# Patient Record
Sex: Female | Born: 1990 | Hispanic: Yes | Smoking: Never smoker
Health system: Southern US, Community
[De-identification: ages and names within clinical notes are randomized; demographics above are authoritative.]

## PROBLEM LIST (undated history)

## (undated) DIAGNOSIS — F419 Anxiety disorder, unspecified: Secondary | ICD-10-CM

## (undated) DIAGNOSIS — D649 Anemia, unspecified: Secondary | ICD-10-CM

## (undated) HISTORY — DX: Anemia, unspecified: D64.9

## (undated) HISTORY — DX: Anxiety disorder, unspecified: F41.9

---
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
## 2012-12-01 ENCOUNTER — Observation Stay: Payer: Self-pay | Admitting: Obstetrics and Gynecology

## 2012-12-01 LAB — URINALYSIS, COMPLETE
Bilirubin,UR: NEGATIVE
Blood: NEGATIVE
Glucose,UR: NEGATIVE mg/dL (ref 0–75)
Nitrite: NEGATIVE
Ph: 7 (ref 4.5–8.0)
Specific Gravity: 1.011 (ref 1.003–1.030)
Squamous Epithelial: 5
WBC UR: 4 /HPF (ref 0–5)

## 2012-12-03 ENCOUNTER — Observation Stay: Payer: Self-pay | Admitting: Obstetrics and Gynecology

## 2012-12-03 LAB — URINALYSIS, COMPLETE
Bilirubin,UR: NEGATIVE
Blood: NEGATIVE
Glucose,UR: NEGATIVE mg/dL (ref 0–75)
Leukocyte Esterase: NEGATIVE
Ph: 6 (ref 4.5–8.0)
Protein: 30
RBC,UR: 1 /HPF (ref 0–5)
Specific Gravity: 1.015 (ref 1.003–1.030)
Squamous Epithelial: 1
WBC UR: 2 /HPF (ref 0–5)

## 2012-12-03 IMAGING — US US RENAL KIDNEY
1 series · 14 of 25 positions shown · non-contrast
Comparison: none

REASON FOR EXAM: flank pain 36 weeks pregnant
COMMENTS:

[Series 1: us renal kidney · 0.18mm/px · 32 acquisitions, 14 frames shown]
[im 1/32]
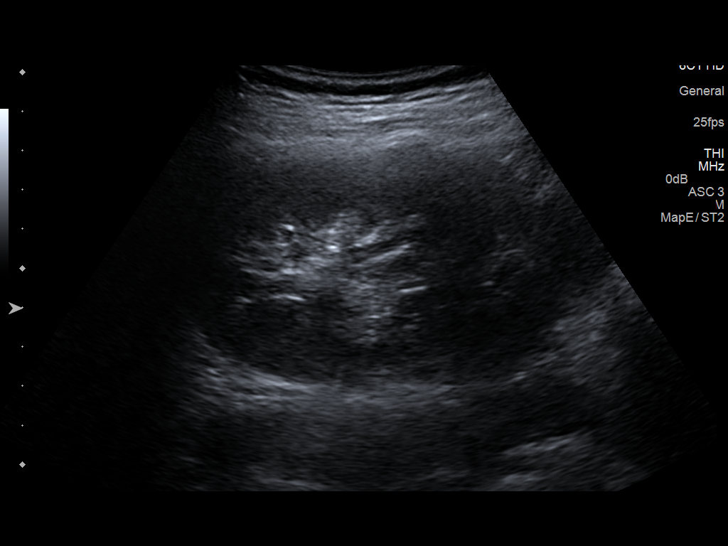
[im 3/32]
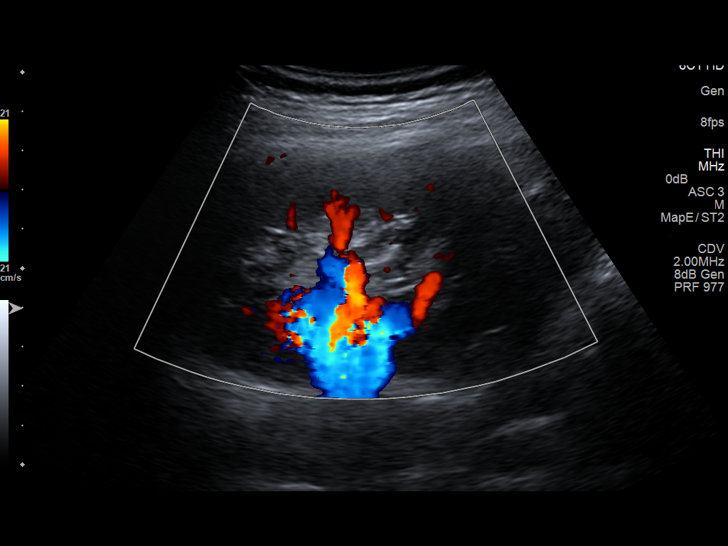
[im 6/32]
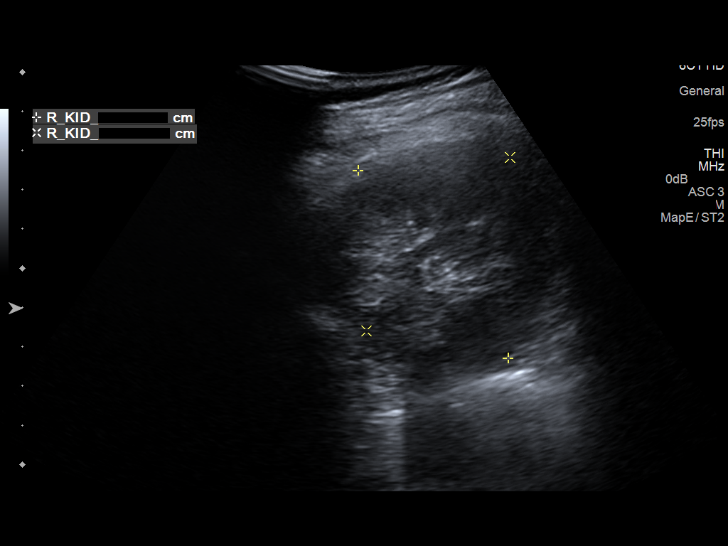
[im 8/32]
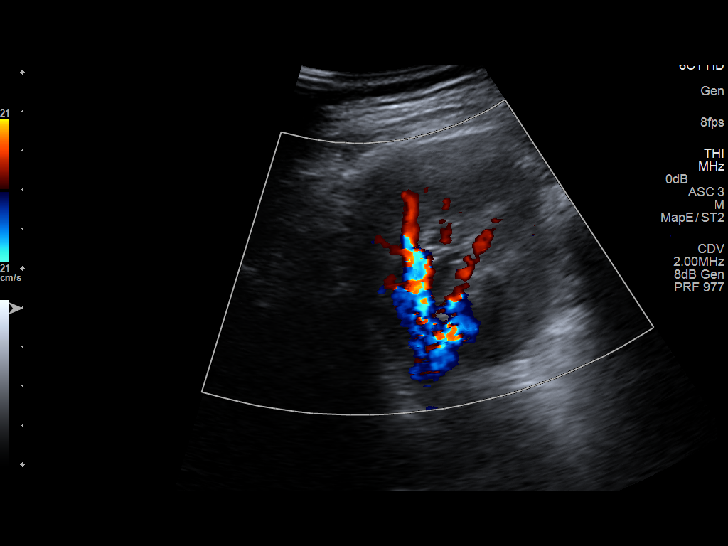
[im 11/32]
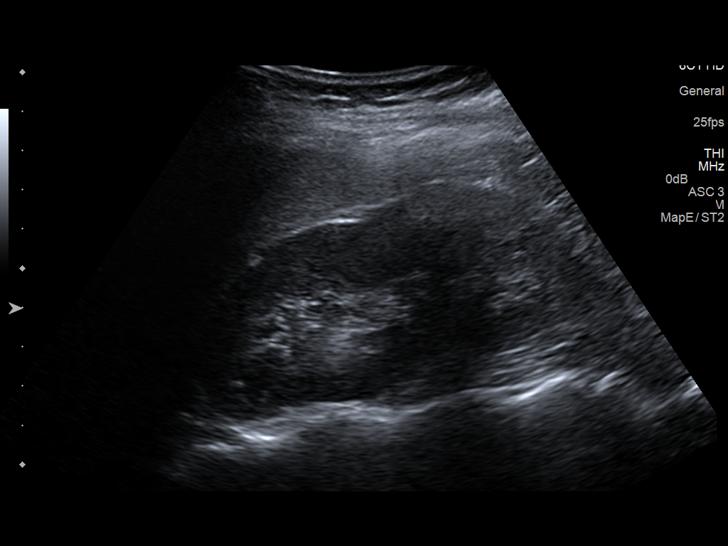
[im 12/32]
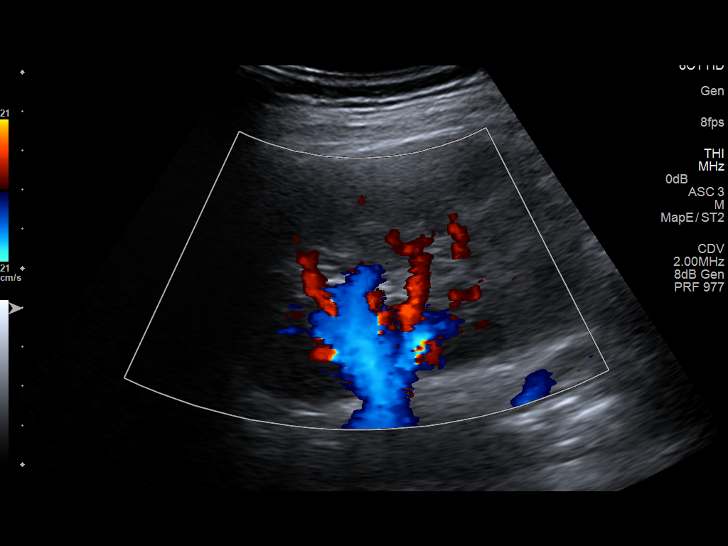
[im 15/32]
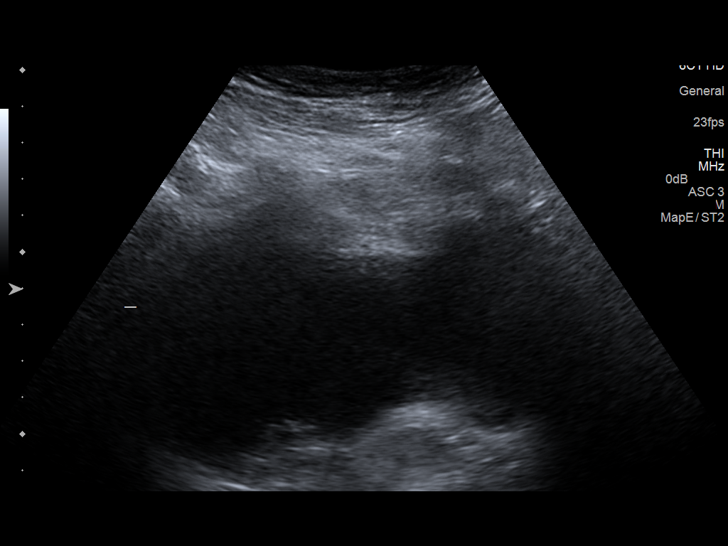
[im 17/32]
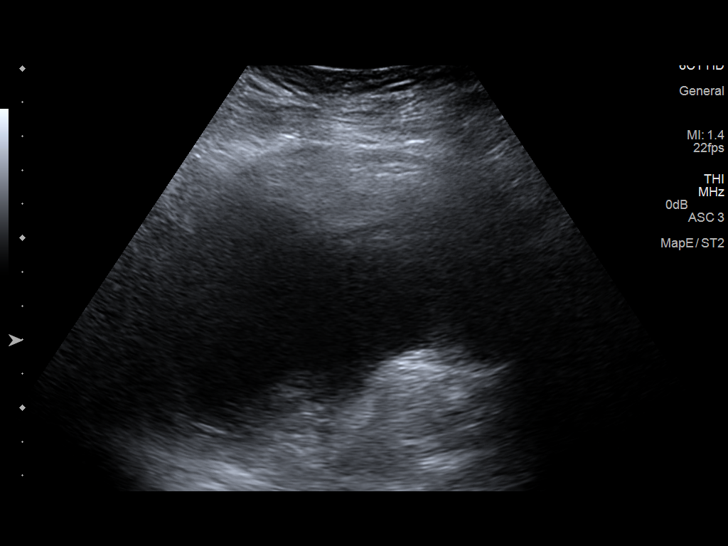
[im 20/32]
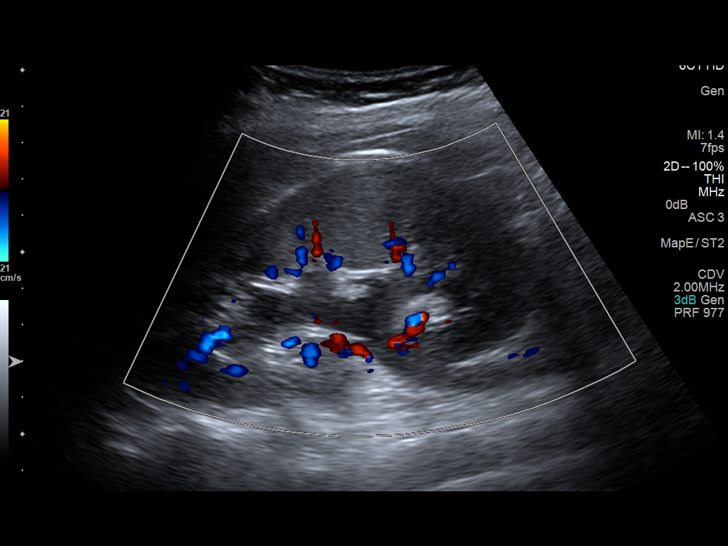
[im 21/32]
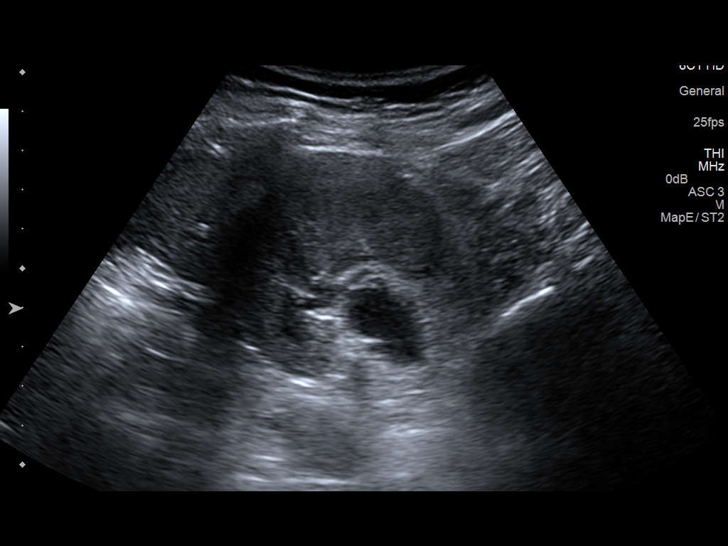
[im 24/32]
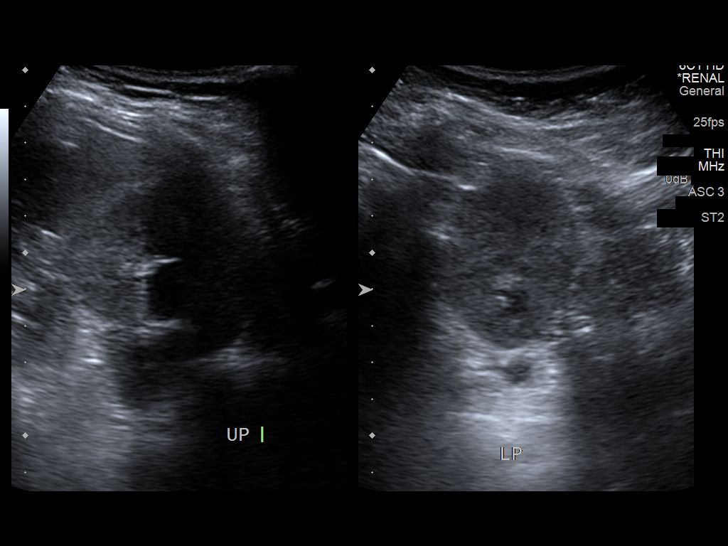
[im 26/32]
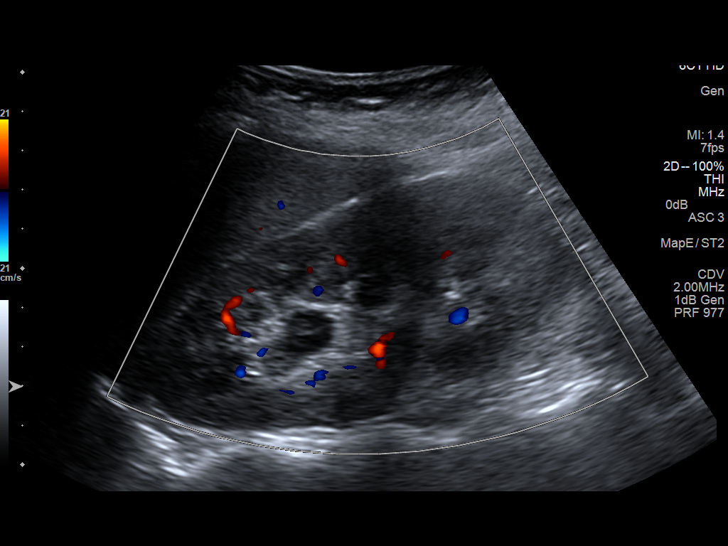
[im 29/32]
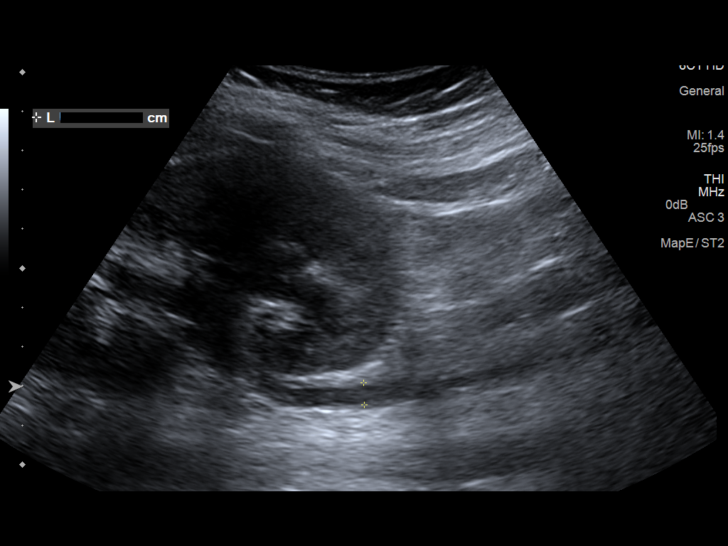
[im 32/32]
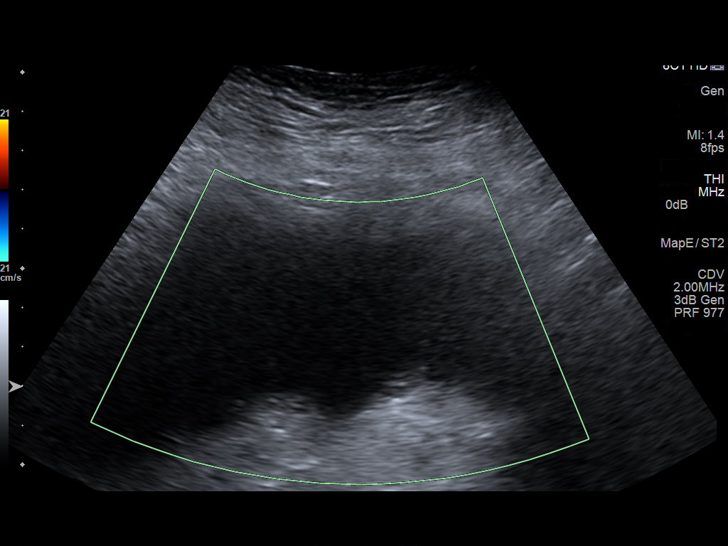

[14 of 25 positions shown; findings below may reference images not displayed]

PROCEDURE:     US  - US KIDNEY  - December 03, 2012 [DATE]

RESULT:     Comparison: None

Technique and findings: Multiple gray-scale and color doppler images of the
kidneys were obtained.

The right kidney measures 10 x 6.1 x 5.7 cm and the left kidney measures
11.3 x 5.7 x 5.9 cm. The kidneys are normal in echogenicity. There is
mild-moderate left hydronephrosis.  There are no echogenic foci.  There are
no renal masses. There is no free fluid in the region of the renal fossa.
IMPRESSION: Mild-moderate left hydronephrosis.

[REDACTED]

## 2013-01-05 ENCOUNTER — Inpatient Hospital Stay: Payer: Self-pay | Admitting: Obstetrics and Gynecology

## 2013-01-05 LAB — CBC WITH DIFFERENTIAL/PLATELET
Basophil #: 0 10*3/uL (ref 0.0–0.1)
Eosinophil #: 0.1 10*3/uL (ref 0.0–0.7)
HCT: 38.3 % (ref 35.0–47.0)
HGB: 13.2 g/dL (ref 12.0–16.0)
Lymphocyte #: 2 10*3/uL (ref 1.0–3.6)
MCH: 31.5 pg (ref 26.0–34.0)
MCV: 91 fL (ref 80–100)
Monocyte #: 0.5 x10 3/mm (ref 0.2–0.9)
Monocyte %: 6.3 %
Platelet: 98 10*3/uL — ABNORMAL LOW (ref 150–440)
RBC: 4.21 10*6/uL (ref 3.80–5.20)
RDW: 14 % (ref 11.5–14.5)

## 2013-01-05 LAB — URINALYSIS, COMPLETE
Nitrite: NEGATIVE
Ph: 7 (ref 4.5–8.0)
Protein: 100
RBC,UR: <1 /HPF (ref 0–5)
Specific Gravity: 1.011 (ref 1.003–1.030)
Squamous Epithelial: 7

## 2013-01-05 LAB — PROTEIN / CREATININE RATIO, URINE: Creatinine, Urine: 86.6 mg/dL (ref 30.0–125.0)

## 2013-01-07 LAB — HEMATOCRIT: HCT: 21.9 % — ABNORMAL LOW (ref 35.0–47.0)

## 2013-01-08 LAB — CBC WITH DIFFERENTIAL/PLATELET
Basophil %: 0.2 %
Eosinophil #: 0.2 10*3/uL (ref 0.0–0.7)
HGB: 7.4 g/dL — ABNORMAL LOW (ref 12.0–16.0)
Lymphocyte #: 1.6 10*3/uL (ref 1.0–3.6)
MCH: 31 pg (ref 26.0–34.0)
MCHC: 33.8 g/dL (ref 32.0–36.0)
MCV: 92 fL (ref 80–100)
Neutrophil #: 9 10*3/uL — ABNORMAL HIGH (ref 1.4–6.5)
Platelet: 121 10*3/uL — ABNORMAL LOW (ref 150–440)
WBC: 11.5 10*3/uL — ABNORMAL HIGH (ref 3.6–11.0)

## 2014-07-06 NOTE — H&P (Signed)
L&D Evaluation:  History:  HPI 24 yo G1P0 with LMP of 01/26/13 & EDD of 12/30/12 by L/24 with PNC at [facility] for Fe Def anemia, late entry to care at 17 wks., Teen pregnancy presents to birthplace for elevated BP's since last week, Prot/creat ratio is 327 from last week, No blurred vision, no RUQ pain or HA today. BP normalizing but, pt was due to be induced this pm so will just start IOL earlier due to BP's rising.   Presents with elevated HTN, mild pre-ex   Patient's Medical History Anemia   Patient's Surgical History none   Medications Pre Natal Vitamins   Allergies NKDA   Social History none   Family History Non-Contributory   ROS:  ROS All systems were reviewed.  HEENT, CNS, GI, GU, Respiratory, CV, Renal and Musculoskeletal systems were found to be normal.   Exam:  Vital Signs stable   General no apparent distress   Mental Status clear   Chest clear   Heart normal sinus rhythm, no murmur/gallop/rubs   Abdomen gravid, non-tender, EFW 7#14   Estimated Fetal Weight Average for gestational age   Fetal Position vtx   Back no CVAT   Edema no edema   Reflexes 1+   Clonus negative   Pelvic 1-2/50%/vtx-2   Mebranes Intact   FHT normal rate with no decels, reactive NST with 2 accels at 15 x 15 BPM   Ucx regular   Skin dry   Lymph no lymphadenopathy   Impression:  Impression early labor, reactive NST, IOL at term for pre-ecdclampsia   Plan:  Plan monitor contractions and for cervical change, Pitocin  per protocol.   Comments Dr. Feliberto GottronSchermerhorn aware and agrees wtih pllan of care   Electronic Signatures: Sharee PimpleJones, Caron W (CNM)  (Signed [id number]-14 13:40)  Authored: L&D Evaluation   Last Updated: 10-Nov-14 13:40 by Sharee PimpleJones, Caron W (CNM)

## 2014-07-06 NOTE — H&P (Signed)
L&D Evaluation:  History:  [facility] 24 [facility]   Presents with back pain, left side; \2 day duration   Patient's Medical History No Chronic Illness   Patient's Surgical History none   Medications Pre Natal Vitamins   Allergies NKDA   Social History none   Family History Non-Contributory   ROS:  ROS All systems were reviewed.  HEENT, CNS, GI, GU, Respiratory, CV, Renal and Musculoskeletal systems were found to be normal., denies dysuria   Exam:  Vital Signs stable   Abdomen gravid, non-tender   Estimated Fetal Weight Average for gestational age   Back no CVAT, left flank pain--reports radiation toward front   Edema no edema   FHT normal rate with no decels   Fetal Heart Rate 120   Ucx irregular   Other ua shows no blood   Impression:  Impression back pain unk etilolgy   Plan:  Plan EFM/NST   Comments IM morphine renal U/S if pain persists   Electronic Signatures: Margaretha GlassingEvans, Ricky L (MD)  (Signed 08-Oct-14 09:15)  Authored: L&D Evaluation   Last Updated: 08-Oct-14 09:15 by Margaretha GlassingEvans, Ricky L (MD)

## 2014-12-13 ENCOUNTER — Emergency Department
Admission: EM | Admit: 2014-12-13 | Discharge: 2014-12-13 | Disposition: A | Discharge status: Home / Self Care | Payer: BLUE CROSS/BLUE SHIELD | Attending: Emergency Medicine | Admitting: Emergency Medicine

## 2014-12-13 ENCOUNTER — Encounter: Payer: Self-pay | Admitting: Emergency Medicine

## 2014-12-13 ENCOUNTER — Emergency Department: Payer: BLUE CROSS/BLUE SHIELD

## 2014-12-13 DIAGNOSIS — O209 Hemorrhage in early pregnancy, unspecified: Secondary | ICD-10-CM | POA: Insufficient documentation

## 2014-12-13 DIAGNOSIS — O9989 Other specified diseases and conditions complicating pregnancy, childbirth and the puerperium: Secondary | ICD-10-CM | POA: Diagnosis not present

## 2014-12-13 DIAGNOSIS — R1032 Left lower quadrant pain: Secondary | ICD-10-CM | POA: Diagnosis not present

## 2014-12-13 DIAGNOSIS — Z3491 Encounter for supervision of normal pregnancy, unspecified, first trimester: Secondary | ICD-10-CM

## 2014-12-13 DIAGNOSIS — Z3A01 Less than 8 weeks gestation of pregnancy: Secondary | ICD-10-CM | POA: Insufficient documentation

## 2014-12-13 LAB — CHLAMYDIA/NGC RT PCR (ARMC ONLY)
CHLAMYDIA TR: NOT DETECTED
N GONORRHOEAE: NOT DETECTED

## 2014-12-13 LAB — WET PREP, GENITAL
CLUE CELLS WET PREP: NONE SEEN
Trich, Wet Prep: NONE SEEN
Yeast Wet Prep HPF POC: NONE SEEN

## 2014-12-13 LAB — COMPREHENSIVE METABOLIC PANEL
ALBUMIN: 4.2 g/dL (ref 3.5–5.0)
ALT: 51 U/L (ref 14–54)
ANION GAP: 4 — AB (ref 5–15)
AST: 56 U/L — ABNORMAL HIGH (ref 15–41)
Alkaline Phosphatase: 77 U/L (ref 38–126)
BUN: 7 mg/dL (ref 6–20)
CO2: 24 mmol/L (ref 22–32)
Calcium: 8.7 mg/dL — ABNORMAL LOW (ref 8.9–10.3)
Chloride: 107 mmol/L (ref 101–111)
Creatinine, Ser: 0.51 mg/dL (ref 0.44–1.00)
GFR calc Af Amer: >60 mL/min (ref >60)
GFR calc non Af Amer: >60 mL/min (ref >60)
GLUCOSE: 87 mg/dL (ref 65–99)
POTASSIUM: 3.2 mmol/L — AB (ref 3.5–5.1)
SODIUM: 135 mmol/L (ref 135–145)
TOTAL PROTEIN: 7.2 g/dL (ref 6.5–8.1)
Total Bilirubin: 0.5 mg/dL (ref 0.3–1.2)

## 2014-12-13 LAB — URINALYSIS COMPLETE WITH MICROSCOPIC (ARMC ONLY)
Bacteria, UA: NONE SEEN
Bilirubin Urine: NEGATIVE
Glucose, UA: NEGATIVE mg/dL
HGB URINE DIPSTICK: NEGATIVE
KETONES UR: NEGATIVE mg/dL
LEUKOCYTES UA: NEGATIVE
Nitrite: NEGATIVE
PH: 6 (ref 5.0–8.0)
PROTEIN: NEGATIVE mg/dL
SPECIFIC GRAVITY, URINE: 1.026 (ref 1.005–1.030)

## 2014-12-13 LAB — CBC
HEMATOCRIT: 36.9 % (ref 35.0–47.0)
HEMOGLOBIN: 12.4 g/dL (ref 12.0–16.0)
MCH: 30.2 pg (ref 26.0–34.0)
MCHC: 33.6 g/dL (ref 32.0–36.0)
MCV: 89.7 fL (ref 80.0–100.0)
Platelets: 236 10*3/uL (ref 150–440)
RBC: 4.11 MIL/uL (ref 3.80–5.20)
RDW: 13 % (ref 11.5–14.5)
WBC: 5.5 10*3/uL (ref 3.6–11.0)

## 2014-12-13 LAB — HCG, QUANTITATIVE, PREGNANCY: hCG, Beta Chain, Quant, S: 1218 m[IU]/mL — ABNORMAL HIGH (ref <5)

## 2014-12-13 IMAGING — US US OB COMP LESS 14 WK
1 series · 13 of 28 positions shown · non-contrast
Comparison: None.

CLINICAL DATA: Left lower quadrant abdominal pain for 2 weeks.
Positive pregnancy test with a quantitative beta HCG of [DATE].

EXAM:
OBSTETRIC <14 WK US AND TRANSVAGINAL OB US
TECHNIQUE: Both transabdominal and transvaginal ultrasound examinations were
performed for complete evaluation of the gestation as well as the
maternal uterus, adnexal regions, and pelvic cul-de-sac.
Transvaginal technique was performed to assess early pregnancy.

[Series 1: us ob comp less 14 wk · 0.18mm/px · 13 of 160 slices shown]
[im 6/160]
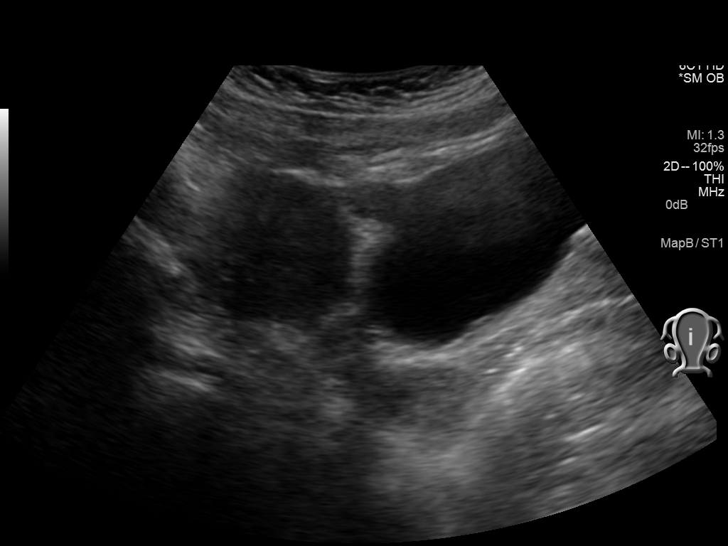
[im 18/160]
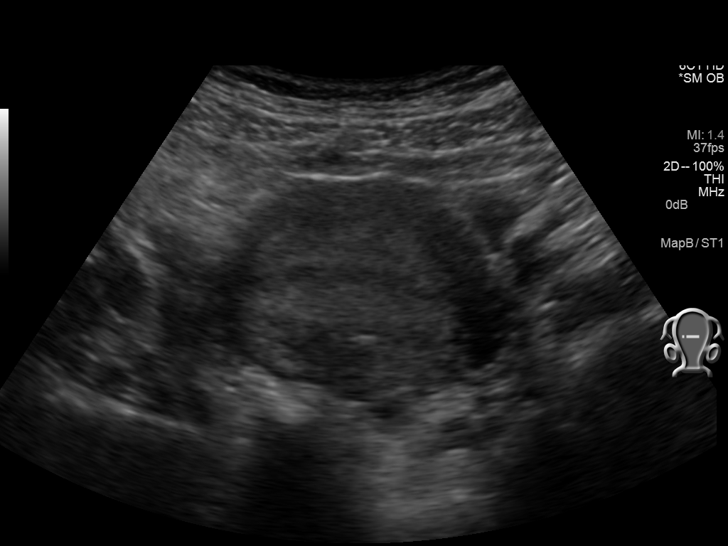
[im 30/160]
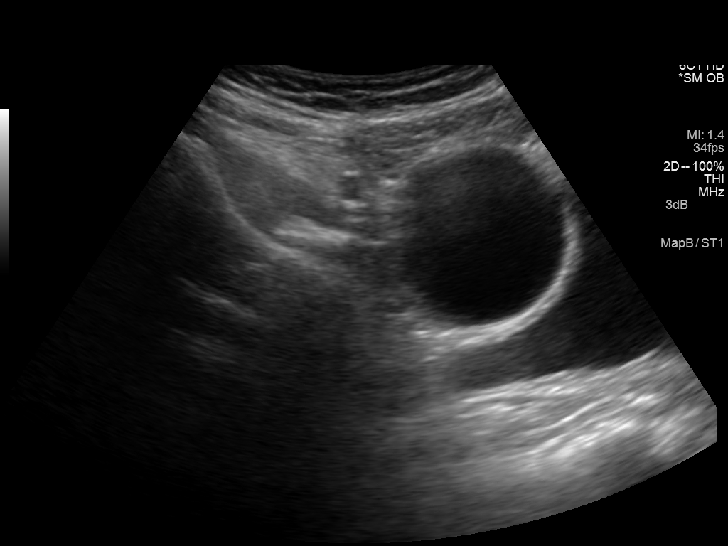
[im 42/160]
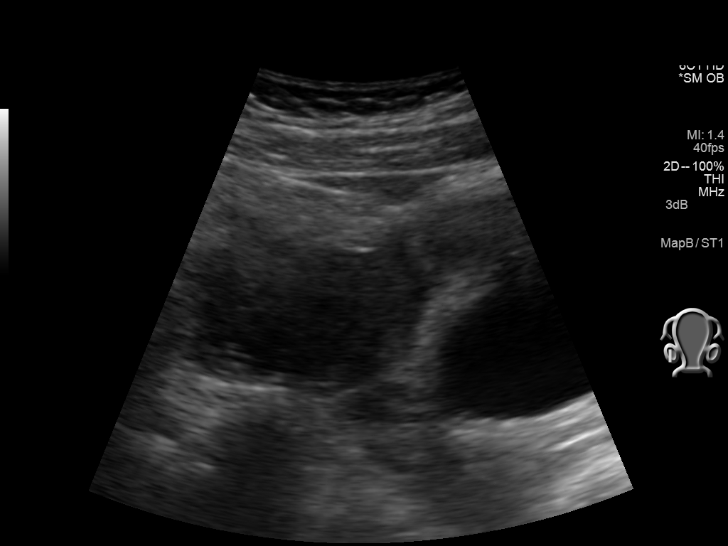
[im 54/160]
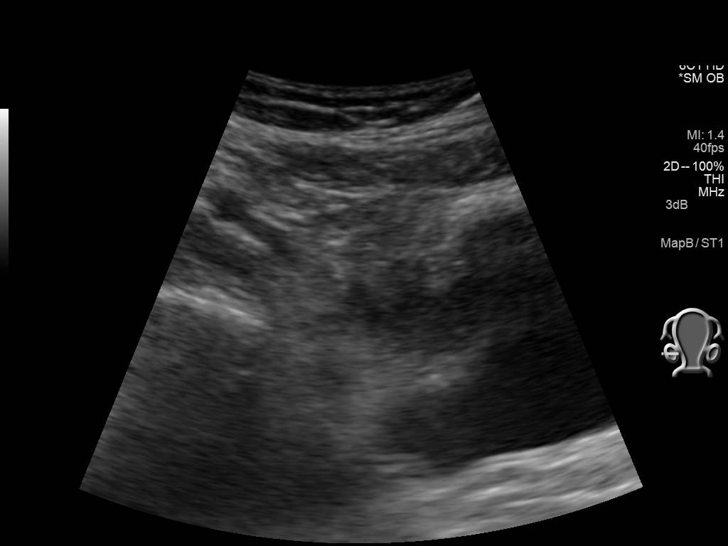
[im 65/160]
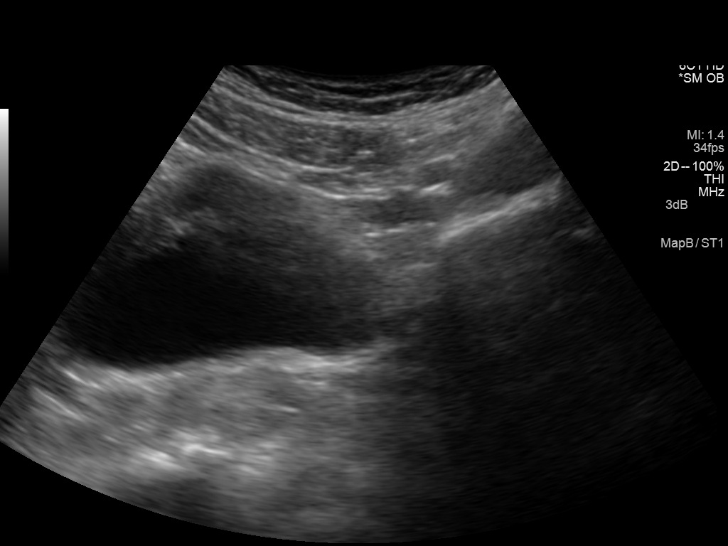
[im 83/160]
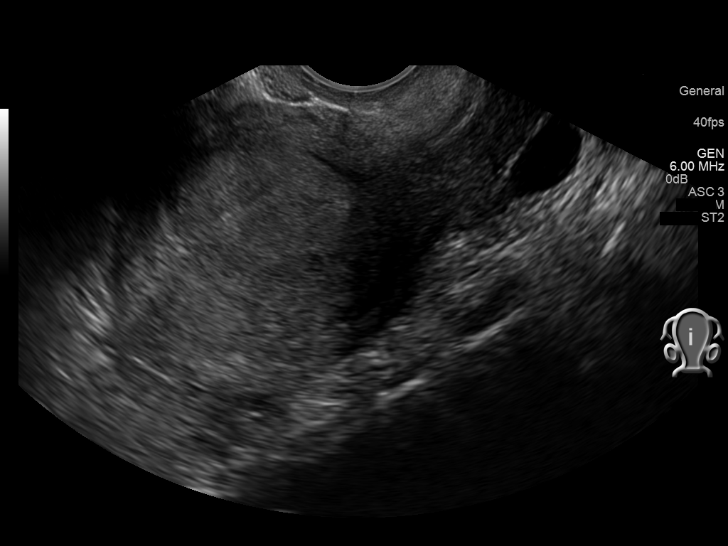
[im 95/160]
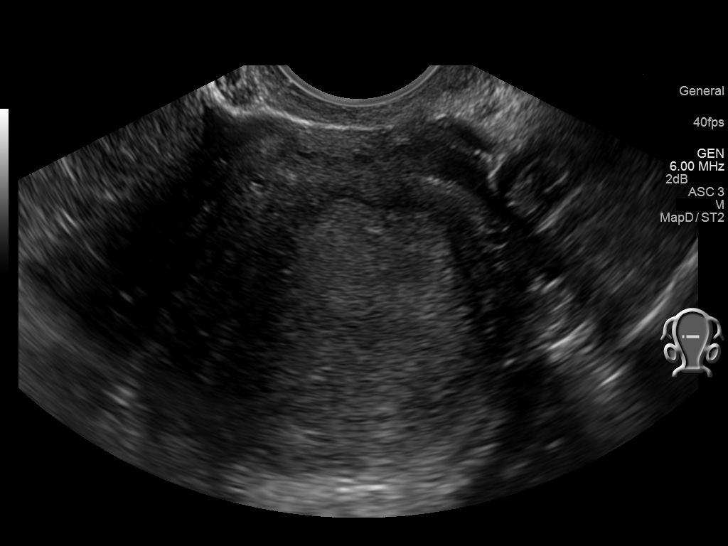
[im 107/160]
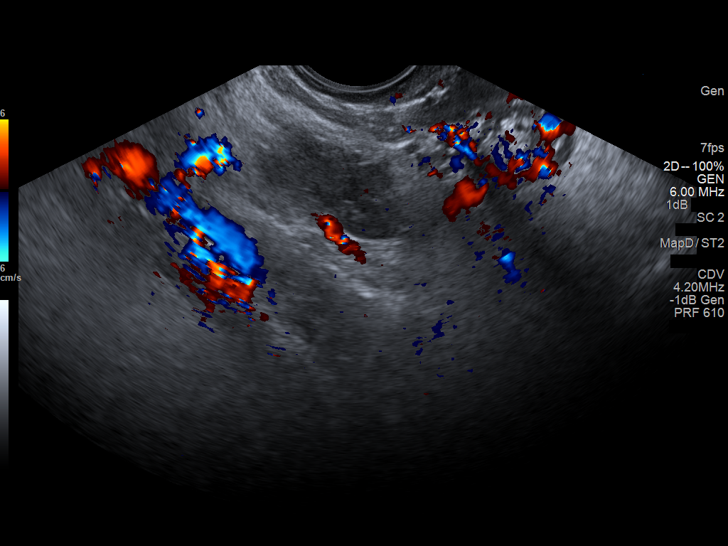
[im 118/160]
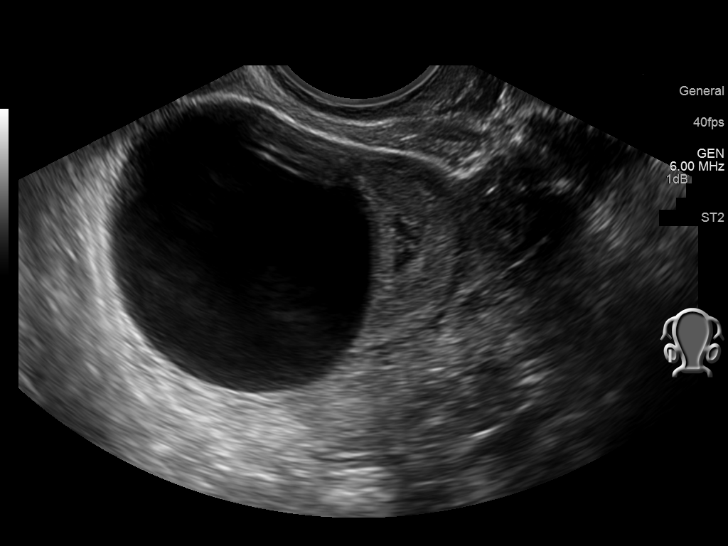
[im 130/160]
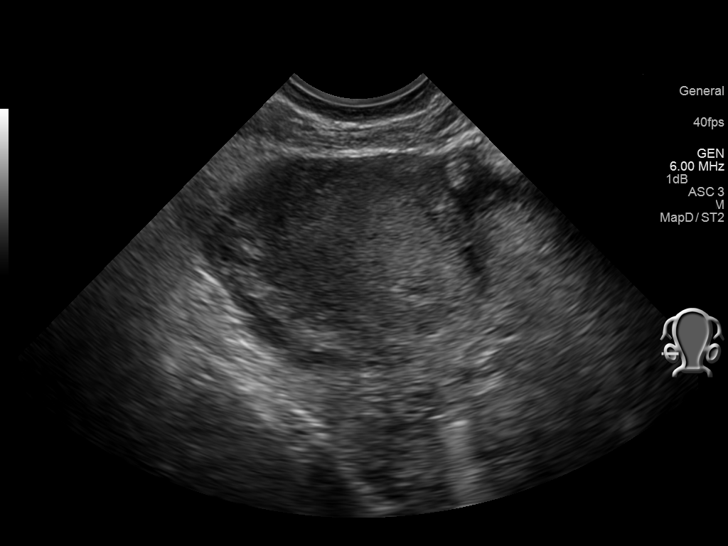
[im 142/160]
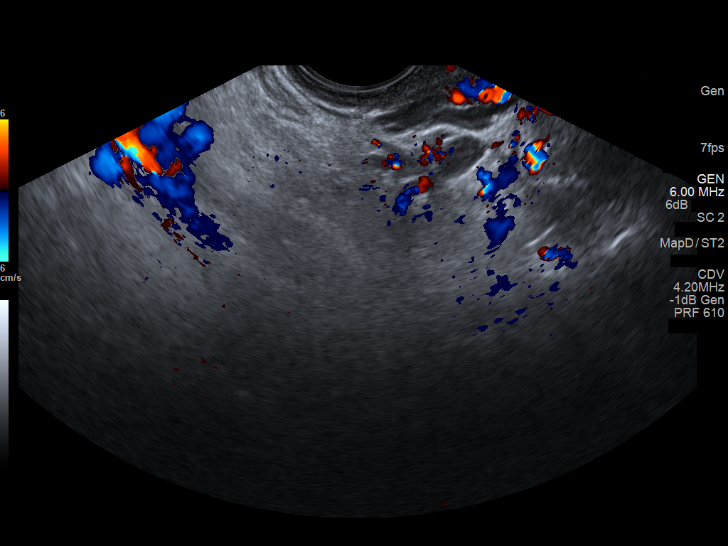
[im 154/160]
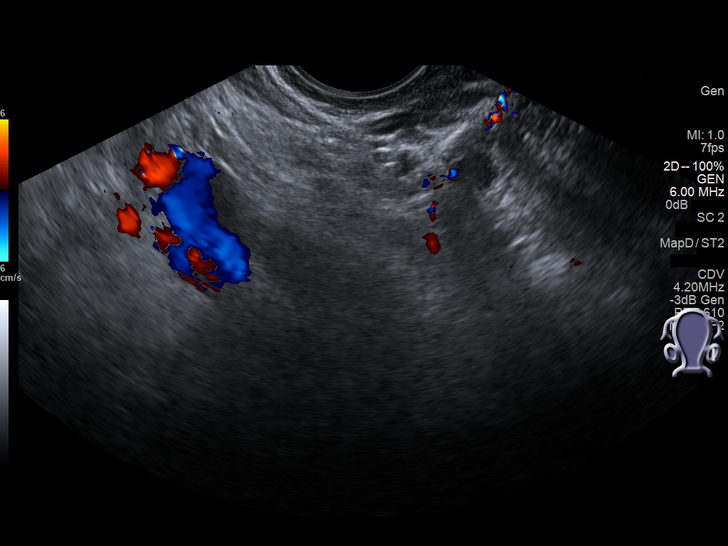

[13 of 28 positions shown; findings below may reference images not displayed]

FINDINGS: Intrauterine gestational sac: None visualized

Maternal uterus/adnexae: Endometrium is thickened measuring up to 27
mm. There is no significant fluid within the endometrial canal.

A cyst in the right ovary measures 3.9 x 4.2 x 4.2 cm, likely
representing a corpus luteal cyst. The left ovary is not visualized.
IMPRESSION: 1. No intrauterine pregnancy identified. Soft tissue within the
endometrial canal may represent an involuted pregnancy or less
likely hemorrhage. Findings are suspicious but not yet definitive
for failed pregnancy. Recommend follow-up US in 10-14 days for
definitive diagnosis. This recommendation follows SRU consensus
guidelines: Diagnostic Criteria for Nonviable Pregnancy Early in the
First Trimester. N Engl J Med 3404; [DATE].
2. 4.2 cm cystic lesion in the right ovary most likely representing
a corpus luteal cyst. No definite ectopic pregnancy is visualized.

## 2014-12-13 NOTE — Discharge Instructions (Signed)
Return to the emergency room for any worsening pain, vaginal bleeding, dizziness or passing out.  Take over-the-counter prenatal vitamin.  Follow-up with OB/GYN, Dr.Schermerhorm, or the health department.  First Trimester of Pregnancy The first trimester of pregnancy is from week 1 until the end of week 12 (months 1 through 3). A week after a sperm fertilizes an egg, the egg will implant on the wall of the uterus. This embryo will begin to develop into a baby. Genes from you and your partner are forming the baby. The female genes determine whether the baby is a boy or a girl. At 6-8 weeks, the eyes and face are formed, and the heartbeat can be seen on ultrasound. At the end of 12 weeks, all the baby's organs are formed.  Now that you are pregnant, you will want to do everything you can to have a healthy baby. Two of the most important things are to get good prenatal care and to follow your health care provider's instructions. Prenatal care is all the medical care you receive before the baby's birth. This care will help prevent, find, and treat any problems during the pregnancy and childbirth. BODY CHANGES Your body goes through many changes during pregnancy. The changes vary from woman to woman.   You may gain or lose a couple of pounds at first.  You may feel sick to your stomach (nauseous) and throw up (vomit). If the vomiting is uncontrollable, call your health care provider.  You may tire easily.  You may develop headaches that can be relieved by medicines approved by your health care provider.  You may urinate more often. Painful urination may mean you have a bladder infection.  You may develop heartburn as a result of your pregnancy.  You may develop constipation because certain hormones are causing the muscles that push waste through your intestines to slow down.  You may develop hemorrhoids or swollen, bulging veins (varicose veins).  Your breasts may begin to grow larger and become  tender. Your nipples may stick out more, and the tissue that surrounds them (areola) may become darker.  Your gums may bleed and may be sensitive to brushing and flossing.  Dark spots or blotches (chloasma, mask of pregnancy) may develop on your face. This will likely fade after the baby is born.  Your menstrual periods will stop.  You may have a loss of appetite.  You may develop cravings for certain kinds of food.  You may have changes in your emotions from day to day, such as being excited to be pregnant or being concerned that something may go wrong with the pregnancy and baby.  You may have more vivid and strange dreams.  You may have changes in your hair. These can include thickening of your hair, rapid growth, and changes in texture. Some women also have hair loss during or after pregnancy, or hair that feels dry or thin. Your hair will most likely return to normal after your baby is born. WHAT TO EXPECT AT YOUR PRENATAL VISITS During a routine prenatal visit:  You will be weighed to make sure you and the baby are growing normally.  Your blood pressure will be taken.  Your abdomen will be measured to track your baby's growth.  The fetal heartbeat will be listened to starting around week 10 or 12 of your pregnancy.  Test results from any previous visits will be discussed. Your health care provider may ask you:  How you are feeling.  If you are  feeling the baby move.  If you have had any abnormal symptoms, such as leaking fluid, bleeding, severe headaches, or abdominal cramping.  If you are using any tobacco products, including cigarettes, chewing tobacco, and electronic cigarettes.  If you have any questions. Other tests that may be performed during your first trimester include:  Blood tests to find your blood type and to check for the presence of any previous infections. They will also be used to check for low iron levels (anemia) and Rh antibodies. Later in the  pregnancy, blood tests for diabetes will be done along with other tests if problems develop.  Urine tests to check for infections, diabetes, or protein in the urine.  An ultrasound to confirm the proper growth and development of the baby.  An amniocentesis to check for possible genetic problems.  Fetal screens for spina bifida and Down syndrome.  You may need other tests to make sure you and the baby are doing well.  HIV (human immunodeficiency virus) testing. Routine prenatal testing includes screening for HIV, unless you choose not to have this test. HOME CARE INSTRUCTIONS  Medicines  Follow your health care provider's instructions regarding medicine use. Specific medicines may be either safe or unsafe to take during pregnancy.  Take your prenatal vitamins as directed.  If you develop constipation, try taking a stool softener if your health care provider approves. Diet  Eat regular, well-balanced meals. Choose a variety of foods, such as meat or vegetable-based protein, fish, milk and low-fat dairy products, vegetables, fruits, and whole grain breads and cereals. Your health care provider will help you determine the amount of weight gain that is right for you.  Avoid raw meat and uncooked cheese. These carry germs that can cause birth defects in the baby.  Eating four or five small meals rather than three large meals a day may help relieve nausea and vomiting. If you start to feel nauseous, eating a few soda crackers can be helpful. Drinking liquids between meals instead of during meals also seems to help nausea and vomiting.  If you develop constipation, eat more high-fiber foods, such as fresh vegetables or fruit and whole grains. Drink enough fluids to keep your urine clear or pale yellow. Activity and Exercise  Exercise only as directed by your health care provider. Exercising will help you:  Control your weight.  Stay in shape.  Be prepared for labor and  delivery.  Experiencing pain or cramping in the lower abdomen or low back is a good sign that you should stop exercising. Check with your health care provider before continuing normal exercises.  Try to avoid standing for long periods of time. Move your legs often if you must stand in one place for a long time.  Avoid heavy lifting.  Wear low-heeled shoes, and practice good posture.  You may continue to have sex unless your health care provider directs you otherwise. Relief of Pain or Discomfort  Wear a good support bra for breast tenderness.   Take warm sitz baths to soothe any pain or discomfort caused by hemorrhoids. Use hemorrhoid cream if your health care provider approves.   Rest with your legs elevated if you have leg cramps or low back pain.  If you develop varicose veins in your legs, wear support hose. Elevate your feet for 15 minutes, 3-4 times a day. Limit salt in your diet. Prenatal Care  Schedule your prenatal visits by the twelfth week of pregnancy. They are usually scheduled monthly at first, then  more often in the last 2 months before delivery.  Write down your questions. Take them to your prenatal visits.  Keep all your prenatal visits as directed by your health care provider. Safety  Wear your seat belt at all times when driving.  Make a list of emergency phone numbers, including numbers for family, friends, the hospital, and police and fire departments. General Tips  Ask your health care provider for a referral to a local prenatal education class. Begin classes no later than at the beginning of month 6 of your pregnancy.  Ask for help if you have counseling or nutritional needs during pregnancy. Your health care provider can offer advice or refer you to specialists for help with various needs.  Do not use hot tubs, steam rooms, or saunas.  Do not douche or use tampons or scented sanitary pads.  Do not cross your legs for long periods of time.  Avoid  cat litter boxes and soil used by cats. These carry germs that can cause birth defects in the baby and possibly loss of the fetus by miscarriage or stillbirth.  Avoid all smoking, herbs, alcohol, and medicines not prescribed by your health care provider. Chemicals in these affect the formation and growth of the baby.  Do not use any tobacco products, including cigarettes, chewing tobacco, and electronic cigarettes. If you need help quitting, ask your health care provider. You may receive counseling support and other resources to help you quit.  Schedule a dentist appointment. At home, brush your teeth with a soft toothbrush and be gentle when you floss. SEEK MEDICAL CARE IF:   You have dizziness.  You have mild pelvic cramps, pelvic pressure, or nagging pain in the abdominal area.  You have persistent nausea, vomiting, or diarrhea.  You have a bad smelling vaginal discharge.  You have pain with urination.  You notice increased swelling in your face, hands, legs, or ankles. SEEK IMMEDIATE MEDICAL CARE IF:   You have a fever.  You are leaking fluid from your vagina.  You have spotting or bleeding from your vagina.  You have severe abdominal cramping or pain.  You have rapid weight gain or loss.  You vomit blood or material that looks like coffee grounds.  You are exposed to [place] measles and have never had them.  You are exposed to fifth disease or chickenpox.  You develop a severe headache.  You have shortness of breath.  You have any kind of trauma, such as from a fall or a car accident.   This information is not intended to replace advice given to you by your health care provider. Make sure you discuss any questions you have with your health care provider.   Document Released: 02/06/2001 Document Revised: 03/05/2014 Document Reviewed: 12/23/2012 Elsevier Interactive Patient Education 2016 [employer].   Primer trimestre de Psychiatrist (First Trimester of  Pregnancy) El primer trimestre de Psychiatrist se extiende desde la semana1 hasta el final de la semana12 (mes1 al mes3). Durante este tiempo, el beb comenzar a desarrollarse dentro suyo. Entre la semana6 y [place], se forman los ojos y [place], y los latidos del corazn pueden verse en la ecografa. Al final de las 12semanas, todos los rganos del beb estn formados. La atencin prenatal es toda la asistencia mdica que usted recibe antes del nacimiento del beb. Asegrese de recibir una buena atencin prenatal y de seguir todas las indicaciones del mdico. CUIDADOS EN EL HOGAR  Medicamentos:  Tome los [employer] se  lo haya indicado el mdico. Algunos medicamentos se pueden tomar durante el embarazo y otros no.  Tome las vitaminas prenatales como se lo haya indicado el mdico.  Tome el medicamento que la ayuda a Advertising copywriter (laxante [place]) segn sea necesario, si el mdico lo autoriza. Dieta  Ingiera alimentos saludables de [place] regular.  El Firefighter la cantidad de peso que [place].  No coma carne cruda ni quesos sin cocinar.  Si tiene Programme researcher, broadcasting/film/video (nuseas) o vomita:  Ingiera 4 o 5comidas pequeas por Geophysical data processor de 3abundantes.  Intente comer algunas galletitas saladas.  Beba lquidos [employer], en lugar de Sports coach.  Si tiene dificultad para defecar (estreimiento):  Consuma alimentos con alto contenido de [place], como verduras y frutas frescos, y Radiation protection practitioner.  Beba suficiente lquido para mantener el pis (orina) claro o de color amarillo plido. Actividad y ejercicios  Haga ejercicios solamente como se lo haya indicado el mdico. Deje de hacer ejercicios si tiene clicos o dolor en la parte baja del vientre (abdomen) o en la cintura.  Intente no estar de pie [employer]. Mueva las piernas con frecuencia si debe estar de pie en un lugar durante mucho tiempo.  Evite levantar pesos  [employer].  Use zapatos con tacones bajos. Mantenga una buena postura al sentarse y pararse.  Puede tener [employer] [place] [employer], a menos que el mdico le indique lo contrario. Alivio del dolor o las molestias  Use un sostn que le brinde buen soporte si le duelen las [place].  Dese baos con agua tibia (baos de asiento) para Engineer, materials o las molestias a causa de las hemorroides. Use crema antihemorroidal si el mdico se lo permite.  Descanse con las piernas elevadas si tiene calambres o dolor de cintura.  Use medias de descanso si tiene las venas de las piernas hinchadas y abultadas (venas varicosas). Eleve los pies durante , 3 o 4veces por da. Limite la cantidad de sal en su dieta. Cuidados prenatales  Programe las visitas prenatales para la semana12 de [place].  Escriba sus preguntas. Llvelas cuando concurra a las visitas prenatales.  Concurra a todas las visitas prenatales como se lo haya indicado el mdico. Seguridad  Colquese el cinturn de seguridad cuando conduzca.  Haga una lista con los nmeros de telfono en caso de Associate Professor, en la cual deben incluirse los nmeros de los familiares, los amigos, el hospital y los departamentos de polica y de bomberos. Consejos generales  Pdale al mdico que la derive a clases prenatales en su localidad. Debe comenzar a tomar las clases antes de Cytogeneticist en el mes6 de embarazo.  Pida ayuda si necesita asesoramiento o asistencia con la alimentacin. El mdico puede aconsejarla o indicarle dnde recurrir para recibir [place].  No se d baos de inmersin en agua caliente, baos turcos ni saunas.  No se haga duchas vaginales ni use tampones o toallas higinicas perfumadas.  No mantenga las piernas cruzadas durante [place].  Evite el contacto con las bandejas sanitarias de los gatos y la tierra que estos animales usan.  No fume, no consuma hierbas ni beba alcohol. No tome frmacos que el mdico no haya  autorizado.  No consuma ningn producto que contenga tabaco, lo que incluye cigarrillos, tabaco de Theatre manager o Administrator, Civil Service. Si necesita ayuda para dejar de fumar, consulte al [employer] Express. Puede recibir asesoramiento u otro tipo de apoyo para dejar de fumar.  Visite al dentista. En su casa, lvese los dientes con un  cepillo dental suave. Psese el hilo dental con suavidad. SOLICITE AYUDA SI:  Tiene mareos.  Tiene clicos leves o siente presin en la parte baja del vientre.  Siente un dolor persistente en la zona del vientre.  Sigue teniendo AT&Tmalestar estomacal, vomita o las heces son lquidas (diarrea).  Observa una secrecin, con mal olor que proviene de la vagina.  Siente dolor al ConocoPhillipsorinar.  Tiene el rostro, las Pownal Centermanos, las piernas o los tobillos ms hinchados (inflamados). SOLICITE AYUDA DE INMEDIATO SI:   Tiene fiebre.  Tiene una prdida de lquido por la vagina.  Tiene sangrado o pequeas prdidas vaginales.  Tiene clicos o dolor muy intensos en el vientre.  Sube o baja de peso rpidamente.  Vomita sangre. Puede ser similar a la borra del caf  Est en contacto con personas que tienen rubola, la quinta enfermedad o varicela.  Siente un dolor de cabeza muy intenso.  Le falta el aire.  Sufre cualquier tipo de traumatismo, por ejemplo, debido a una cada o un accidente automovilstico.   Esta informacin no tiene Theme park managercomo fin reemplazar el consejo del mdico. Asegrese de hacerle al mdico cualquier pregunta que tenga.   Document Released: 05/11/2008 Document Revised: 03/05/2014 Elsevier Interactive Patient Education [employer]2016 Elsevier Inc.

## 2014-12-13 NOTE — ED Provider Notes (Signed)
[facility] Emergency Department Provider Note   ____________________________________________  Time seen:  I have reviewed the triage vital signs and the triage nursing note.  HISTORY  Chief Complaint Possible Pregnancy   Historian Patient and husband  HPI Jeanne [name] is a 24 y.o. female G2 P1 who is here for evaluation of left lower quadrant pain and positive pregnancy test at home.  Pain is mild and intermittent. She is wanting to know how far along the pregnancy is, and whether or not the x-ray that she had in her wrist several weeks ago is likely to have harmed the fetus.No vaginal bleeding.  She is planning have x-ray on what appears to be a cyst on her right wrist, and she is concerned that she may not have the surgery if she is pregnant.    History reviewed. No pertinent past medical history. G 2P1, left wrist cyst  There are no active problems to display for this patient.   History reviewed. No pertinent past surgical history.  No current outpatient prescriptions on file.  Allergies Review of patient's allergies indicates no known allergies.  No family history on file.  Social History Social History  Substance Use Topics  . Smoking status: Never Smoker   . Smokeless tobacco: Never Used  . Alcohol Use: No    Review of Systems  Constitutional: Negative for fever. Eyes: Negative for visual changes. ENT: Negative for sore throat. Cardiovascular: Negative for chest pain. Respiratory: Negative for shortness of breath. Gastrointestinal: Negative for vomiting and diarrhea. Genitourinary: Negative for dysuria. Musculoskeletal: Negative for back pain. Skin: Negative for rash. Neurological: Negative for headache. 10 point Review of Systems otherwise negative ____________________________________________   PHYSICAL EXAM:  VITAL SIGNS: ED Triage Vitals  Enc Vitals Group     BP 12/13/14 1303 112/50 mmHg     Pulse Rate  12/13/14 1303 83     Resp 12/13/14 1303 17     Temp 12/13/14 1303 98.5 F (36.9 C)     Temp Source 12/13/14 1303 Oral     SpO2 12/13/14 1303 97 %     Weight 12/13/14 1303 105 lb (47.628 kg)     Height 12/13/14 1303 5' (1.524 m)     Head Cir --      Peak Flow --      Pain Score 12/13/14 1314 0     Pain Loc --      Pain Edu? --      Excl. in GC? --      Constitutional: Alert and oriented. Well appearing and in no distress. Eyes: Conjunctivae are normal. PERRL. Normal extraocular movements. ENT   Head: Normocephalic and atraumatic.   Nose: No congestion/rhinnorhea.   Mouth/Throat: Mucous membranes are moist.   Neck: No stridor. Cardiovascular/Chest: Normal rate, regular rhythm.  No murmurs, rubs, or gallops. Respiratory: Normal respiratory effort without tachypnea nor retractions. Breath sounds are clear and equal bilaterally. No wheezes/rales/rhonchi. Gastrointestinal: Soft. No distention, no guarding, no rebound. Mild tenderness left lower quadrant.  Genitourinary/rectal:  Mild vaginal discharge, no bleeding.  No cervicitis on exam. Musculoskeletal: Nontender with normal range of motion in all extremities. No joint effusions.  No lower extremity tenderness.  No edema. Neurologic:  Normal speech and language. No gross or focal neurologic deficits are appreciated. Skin:  Skin is warm, dry and intact. No rash noted. Psychiatric: Mood and affect are normal. Speech and behavior are normal. Patient exhibits appropriate insight and judgment.  ____________________________________________   EKG I,  Governor Rooksebecca Rupal Childress, MD, the attending physician have personally viewed and interpreted all ECGs.  No EKG performed ____________________________________________  LABS (pertinent positives/negatives)  Comments metabolic panel significant for potassium 3.2 and otherwise without significant abnormalities CBC within normal limits Urinalysis negative Beta hCG  1218 Wet prep few white  blood cells and otherwise negative  ____________________________________________  RADIOLOGY All Xrays were viewed by me. Imaging interpreted by Radiologist.  Ultrasound pelvic and transvaginal: Pending __________________________________________  PROCEDURES  Procedure(s) performed: None  Critical Care performed: None  ____________________________________________   ED COURSE / ASSESSMENT AND PLAN  CONSULTATIONS: None  Pertinent labs & imaging results that were available during my care of the patient were reviewed by me and considered in my medical decision making (see chart for details).   Patient is having minor left lower quadrant discomfort, however in new pregnancy, I discussed with her checking ultrasound.  Patient care transferred to PA Cuthriell for follow up ultrasound result.  Patient / Family / Caregiver informed of clinical course, medical decision-making process, and agree with plan.    ___________________________________________   FINAL CLINICAL IMPRESSION(S) / ED DIAGNOSES   Final diagnoses:  LLQ pain  First trimester pregnancy       Governor Rooksebecca Jamoni Broadfoot, MD 12/13/14 1600

## 2014-12-13 NOTE — ED Provider Notes (Signed)
-----------------------------------------   5:08 PM on 12/13/2014 -----------------------------------------   Blood pressure 112/50, pulse 83, temperature 98.5 F (36.9 C), temperature source Oral, resp. rate 17, height 5' (1.524 m), weight 105 lb (47.628 kg), last menstrual period 10/25/2014, SpO2 97 %.  Assuming care from Dr. Shaune PollackLord.  In short, Jeanne [name] is a 24 y.o. female with a chief complaint of Possible Pregnancy .  Refer to the original H&P for additional details.  Imaging Comprehensive OB ultrasound with transvaginal Impression: No intrauterine pregnancy identified. Soft tissue within the endometrial canal may represent an involuting pregnancy or less likely hemorrhage. Findings are suspicious but not definitive for fell pregnancy. Recommend follow-up ultrasound 10-14 days for definitive diagnosis  The current plan of care is to discharge the patient home with a follow-up with OB/GYN. The patient care is assumed from Dr. Shaune PollackLord, pending ultrasound results. The results of the ultrasound returned with no visible intrauterine pregnancy identified. There is tissue within the endometrial canal that needs follow-up with a repeat ultrasound in 10-14 days. I discussed findings with the patient and recommended that the patient follow-up with OB/GYN tomorrow to schedule an appointment for 2 weeks for further evaluation. Patient verbalizes understanding of same. The patient is advised to take Tylenol for left lower quadrant intermittent pain. Advised the patient not to take any other medications until pregnancy status is fully determined. Verbalizes understanding of same.  Delorise RoyalsJonathan D Khris Jansson, PA-C 12/13/14 1725  Myrna Blazeravid Matthew Schaevitz, MD 12/13/14 2157

## 2014-12-13 NOTE — ED Notes (Signed)
States was seen in ED last month for a wrist injury at work and had a X-ray.  Patient has since taken a home pregnancy test (yesterday) and is pregnant.  Patient is concerned about how far along the pregnancy is and if she was pregnant when she had the X-ray.

## 2015-02-09 ENCOUNTER — Emergency Department: Payer: BLUE CROSS/BLUE SHIELD | Admitting: Anesthesiology

## 2015-02-09 ENCOUNTER — Emergency Department
Admission: EM | Admit: 2015-02-09 | Discharge: 2015-02-09 | Disposition: A | Discharge status: Home / Self Care | Payer: BLUE CROSS/BLUE SHIELD | Attending: Emergency Medicine | Admitting: Emergency Medicine

## 2015-02-09 ENCOUNTER — Emergency Department: Payer: BLUE CROSS/BLUE SHIELD

## 2015-02-09 ENCOUNTER — Encounter
Admission: EM | Disposition: A | Discharge status: Home / Self Care | Payer: Self-pay | Source: Home / Self Care | Attending: Emergency Medicine

## 2015-02-09 DIAGNOSIS — O034 Incomplete spontaneous abortion without complication: Secondary | ICD-10-CM | POA: Diagnosis present

## 2015-02-09 DIAGNOSIS — R1032 Left lower quadrant pain: Secondary | ICD-10-CM | POA: Insufficient documentation

## 2015-02-09 DIAGNOSIS — I959 Hypotension, unspecified: Secondary | ICD-10-CM | POA: Diagnosis not present

## 2015-02-09 DIAGNOSIS — O081 Delayed or excessive hemorrhage following ectopic and molar pregnancy: Secondary | ICD-10-CM | POA: Diagnosis not present

## 2015-02-09 DIAGNOSIS — R42 Dizziness and giddiness: Secondary | ICD-10-CM | POA: Diagnosis not present

## 2015-02-09 DIAGNOSIS — O039 Complete or unspecified spontaneous abortion without complication: Secondary | ICD-10-CM

## 2015-02-09 DIAGNOSIS — O021 Missed abortion: Secondary | ICD-10-CM | POA: Diagnosis not present

## 2015-02-09 DIAGNOSIS — D72829 Elevated white blood cell count, unspecified: Secondary | ICD-10-CM

## 2015-02-09 HISTORY — PX: LAPAROSCOPY: SHX197

## 2015-02-09 HISTORY — PX: DILATION AND EVACUATION: SHX1459

## 2015-02-09 LAB — TYPE AND SCREEN
ABO/RH(D): O POS
Antibody Screen: NEGATIVE

## 2015-02-09 LAB — CBC WITH DIFFERENTIAL/PLATELET
BASOS PCT: 0 %
Basophils Absolute: 0.1 10*3/uL (ref 0–0.1)
Basophils Absolute: 0 10*3/uL (ref 0–0.1)
Basophils Relative: 1 %
EOS ABS: 0.2 10*3/uL (ref 0–0.7)
EOS ABS: 0 10*3/uL (ref 0–0.7)
EOS PCT: 1 %
Eosinophils Relative: 0 %
HCT: 34.1 % — ABNORMAL LOW (ref 35.0–47.0)
HEMATOCRIT: 31 % — AB (ref 35.0–47.0)
HEMOGLOBIN: 10.1 g/dL — AB (ref 12.0–16.0)
Hemoglobin: 11.6 g/dL — ABNORMAL LOW (ref 12.0–16.0)
LYMPHS ABS: 2.1 10*3/uL (ref 1.0–3.6)
LYMPHS PCT: 12 %
Lymphocytes Relative: 11 %
Lymphs Abs: 1.3 10*3/uL (ref 1.0–3.6)
MCH: 30.8 pg (ref 26.0–34.0)
MCH: 29.1 pg (ref 26.0–34.0)
MCHC: 33.9 g/dL (ref 32.0–36.0)
MCHC: 32.4 g/dL (ref 32.0–36.0)
MCV: 90.9 fL (ref 80.0–100.0)
MCV: 89.8 fL (ref 80.0–100.0)
MONO ABS: 0.7 10*3/uL (ref 0.2–0.9)
MONOS PCT: 4 %
MONOS PCT: 2 %
Monocytes Absolute: 0.3 10*3/uL (ref 0.2–0.9)
NEUTROS ABS: 11 10*3/uL — AB (ref 1.4–6.5)
NEUTROS PCT: 87 %
Neutro Abs: 14 10*3/uL — ABNORMAL HIGH (ref 1.4–6.5)
Neutrophils Relative %: 82 %
PLATELETS: 227 10*3/uL (ref 150–440)
PLATELETS: 218 10*3/uL (ref 150–440)
RBC: 3.75 MIL/uL — ABNORMAL LOW (ref 3.80–5.20)
RBC: 3.46 MIL/uL — AB (ref 3.80–5.20)
RDW: 13.4 % (ref 11.5–14.5)
RDW: 13.2 % (ref 11.5–14.5)
WBC: 17.1 10*3/uL — ABNORMAL HIGH (ref 3.6–11.0)
WBC: 12.7 10*3/uL — AB (ref 3.6–11.0)

## 2015-02-09 LAB — BASIC METABOLIC PANEL
Anion gap: 8 (ref 5–15)
BUN: 9 mg/dL (ref 6–20)
CHLORIDE: 107 mmol/L (ref 101–111)
CO2: 21 mmol/L — ABNORMAL LOW (ref 22–32)
CREATININE: 0.5 mg/dL (ref 0.44–1.00)
Calcium: 8.5 mg/dL — ABNORMAL LOW (ref 8.9–10.3)
GFR calc Af Amer: >60 mL/min (ref >60)
GLUCOSE: 110 mg/dL — AB (ref 65–99)
Potassium: 3.2 mmol/L — ABNORMAL LOW (ref 3.5–5.1)
SODIUM: 136 mmol/L (ref 135–145)

## 2015-02-09 LAB — HCG, QUANTITATIVE, PREGNANCY: HCG, BETA CHAIN, QUANT, S: 1494 m[IU]/mL — AB (ref <5)

## 2015-02-09 LAB — ABO/RH: ABO/RH(D): O POS

## 2015-02-09 LAB — POCT PREGNANCY, URINE: PREG TEST UR: POSITIVE — AB

## 2015-02-09 IMAGING — US US OB TRANSVAGINAL
1 series · 13 of 28 positions shown · non-contrast
Comparison: Pelvic ultrasound December 13, 2014.

CLINICAL DATA: First trimester of pregnancy, left lower quadrant
abdominal pain for 2 weeks.

EXAM:
OBSTETRIC <14 WK US AND TRANSVAGINAL OB US
TECHNIQUE: Both transabdominal and transvaginal ultrasound examinations were
performed for complete evaluation of the gestation as well as the
maternal uterus, adnexal regions, and pelvic cul-de-sac.
Transvaginal technique was performed to assess early pregnancy.

[Series 1: us ob transvaginal · 0.23mm/px · 13 of 80 slices shown]
[im 3/80]
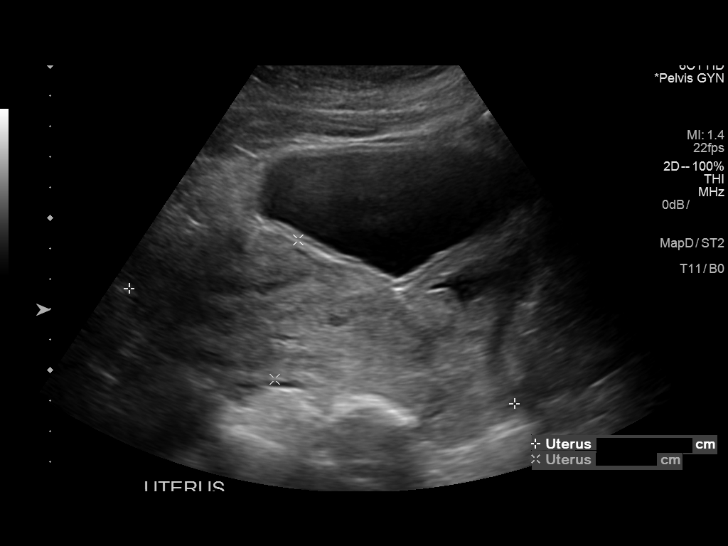
[im 9/80]
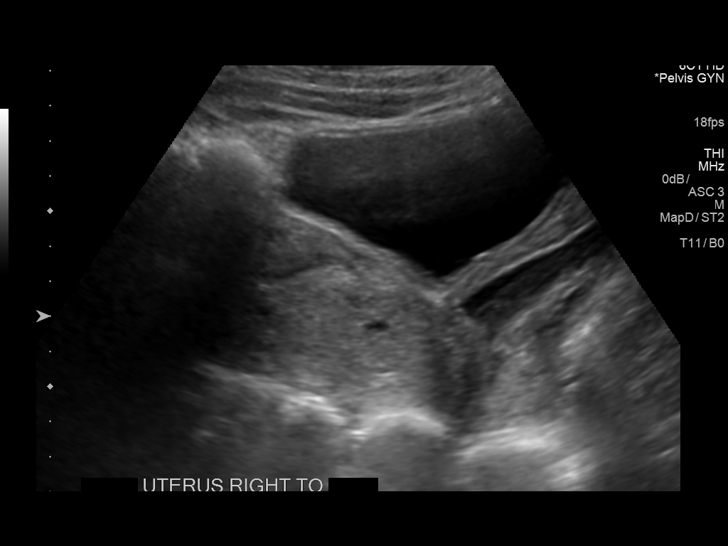
[im 15/80]
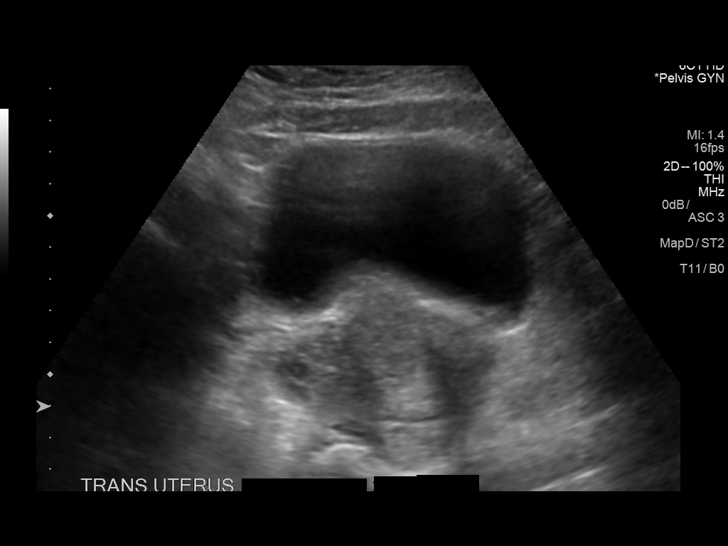
[im 21/80]
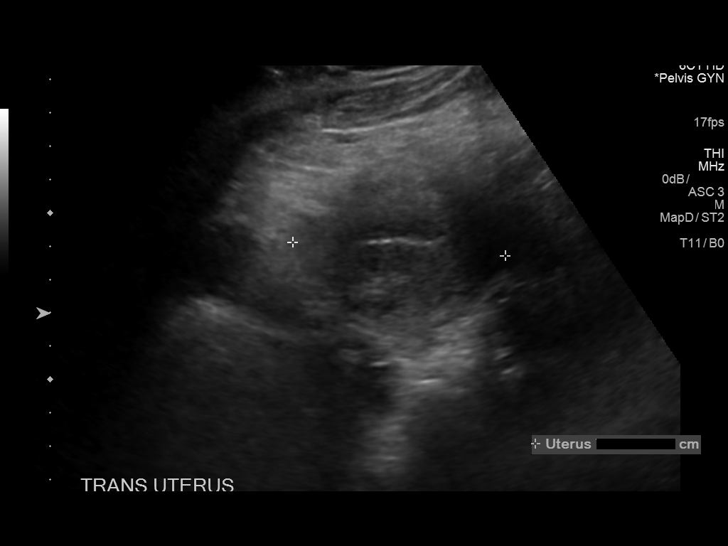
[im 27/80]
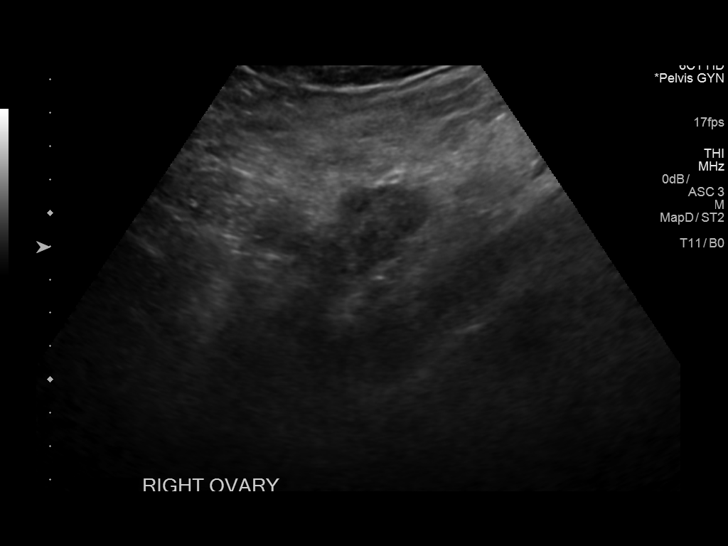
[im 33/80]
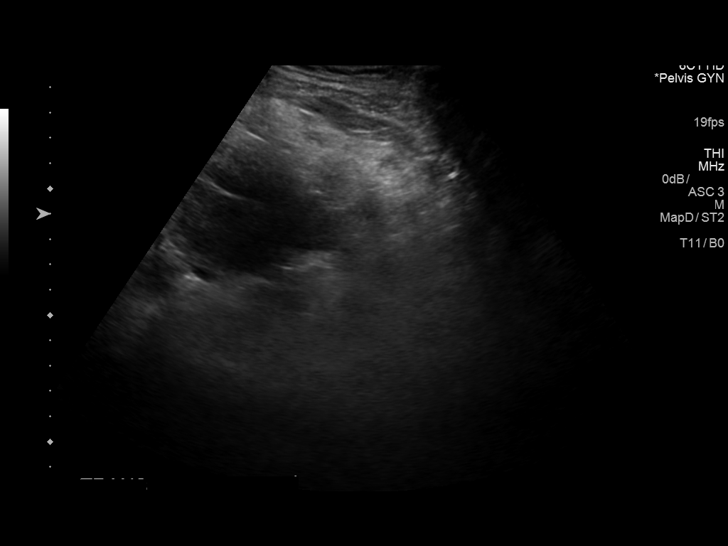
[im 41/80]
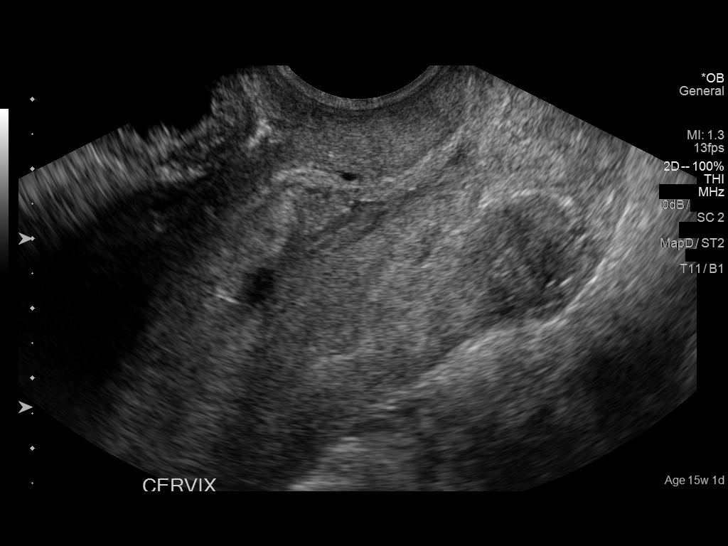
[im 47/80]
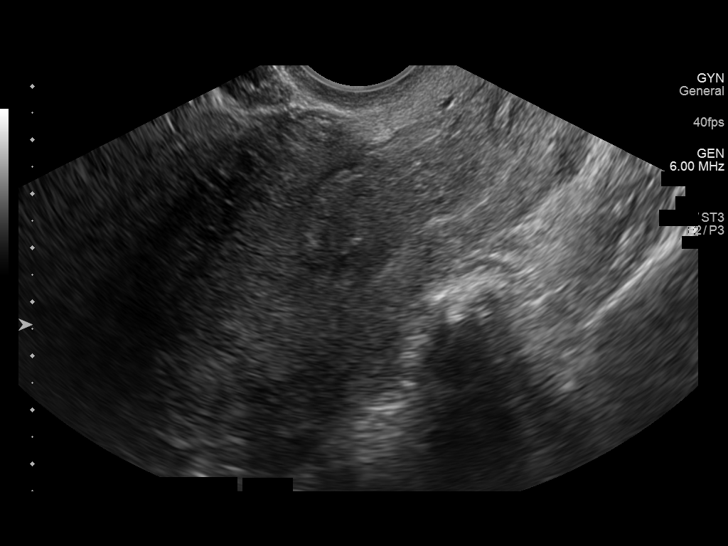
[im 53/80]
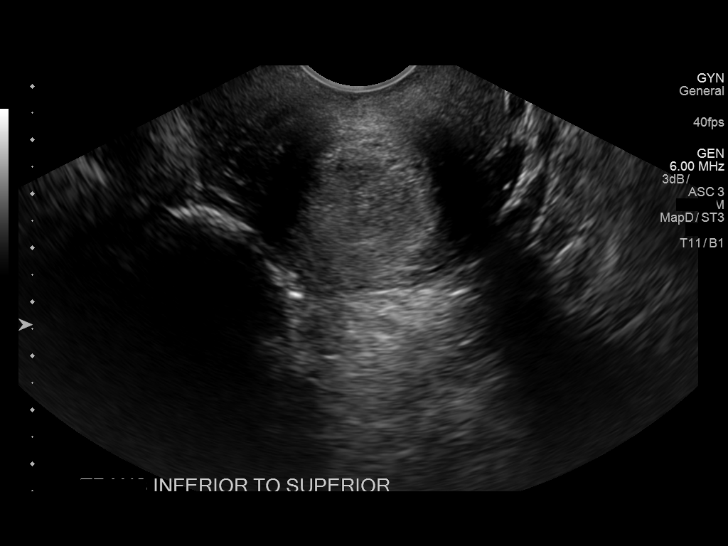
[im 59/80]
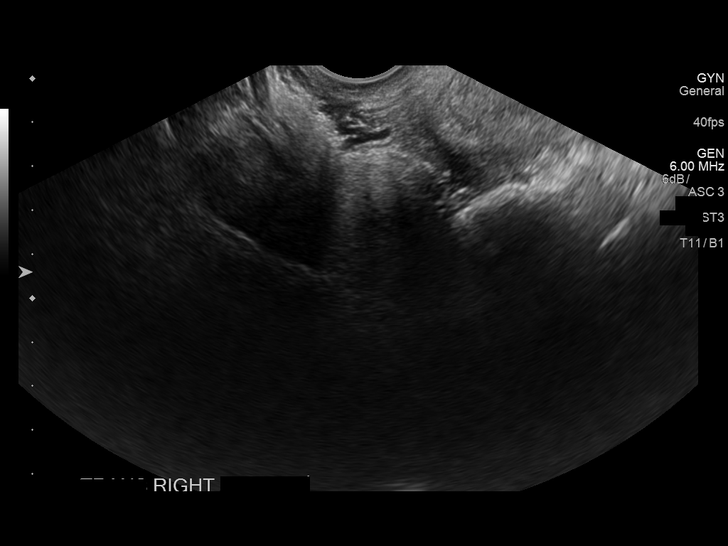
[im 65/80]
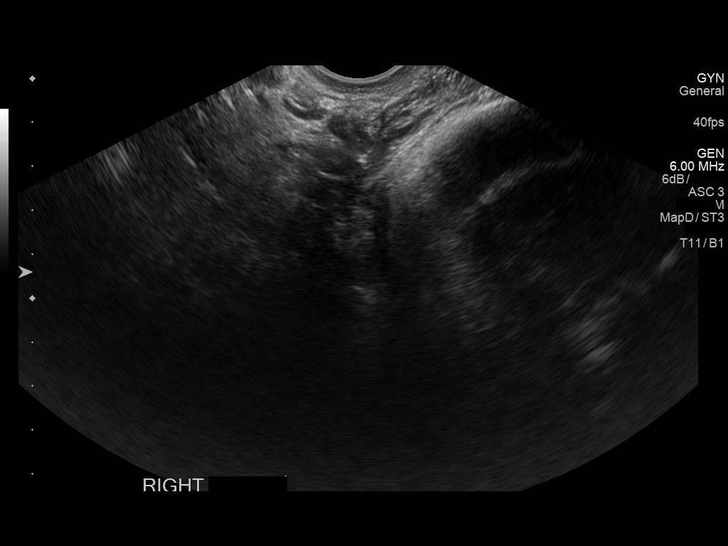
[im 71/80]
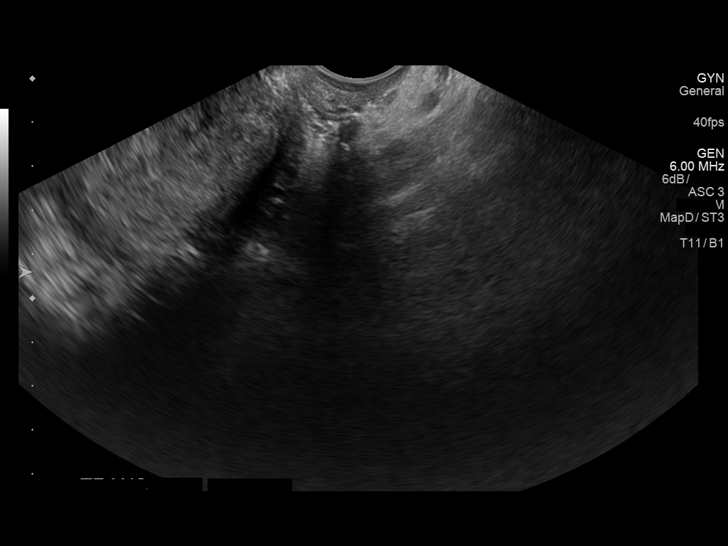
[im 77/80]
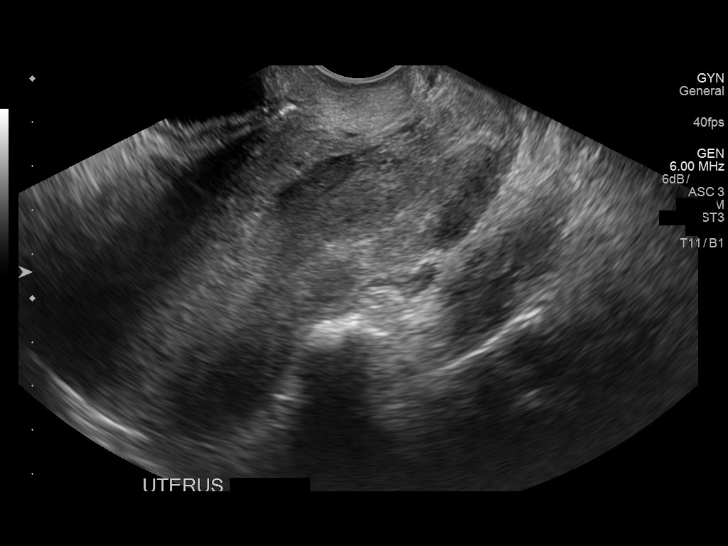

[13 of 28 positions shown; findings below may reference images not displayed]

FINDINGS: Intrauterine gestational sac: Possible gestational sac is noted.

Yolk sac:  Not visualized.

Embryo:  Not visualized.

Cardiac Activity: Not visualized.

Maternal uterus/adnexae: Trace free fluid is noted which most likely
is physiologic. Left ovary not visualized due to overlying bowel
gas. Probable corpus luteum cyst seen in right ovary.
IMPRESSION: Possible early intrauterine gestational sac, but no definite yolk
sac, fetal pole, or cardiac activity yet visualized. Recommend
follow-up quantitative B-HCG levels and follow-up US in 14 days to
confirm and assess viability. This recommendation follows SRU
consensus guidelines: Diagnostic Criteria for Nonviable Pregnancy
Early in the First Trimester. N Engl J Med 3358; [DATE].

## 2015-02-09 SURGERY — DILATION AND EVACUATION, UTERUS
Anesthesia: General

## 2015-02-09 MED ORDER — METHYLERGONOVINE MALEATE 0.2 MG/ML IJ SOLN
INTRAMUSCULAR | Status: AC
  Filled 2015-02-09: qty 1

## 2015-02-09 MED ORDER — FENTANYL CITRATE (PF) 100 MCG/2ML IJ SOLN
INTRAMUSCULAR | Status: DC | PRN
  Administered 2015-02-09 (×2): 50 ug via INTRAVENOUS

## 2015-02-09 MED ORDER — MORPHINE SULFATE (PF) 4 MG/ML IV SOLN
INTRAVENOUS | Status: AC
  Administered 2015-02-09: 4 mg via INTRAVENOUS
  Filled 2015-02-09: qty 1

## 2015-02-09 MED ORDER — SODIUM CHLORIDE 0.9 % IV BOLUS (SEPSIS)
1000.0000 mL | Freq: Once | INTRAVENOUS | Status: AC
Start: 2015-02-09 — End: 2015-02-09

## 2015-02-09 MED ORDER — LACTATED RINGERS IV SOLN
INTRAVENOUS | Status: DC | PRN
  Administered 2015-02-09: 14:00:00 via INTRAVENOUS

## 2015-02-09 MED ORDER — OXYCODONE-ACETAMINOPHEN 5-325 MG PO TABS: 1.0000 | ORAL_TABLET | Freq: Four times a day (QID) | ORAL | Status: DC | PRN

## 2015-02-09 MED ORDER — ONDANSETRON 4 MG PO TBDP: 4.0000 mg | ORAL_TABLET | Freq: Four times a day (QID) | ORAL | Status: DC | PRN

## 2015-02-09 MED ORDER — METHYLERGONOVINE MALEATE 0.2 MG/ML IJ SOLN
INTRAMUSCULAR | Status: DC | PRN
  Administered 2015-02-09: 0.2 mg via INTRAMUSCULAR

## 2015-02-09 MED ORDER — MIDAZOLAM HCL 2 MG/2ML IJ SOLN
INTRAMUSCULAR | Status: DC | PRN
  Administered 2015-02-09: 2 mg via INTRAVENOUS

## 2015-02-09 MED ORDER — DEXAMETHASONE SODIUM PHOSPHATE 10 MG/ML IJ SOLN
INTRAMUSCULAR | Status: DC | PRN
  Administered 2015-02-09: 5 mg via INTRAVENOUS

## 2015-02-09 MED ORDER — SODIUM CHLORIDE 0.9 % IV BOLUS (SEPSIS)
1000.0000 mL | Freq: Once | INTRAVENOUS | Status: AC
  Administered 2015-02-09: 1000 mL via INTRAVENOUS

## 2015-02-09 MED ORDER — DOXYCYCLINE HYCLATE 100 MG PO TABS
200.0000 mg | ORAL_TABLET | Freq: Once | ORAL | Status: AC
  Administered 2015-02-09: 200 mg via ORAL
  Filled 2015-02-09: qty 2

## 2015-02-09 MED ORDER — KETOROLAC TROMETHAMINE 30 MG/ML IJ SOLN
INTRAMUSCULAR | Status: DC | PRN
  Administered 2015-02-09: 30 mg via INTRAVENOUS

## 2015-02-09 MED ORDER — ROCURONIUM BROMIDE 100 MG/10ML IV SOLN
INTRAVENOUS | Status: DC | PRN
  Administered 2015-02-09: 5 mg via INTRAVENOUS
  Administered 2015-02-09: 15 mg via INTRAVENOUS

## 2015-02-09 MED ORDER — MORPHINE SULFATE (PF) 4 MG/ML IV SOLN
4.0000 mg | Freq: Once | INTRAVENOUS | Status: AC
  Administered 2015-02-09: 4 mg via INTRAVENOUS

## 2015-02-09 MED ORDER — LIDOCAINE HCL (CARDIAC) 20 MG/ML IV SOLN
INTRAVENOUS | Status: DC | PRN
Start: 2015-02-09 — End: 2015-02-09
  Administered 2015-02-09: 100 mg via INTRAVENOUS

## 2015-02-09 MED ORDER — EPHEDRINE SULFATE 50 MG/ML IJ SOLN
INTRAMUSCULAR | Status: DC | PRN
  Administered 2015-02-09: 5 mg via INTRAVENOUS

## 2015-02-09 MED ORDER — IBUPROFEN 800 MG PO TABS: 800.0000 mg | ORAL_TABLET | Freq: Three times a day (TID) | ORAL | Status: DC | PRN

## 2015-02-09 MED ORDER — NEOSTIGMINE METHYLSULFATE 10 MG/10ML IV SOLN
INTRAVENOUS | Status: DC | PRN
  Administered 2015-02-09: 3 mg via INTRAVENOUS

## 2015-02-09 MED ORDER — BUPIVACAINE HCL (PF) 0.5 % IJ SOLN
INTRAMUSCULAR | Status: DC | PRN
  Administered 2015-02-09: 16 mL

## 2015-02-09 MED ORDER — GLYCOPYRROLATE 0.2 MG/ML IJ SOLN
INTRAMUSCULAR | Status: DC | PRN
  Administered 2015-02-09: 0.4 mg via INTRAVENOUS
  Administered 2015-02-09: 0.2 mg via INTRAVENOUS

## 2015-02-09 MED ORDER — SUCCINYLCHOLINE CHLORIDE 20 MG/ML IJ SOLN
INTRAMUSCULAR | Status: DC | PRN
  Administered 2015-02-09: 120 mg via INTRAVENOUS

## 2015-02-09 MED ORDER — HYDROMORPHONE HCL 1 MG/ML IJ SOLN: 0.2500 mg | INTRAMUSCULAR | Status: DC | PRN

## 2015-02-09 MED ORDER — PROPOFOL 10 MG/ML IV BOLUS
INTRAVENOUS | Status: DC | PRN
  Administered 2015-02-09: 150 mg via INTRAVENOUS

## 2015-02-09 MED ORDER — PHENYLEPHRINE HCL 10 MG/ML IJ SOLN
INTRAMUSCULAR | Status: DC | PRN
  Administered 2015-02-09: 100 ug via INTRAVENOUS

## 2015-02-09 MED ORDER — KETOROLAC TROMETHAMINE 30 MG/ML IJ SOLN
30.0000 mg | Freq: Once | INTRAMUSCULAR | Status: AC
  Administered 2015-02-09: 30 mg via INTRAVENOUS

## 2015-02-09 MED ORDER — ACETAMINOPHEN 10 MG/ML IV SOLN
INTRAVENOUS | Status: DC | PRN
  Administered 2015-02-09: 1000 mg via INTRAVENOUS

## 2015-02-09 MED ORDER — ONDANSETRON HCL 4 MG/2ML IJ SOLN: 4.0000 mg | Freq: Once | INTRAMUSCULAR | Status: DC | PRN

## 2015-02-09 MED ORDER — KETOROLAC TROMETHAMINE 30 MG/ML IJ SOLN
INTRAMUSCULAR | Status: AC
  Administered 2015-02-09: 30 mg via INTRAVENOUS
  Filled 2015-02-09: qty 1

## 2015-02-09 SURGICAL SUPPLY — 28 items
CATH FOLEY 2WAY  5CC 16FR (CATHETERS) ×2
CATH ROBINSON RED A/P 16FR (CATHETERS) ×4 IMPLANT
CATH URTH 16FR FL 2W BLN LF (CATHETERS) ×2 IMPLANT
FILTER UTR ASPR SPEC (MISCELLANEOUS) ×2 IMPLANT
FLTR UTR ASPR SPEC (MISCELLANEOUS) ×4
GLOVE BIO SURGEON STRL SZ 6.5 (GLOVE) ×3 IMPLANT
GLOVE BIO SURGEONS STRL SZ 6.5 (GLOVE) ×1
GLOVE INDICATOR 7.0 STRL GRN (GLOVE) ×4 IMPLANT
GOWN STRL REUS W/ TWL LRG LVL3 (GOWN DISPOSABLE) ×4 IMPLANT
GOWN STRL REUS W/TWL LRG LVL3 (GOWN DISPOSABLE) ×4
KIT BERKELEY 1ST TRIMESTER 3/8 (MISCELLANEOUS) ×4 IMPLANT
KIT RM TURNOVER CYSTO AR (KITS) ×4 IMPLANT
PACK DNC HYST (MISCELLANEOUS) ×4 IMPLANT
PACK GYN LAPAROSCOPIC (MISCELLANEOUS) ×4 IMPLANT
PAD OB MATERNITY 4.3X12.25 (PERSONAL CARE ITEMS) ×4 IMPLANT
PAD PREP 24X41 OB/GYN DISP (PERSONAL CARE ITEMS) ×8 IMPLANT
SET BERKELEY SUCTION TUBING (SUCTIONS) ×4 IMPLANT
SLEEVE ENDOPATH XCEL 5M (ENDOMECHANICALS) ×4 IMPLANT
TOWEL OR 17X26 4PK STRL BLUE (TOWEL DISPOSABLE) ×4 IMPLANT
TROCAR ENDO BLADELESS 11MM (ENDOMECHANICALS) ×4 IMPLANT
TROCAR XCEL NON-BLD 5MMX100MML (ENDOMECHANICALS) ×4 IMPLANT
TUBING INSUFFLATOR HI FLOW (MISCELLANEOUS) ×4 IMPLANT
VACURETTE 10 RIGID CVD (CANNULA) IMPLANT
VACURETTE 6 ASPIR F TIP BERK (CANNULA) ×4 IMPLANT
VACURETTE 7MM F TIP (CANNULA)
VACURETTE 7MM F TIP STRL (CANNULA) IMPLANT
VACURETTE 8 RIGID CVD (CANNULA) ×4 IMPLANT
VACURETTE 8MM F TIP (MISCELLANEOUS) ×4 IMPLANT

## 2015-02-09 NOTE — Consult Note (Signed)
Consult History and Physical   SERVICE: Gynecology  Patient Name: Jeanne [name] Patient MRN:   [id number]  CC: Vaginal bleeding  HPI: Jeanne [name] is a 24 y.o. G3P1011 with an unknown LMP who presents with vaginal bleeding, some mild abdominal pain on right earlier and now on left. Started 5 days ago, progressed to 1 pad per hour in the ER. Urine pregnancy test positive. LMP in September, and she had a Incomplete AB in October that was treated by Dr. Chalmers Guest with home cytotec. She did not followup for quants to zero and her last beta quant was 28,719 on 12/22/14 in the clinic prior to treatment with misoprostol. Today, it is 1494.  Overall, she appears mildly fatigued. No acute distress. No dysuria, bowel sx, SOB, CP or palpitations. No nausea/vomiting. No fever. She does endorse lightheadedness when she stands that has occurred over today.  Review of Systems: positives in bold GEN:   fevers, chills, weight changes, appetite changes, fatigue, night sweats. Dizziness with ambulation HEENT:  HA, vision changes, hearing loss, congestion, rhinorrhea, sinus pressure, dysphagia CV:   CP, palpitations PULM:  SOB, cough GI:  abd pain, N/V/D/C GU:  dysuria, urgency, frequency - see HPI MSK:  arthralgias, myalgias, back pain, swelling SKIN:  rashes, color changes, pallor NEURO:  numbness, weakness, tingling, seizures, dizziness, tremors PSYCH:  depression, anxiety, behavioral problems, confusion  HEME/LYMPH:  easy bruising or bleeding ENDO:  heat/cold intolerance  Past Obstetrical History: OB History    G3P1011, prior NSVD in 92/9574, uncomplicated. Prior MAB treated with cytotec in 11/2014.      Past Gynecologic History: Patient's last menstrual period was 02/04/2015. Menstrual frequency Q 4 wks lasting 5 days, no dysmenorrhea or menorrhagia.  Past Medical History: No past medical history on file.  Past Surgical History:  No past surgical history on file.  Family  History:  family history is not on file.  Social History:  Social History   Social History  . Marital Status: Single    Spouse Name: N/A  . Number of Children: N/A  . Years of Education: N/A   Occupational History  . Not on file.   Social History Main Topics  . Smoking status: Never Smoker   . Smokeless tobacco: Never Used  . Alcohol Use: No  . Drug Use: No  . Sexual Activity: Not on file   Other Topics Concern  . Not on file   Social History Narrative  . No narrative on file    Home Medications:  Medications reconciled in EPIC  No current facility-administered medications on file prior to encounter.   No current outpatient prescriptions on file prior to encounter.    Allergies:  No Known Allergies  Physical Exam:  Temp:  [98.4 F (36.9 C)-98.8 F (37.1 C)] 98.8 F (37.1 C) (12/14 0951) Pulse Rate:  [57-89] 70 (12/14 1124) Resp:  [16-18] 16 (12/14 0642) BP: (69-112)/(30-61) 112/61 mmHg (12/14 1124) SpO2:  [97 %-100 %] 99 % (12/14 1124) Weight:  [52.617 kg (116 lb)] 52.617 kg (116 lb) (12/14 0615)   General Appearance:  Well developed, well nourished, no acute distress, alert and oriented x3 HEENT:  Normocephalic atraumatic, extraocular movements intact, moist mucous membranes Cardiovascular:  Normal S1/S2, regular rate and rhythm, no murmurs Pulmonary:  clear to auscultation, no wheezes, rales or rhonchi, symmetric air entry, good air exchange Abdomen:  Bowel sounds present, soft, nontender, nondistended, no abnormal masses, no epigastric pain Extremities:  Full range of motion, no  pedal edema, 2+ distal pulses, no tenderness Skin:  normal coloration and turgor, no rashes, no suspicious skin lesions noted  Neurologic:  Cranial nerves 2-12 grossly intact, normal muscle tone, strength 5/5 all four extremities Psychiatric:  Normal mood and affect, appropriate, no AH/VH  Pelvic:  NEFG, no vulvar masses or lesions, normal vaginal mucosa. +Heavy vaginal  bleeding, no malodarous discharge, external os open to 2 cm with large blood clot in os and bright red blood filling the vaginal canal, no cervical motion tenderness, cervix without lesions or erythema, non tender uterus, no adnexal masses appreciated.  Labs/Studies:   CBC and Coags:  Lab Results  Component Value Date   WBC 12.7* 02/09/2015   NEUTOPHILPCT 87 02/09/2015   EOSPCT 0 02/09/2015   BASOPCT 0 02/09/2015   LYMPHOPCT 11 02/09/2015   HGB 10.1* 02/09/2015   HCT 31.0* 02/09/2015   MCV 89.8 02/09/2015   PLT 218 02/09/2015   CMP:  Lab Results  Component Value Date   NA 136 02/09/2015   K 3.2* 02/09/2015   CL 107 02/09/2015   CO2 21* 02/09/2015   BUN 9 02/09/2015   CREATININE 0.50 02/09/2015   CREATININE 0.51 12/13/2014   PROT 7.2 12/13/2014   BILITOT 0.5 12/13/2014   ALT 51 12/13/2014   AST 56* 12/13/2014   ALKPHOS 77 12/13/2014   Other Labs: Repeat CBC:  Recent Results (from the past 2160 hour(s))  Comprehensive metabolic panel     Status: Abnormal   Collection Time: 12/13/14  1:46 PM  Result Value Ref Range   Sodium 135 135 - 145 mmol/L   Potassium 3.2 (L) 3.5 - 5.1 mmol/L   Chloride 107 101 - 111 mmol/L   CO2 24 22 - 32 mmol/L   Glucose, Bld 87 65 - 99 mg/dL   BUN 7 6 - 20 mg/dL   Creatinine, Ser 0.51 0.44 - 1.00 mg/dL   Calcium 8.7 (L) 8.9 - 10.3 mg/dL   Total Protein 7.2 6.5 - 8.1 g/dL   Albumin 4.2 3.5 - 5.0 g/dL   AST 56 (H) 15 - 41 U/L   ALT 51 14 - 54 U/L   Alkaline Phosphatase 77 38 - 126 U/L   Total Bilirubin 0.5 0.3 - 1.2 mg/dL   GFR calc non Af Amer >60 >60 mL/min   GFR calc Af Amer >60 >60 mL/min    Comment: (NOTE) The eGFR has been calculated using the CKD EPI equation. This calculation has not been validated in all clinical situations. eGFR's persistently <60 mL/min signify possible Chronic Kidney Disease.    Anion gap 4 (L) 5 - 15  CBC     Status: None   Collection Time: 12/13/14  1:46 PM  Result Value Ref Range   WBC 5.5 3.6 - 11.0  K/uL   RBC 4.11 3.80 - 5.20 MIL/uL   Hemoglobin 12.4 12.0 - 16.0 g/dL   HCT 36.9 35.0 - 47.0 %   MCV 89.7 80.0 - 100.0 fL   MCH 30.2 26.0 - 34.0 pg   MCHC 33.6 32.0 - 36.0 g/dL   RDW 13.0 11.5 - 14.5 %   Platelets 236 150 - 440 K/uL  hCG, quantitative, pregnancy     Status: Abnormal   Collection Time: 12/13/14  1:46 PM  Result Value Ref Range   hCG, Beta Chain, Quant, S 1218 (H) <5 mIU/mL    Comment:          GEST. AGE      CONC.  (mIU/mL)   <=  1 WEEK        5 - 50     2 WEEKS       50 - 500     3 WEEKS       100 - 10,000     4 WEEKS     1,000 - 30,000     5 WEEKS     3,500 - 115,000   6-8 WEEKS     12,000 - 270,000    12 WEEKS     15,000 - 220,000        FEMALE AND NON-PREGNANT FEMALE:     LESS THAN 5 mIU/mL   Urinalysis complete, with microscopic ([facility] only)     Status: Abnormal   Collection Time: 12/13/14  1:46 PM  Result Value Ref Range   Color, Urine YELLOW (A) YELLOW   APPearance CLEAR (A) CLEAR   Glucose, UA NEGATIVE NEGATIVE mg/dL   Bilirubin Urine NEGATIVE NEGATIVE   Ketones, ur NEGATIVE NEGATIVE mg/dL   Specific Gravity, Urine 1.026 1.005 - 1.030   Hgb urine dipstick NEGATIVE NEGATIVE   pH 6.0 5.0 - 8.0   Protein, ur NEGATIVE NEGATIVE mg/dL   Nitrite NEGATIVE NEGATIVE   Leukocytes, UA NEGATIVE NEGATIVE   RBC / HPF 0-5 0 - 5 RBC/hpf   WBC, UA 0-5 0 - 5 WBC/hpf   Bacteria, UA NONE SEEN NONE SEEN   Squamous Epithelial / LPF 0-5 (A) NONE SEEN   Mucous PRESENT   Wet prep, genital     Status: Abnormal   Collection Time: 12/13/14  3:20 PM  Result Value Ref Range   Yeast Wet Prep HPF POC NONE SEEN NONE SEEN   Trich, Wet Prep NONE SEEN NONE SEEN   Clue Cells Wet Prep HPF POC NONE SEEN NONE SEEN   WBC, Wet Prep HPF POC FEW (A) NONE SEEN  Chlamydia/NGC rt PCR     Status: None   Collection Time: 12/13/14  3:20 PM  Result Value Ref Range   Specimen source GC/Chlam ENDOCERVICAL    Chlamydia Tr NOT DETECTED NOT DETECTED   N gonorrhoeae NOT DETECTED NOT DETECTED     Comment: (NOTE) 100  This methodology has not been evaluated in pregnant women or in 200  patients with a history of hysterectomy. 300 400  This methodology will not be performed on patients less than 30  years of age.   CBC with Differential     Status: Abnormal   Collection Time: 02/09/15  6:24 AM  Result Value Ref Range   WBC 17.1 (H) 3.6 - 11.0 K/uL   RBC 3.75 (L) 3.80 - 5.20 MIL/uL   Hemoglobin 11.6 (L) 12.0 - 16.0 g/dL   HCT 34.1 (L) 35.0 - 47.0 %   MCV 90.9 80.0 - 100.0 fL   MCH 30.8 26.0 - 34.0 pg   MCHC 33.9 32.0 - 36.0 g/dL   RDW 13.4 11.5 - 14.5 %   Platelets 227 150 - 440 K/uL   Neutrophils Relative % 82 %   Neutro Abs 14.0 (H) 1.4 - 6.5 K/uL   Lymphocytes Relative 12 %   Lymphs Abs 2.1 1.0 - 3.6 K/uL   Monocytes Relative 4 %   Monocytes Absolute 0.7 0.2 - 0.9 K/uL   Eosinophils Relative 1 %   Eosinophils Absolute 0.2 0 - 0.7 K/uL   Basophils Relative 1 %   Basophils Absolute 0.1 0 - 0.1 K/uL  Basic metabolic panel     Status: Abnormal   Collection  Time: 02/09/15  6:24 AM  Result Value Ref Range   Sodium 136 135 - 145 mmol/L   Potassium 3.2 (L) 3.5 - 5.1 mmol/L   Chloride 107 101 - 111 mmol/L   CO2 21 (L) 22 - 32 mmol/L   Glucose, Bld 110 (H) 65 - 99 mg/dL   BUN 9 6 - 20 mg/dL   Creatinine, Ser 0.50 0.44 - 1.00 mg/dL   Calcium 8.5 (L) 8.9 - 10.3 mg/dL   GFR calc non Af Amer >60 >60 mL/min   GFR calc Af Amer >60 >60 mL/min    Comment: (NOTE) The eGFR has been calculated using the CKD EPI equation. This calculation has not been validated in all clinical situations. eGFR's persistently <60 mL/min signify possible Chronic Kidney Disease.    Anion gap 8 5 - 15  hCG, quantitative, pregnancy     Status: Abnormal   Collection Time: 02/09/15  6:24 AM  Result Value Ref Range   hCG, Beta Chain, Quant, S 1494 (H) <5 mIU/mL    Comment:          GEST. AGE      CONC.  (mIU/mL)   <=1 WEEK        5 - 50     2 WEEKS       50 - 500     3 WEEKS       100 - 10,000      4 WEEKS     1,000 - 30,000     5 WEEKS     3,500 - 115,000   6-8 WEEKS     12,000 - 270,000    12 WEEKS     15,000 - 220,000        FEMALE AND NON-PREGNANT FEMALE:     LESS THAN 5 mIU/mL   Type and screen [place]     Status: None   Collection Time: 02/09/15  6:24 AM  Result Value Ref Range   ABO/RH(D) O POS    Antibody Screen NEG    Sample Expiration 02/12/2015   Pregnancy, urine POC     Status: Abnormal   Collection Time: 02/09/15  7:13 AM  Result Value Ref Range   Preg Test, Ur POSITIVE (A) NEGATIVE    Comment:        THE SENSITIVITY OF THIS METHODOLOGY IS >24 mIU/mL   CBC with Differential/Platelet     Status: Abnormal   Collection Time: 02/09/15 12:15 PM  Result Value Ref Range   WBC 12.7 (H) 3.6 - 11.0 K/uL   RBC 3.46 (L) 3.80 - 5.20 MIL/uL   Hemoglobin 10.1 (L) 12.0 - 16.0 g/dL   HCT 31.0 (L) 35.0 - 47.0 %   MCV 89.8 80.0 - 100.0 fL   MCH 29.1 26.0 - 34.0 pg   MCHC 32.4 32.0 - 36.0 g/dL   RDW 13.2 11.5 - 14.5 %   Platelets 218 150 - 440 K/uL   Neutrophils Relative % 87 %   Neutro Abs 11.0 (H) 1.4 - 6.5 K/uL   Lymphocytes Relative 11 %   Lymphs Abs 1.3 1.0 - 3.6 K/uL   Monocytes Relative 2 %   Monocytes Absolute 0.3 0.2 - 0.9 K/uL   Eosinophils Relative 0 %   Eosinophils Absolute 0.0 0 - 0.7 K/uL   Basophils Relative 0 %   Basophils Absolute 0.0 0 - 0.1 K/uL  ]  TVUS:   Other Imaging: US Ob Comp Less 14 Wks  02/09/2015  CLINICAL DATA:  First trimester of pregnancy, left lower quadrant abdominal pain for 2 weeks. EXAM: OBSTETRIC <14 WK [place] AND TRANSVAGINAL OB US TECHNIQUE: Both transabdominal and transvaginal ultrasound examinations were performed for complete evaluation of the gestation as well as the maternal uterus, adnexal regions, and pelvic cul-de-sac. Transvaginal technique was performed to assess early pregnancy. COMPARISON:  Pelvic ultrasound of December 13, 2014. FINDINGS: Intrauterine gestational sac: Possible gestational sac is  noted. Yolk sac:  Not visualized. Embryo:  Not visualized. Cardiac Activity: Not visualized. Maternal uterus/adnexae: Trace free fluid is noted which most likely is physiologic. Left ovary not visualized due to overlying bowel gas. Probable corpus luteum cyst seen in right ovary. IMPRESSION: Possible early intrauterine gestational sac, but no definite yolk sac, fetal pole, or cardiac activity yet visualized. Recommend follow-up quantitative B-HCG levels and follow-up US in 14 days to confirm and assess viability. This recommendation follows SRU consensus guidelines: Diagnostic Criteria for Nonviable Pregnancy Early in the First Trimester. [place] Med 2013; 086:5784-69. Electronically Signed   By: Marijo Conception, M.D.   On: 02/09/2015 08:48   US Ob Transvaginal  02/09/2015  CLINICAL DATA:  First trimester of pregnancy, left lower quadrant abdominal pain for 2 weeks. EXAM: OBSTETRIC <14 WK [place] AND TRANSVAGINAL OB US TECHNIQUE: Both transabdominal and transvaginal ultrasound examinations were performed for complete evaluation of the gestation as well as the maternal uterus, adnexal regions, and pelvic cul-de-sac. Transvaginal technique was performed to assess early pregnancy. COMPARISON:  Pelvic ultrasound of December 13, 2014. FINDINGS: Intrauterine gestational sac: Possible gestational sac is noted. Yolk sac:  Not visualized. Embryo:  Not visualized. Cardiac Activity: Not visualized. Maternal uterus/adnexae: Trace free fluid is noted which most likely is physiologic. Left ovary not visualized due to overlying bowel gas. Probable corpus luteum cyst seen in right ovary. IMPRESSION: Possible early intrauterine gestational sac, but no definite yolk sac, fetal pole, or cardiac activity yet visualized. Recommend follow-up quantitative B-HCG levels and follow-up US in 14 days to confirm and assess viability. This recommendation follows SRU consensus guidelines: Diagnostic Criteria for Nonviable Pregnancy Early in the  First Trimester. [employer] Med 2013; 629:5284-13. Electronically Signed   By: Marijo Conception, M.D.   On: 02/09/2015 08:48    Possible gestational sac measures 6.47m, has poorly defined and irregular borders, with no yolk sac or CRL. Images reviewed by me.    Assessment / Plan:   Jeanne M LGIDGET QUIZHPIis a 24y.o. G3P1011 at unknown gestational age who presents with incomplete abortion and heavy bleeding.  1. Incomplete abortion: Discussed management of missed abortion: expectant management vs misoprostol vs D&E.  Repeat CBC with decreased WBC from 17.1 to 12.7 over 6 hours but with hgb dropping from 11.6 to 10.1 without iv hydration and platelets remaining stable at 218. Because of her heavy bleeding with an acute drop in hgb and her sx, and her open cervical os, we have decided to proceed with surgical evacuation. I have discussed the risks/benefits and alternatives with the patient and her family, including bleeding, infection, injury to surrounding organs, need for additional procedures, possibility of intrauterine scarring which may impair future fertility, risk of retained products which may require further management and other postoperative/anesthesia complications will be explained to patient if they occur.  This will be an add-on case for today.  She is Rh positive.

## 2015-02-09 NOTE — ED Provider Notes (Signed)
Labs Reviewed  CBC WITH DIFFERENTIAL/PLATELET - Abnormal; Notable for the following:    WBC 17.1 (*)    RBC 3.75 (*)    Hemoglobin 11.6 (*)    HCT 34.1 (*)    Neutro Abs 14.0 (*)    All other components within normal limits  BASIC METABOLIC PANEL - Abnormal; Notable for the following:    Potassium 3.2 (*)    CO2 21 (*)    Glucose, Bld 110 (*)    Calcium 8.5 (*)    All other components within normal limits  HCG, QUANTITATIVE, PREGNANCY - Abnormal; Notable for the following:    hCG, Beta Chain, Quant, S 1494 (*)    All other components within normal limits  POCT PREGNANCY, URINE - Abnormal; Notable for the following:    Preg Test, Ur POSITIVE (*)    All other components within normal limits  POC URINE PREG, ED  TYPE AND SCREEN  ABO/RH    Patient currently to ultrasound for further evaluation. US results  IMPRESSION: Possible early intrauterine gestational sac, but no definite yolk sac, fetal pole, or cardiac activity yet visualized. Recommend follow-up quantitative B-HCG levels and follow-up US in 14 days to confirm and assess viability. This recommendation follows SRU consensus guidelines: Diagnostic Criteria for Nonviable Pregnancy Early in the First Trimester. Malva Limes Engl J Med 2013; 409:8119-14; 369:1443-51.  I have discussed the case with Dr. Dalbert GarnetBeasley who has come to the ER to evaluate the patient and will likely take her to the OR for a D&C due to her heavy vaginal bleeding.  Emily FilbertJonathan E Jasyn Mey, MD 02/11/15 [phone]

## 2015-02-09 NOTE — Anesthesia Postprocedure Evaluation (Signed)
Anesthesia Post Note  Patient: Jeanne [name]  Procedure(s) Performed: Procedure(s) (LRB): DILATATION AND EVACUATION (N/A) LAPAROSCOPY DIAGNOSTIC  Patient location during evaluation: PACU Anesthesia Type: General Level of consciousness: awake Pain management: pain level controlled Vital Signs Assessment: post-procedure vital signs reviewed and stable Respiratory status: spontaneous breathing Cardiovascular status: blood pressure returned to baseline Postop Assessment: no headache Anesthetic complications: no    Last Vitals:  Filed Vitals:   02/09/15 1342 02/09/15 1507  BP: 100/68 111/60  Pulse: 70 83  Temp: 37.6 C 36.6 C  Resp: 16 18    Last Pain:  Filed Vitals:   02/09/15 1511  PainSc: Asleep                 Dontavious Emily M

## 2015-02-09 NOTE — Discharge Instructions (Signed)
Laparoscopic Surgery Discharge Instructions  RISKS AND COMPLICATIONS   Infection.  Bleeding.  Injury to surrounding organs.  Anesthetic side effects.   PROCEDURE   You may be given a medicine to help you relax (sedative) before the procedure. You will be given a medicine to make you sleep (general anesthetic) during the procedure.  A tube will be put down your throat to help your breath while under general anesthesia.  Several small cuts (incisions) are made in the lower abdominal area and one incision is made near the belly button.  Your abdominal area will be inflated with a safe gas (carbon dioxide). This helps give the surgeon room to operate, visualize, and helps the surgeon avoid other organs.  A thin, lighted tube (laparoscope) with a camera attached is inserted into your abdomen through the incision near the belly button. Other small instruments may also be inserted through other abdominal incisions.  The ovary is located and are removed.  After the ovary is removed, the gas is released from the abdomen.  The incisions will be closed with stitches (sutures), and Dermabond. A bandage may be placed over the incisions.  AFTER THE PROCEDURE   You will also have some mild abdominal discomfort for 3-7 days. You will be given pain medicine to ease any discomfort.  You will likely also have vaginal bleeding for several days, as well as cramping. Ibuprofen 600mg  every 6 hours is good for helping with crampy pain.  As long as there are no problems, you may be allowed to go home. Someone will need to drive you home and be with you for at least 24 hours once home.  You may have some mild discomfort in the throat. This is from the tube placed in your throat while you were sleeping.  You may experience discomfort in the shoulder area from some trapped air between the liver and diaphragm. This sensation is normal and will slowly go away on its own.  HOME CARE INSTRUCTIONS   Take  all medicines as directed.  Only take over-the-counter or prescription medicines for pain, discomfort, or fever as directed by your caregiver.  Resume daily activities as directed.  Showers are preferred over baths for 2 weeks.  You may resume sexual activities in 1 week or as you feel you would like to.  Do not drive while taking narcotics.  SEEK MEDICAL CARE IF: .  There is increasing abdominal pain.  Your vaginal bleeding is more than 1 pad per hour for two hours or more.  You feel lightheaded or faint.  You have the chills.  You have an oral temperature above 102 F (38.9 C).  There is pus-like (purulent) drainage from any of the wounds or your vaginal  You are unable to pass gas or have a bowel movement.  You feel sick to your stomach (nauseous) or throw up (vomit) and can't control it with your medicines.  MAKE SURE YOU:   Understand these instructions.  Will watch your condition.  Will get help right away if you are not doing well or get worse.  ExitCare Patient Information 2013 [place]     AMBULATORY SURGERY  DISCHARGE INSTRUCTIONS   1) The drugs that you were given will stay in your system until tomorrow so for the next 24 hours you should not:  A) Drive an automobile B) Make any legal decisions C) Drink any alcoholic beverage   2) You may resume regular meals tomorrow.  Today it is better to start  with liquids and gradually work up to solid foods.  You may eat anything you prefer, but it is better to start with liquids, then soup and crackers, and gradually work up to solid foods.   3) Please notify your doctor immediately if you have any unusual bleeding, trouble breathing, redness and pain at the surgery site, drainage, fever, or pain not relieved by medication.    4) Additional Instructions:        Please contact your physician with any problems or Same Day Surgery at [phone], Monday through Friday 6 am to 4 pm, or  [place] [facility] number at [phone].

## 2015-02-09 NOTE — ED Notes (Addendum)
Went to stand pt to check bp. On standing, abd pain increased to 10/10 due to cramping and blood ran down legs to floor. Pt able to walk to toilet with assistance. States that she was a little dizzy with standing. Dr Mayford Knifewilliams notified

## 2015-02-09 NOTE — Transfer of Care (Signed)
Immediate Anesthesia Transfer of Care Note  Patient: Jeanne [name]  Procedure(s) Performed: Procedure(s): DILATATION AND EVACUATION (N/A) LAPAROSCOPY DIAGNOSTIC  Patient Location: PACU  Anesthesia Type:General  Level of Consciousness: sedated  Airway & Oxygen Therapy: Patient Spontanous Breathing and Patient connected to nasal cannula oxygen  Post-op Assessment: Report given to RN and Post -op Vital signs reviewed and stable  Post vital signs: Reviewed and stable  Last Vitals:  Filed Vitals:   02/09/15 1342 02/09/15 1507  BP: 100/68   Pulse: 70   Temp: 37.6 C 36.6 C  Resp: 16     Complications: No apparent anesthesia complications

## 2015-02-09 NOTE — Anesthesia Procedure Notes (Signed)
Procedure Name: Intubation Date/Time: 02/09/2015 2:05 PM Performed by: Ginger CarneMICHELET, Jeanne [name] Pre-anesthesia Checklist: Patient identified, Emergency Drugs available, Suction available and Patient being monitored Patient Re-evaluated:Patient Re-evaluated prior to inductionOxygen Delivery Method: Circle system utilized Preoxygenation: Pre-oxygenation with 100% oxygen Intubation Type: IV induction Ventilation: Mask ventilation without difficulty Laryngoscope Size: Miller and 2 Grade View: Grade I Tube type: Oral Tube size: 7.0 mm Number of attempts: 1 Placement Confirmation: ETT inserted through vocal cords under direct vision,  positive ETCO2 and breath sounds checked- equal and bilateral Secured at: 21 cm Tube secured with: Tape Dental Injury: Teeth and Oropharynx as per pre-operative assessment

## 2015-02-09 NOTE — Op Note (Signed)
Operative Report Suction Dilation and Curettage, diagnostic laparoscopy   Indications: Incomplete AB vs PUL   Pre-operative Diagnosis: Missed abortion at 6 weeks; heavy vaginal bleeding  Post-operative Diagnosis: same.  Procedure: 1. Suction D&C 2. Diagnostic laparoscopy  Surgeon: [name], MD  Assistant(s):  Donny Pique, CST  Anesthesia: General endotracheal anesthesia  Estimated Blood Loss:  25mL         Intraoperative medications: 0.2mg  IM methergine         Total IV Fluids:  Urine Output:         Specimens: Products of conception         Complications:  None; patient tolerated the procedure well.         Disposition: PACU - hemodynamically stable.         Condition: stable  Findings: Uterus measuring 6 week on bimanual exam; open cervix with dark vaginal bleeding, normal vagina, perineum. Uterine evacuation products were floated in water and no placental fronding was noted. Because of left-sided pelvic pain with inability to visualize left ovary, the decision was made to proceed with dx lap. Insufflated CO2 gas was noted in the omentum overlying the bowel below the site of the veress insertion; this was evaluated and the bowel underneath directly visualized, with no evidence of bowel puncture - no bleeding, no bile or leakage of bowel contents were noted. General surgery was asked to step in to visualize site - they did not scrub and were satisfied that no injury was noted.  The full left and right upper quadrants were visualized and were normal.   Indication for procedure/Consents: 24 y.o. G3P1011 at approx 6-[redacted] weeks gestational age here for add-on surgery for the aforementioned diagnoses.  Risks of surgery were discussed with the patient including but not limited to: bleeding which may require transfusion; infection which may require antibiotics; injury to uterus or surrounding organs; intrauterine scarring which may impair future fertility;  need for additional procedures including laparotomy; and other postoperative/anesthesia complications. Written informed consent was obtained.    Procedure Details:   The patient received oral antibiotics while in the preoperative area.  She was then taken to the operating room where general anesthesia was administered and was found to be adequate.  After a formal and adequate timeout was performed, she was placed in the dorsal lithotomy position and examined with the above findings. She was then prepped and draped in the sterile manner.   Her bladder was catheterized for an estimated amount of clear, yellow urine. A speculum was then placed in the patient's vagina and a single tooth tenaculum was applied to the anterior lip of the cervix.    No uterine sounding was performed on this pregnant uterus. Her cervix was already dilated enough to accommodate a 7mm sized flexible suction curette.  A sharp curettage was then performed until there was a gritty texture in all four quadrants.  The tenaculum was removed from the anterior lip of the cervix and the vaginal speculum was removed after noting good hemostasis. A sponge stick was placed in the vagina. 0.2 mg of IM methergine was given to assist in hemostasis. Bleeding was moderate and quickly improved.  Attention was turned to the abdomen where an umbilical incision was made with the scalpel.  A veress needle was advanced and abdomen was then insufflated with carbon dioxide gas and adequate pneumoperitoneum was obtained. The Optiview 5-mm trocar and sleeve were then advanced without difficulty with the laparoscope under direct visualization  into the abdomen.  A detailed survey of the patient's pelvis and abdomen revealed the findings as mentioned above.  The operative site was surveyed, and it was found to be hemostatic.  No intraoperative injury to surrounding organs was noted.  Pictures were taken of the quadrants and pelvis.   A 5mm RLQ port was placed  under direct visualization. The uterus was elevated and both adnexa, including ovaries and fallopian tubes, were visualized. No evidence of ectopic pregnancy was noted. The anterior and posterior culdesacs were also noted to be normal, with small amount of physiologic hematogenous fluid noted.   The abdomen was desufflated and all instruments were then removed from the patient's abdomen. The uterine manipulator was removed without complications.  All incisions were closed with 4-0 Vicryl and Dermabond.   The patient tolerated the procedure well and was taken to the recovery area awake, extubated and in stable condition.  The patient will be discharged to home as per PACU criteria.  She will receive another dose of oral antibiotics prior to discharge. Routine postoperative instructions given.  She was prescribed Percocet, Ibuprofen and Colace.  She will follow up in the clinic in two weeks for postoperative evaluation.

## 2015-02-09 NOTE — Anesthesia Preprocedure Evaluation (Addendum)
Anesthesia Evaluation  Patient identified by MRN, date of birth, ID band Patient awake    Reviewed: Allergy & Precautions, NPO status , Patient's Chart, lab work & pertinent test results  Airway Mallampati: I  TM Distance: >3 FB Neck ROM: Full    Dental  (+) Teeth Intact   Pulmonary    Pulmonary exam normal        Cardiovascular Exercise Tolerance: Good negative cardio ROS Normal cardiovascular exam     Neuro/Psych    GI/Hepatic negative GI ROS, Neg liver ROS,   Endo/Other  negative endocrine ROS  Renal/GU negative Renal ROS     Musculoskeletal   Abdominal   Peds  Hematology  (+) anemia , Hb 10.   Anesthesia Other Findings Early pregnancy--incomplete abortion, less than 14 weeks by U/S.  Reproductive/Obstetrics                            Anesthesia Physical Anesthesia Plan  ASA: II and emergent  Anesthesia Plan: General   Post-op Pain Management:    Induction: Intravenous  Airway Management Planned: Oral ETT  Additional Equipment:   Intra-op Plan:   Post-operative Plan: Extubation in OR  Informed Consent: I have reviewed the patients History and Physical, chart, labs and discussed the procedure including the risks, benefits and alternatives for the proposed anesthesia with the patient or authorized representative who has indicated his/her understanding and acceptance.     Plan Discussed with: CRNA  Anesthesia Plan Comments:         Anesthesia Quick Evaluation

## 2015-02-09 NOTE — ED Notes (Signed)
Report given to or. Pt consented by MD. Pt transported to or by orderly. Husband took all belongings including yellow ring.

## 2015-02-09 NOTE — ED Provider Notes (Addendum)
[facility] Emergency Department Provider Note  ____________________________________________  Time seen: 6:30 AM   I have reviewed the triage vital signs and the nursing notes.   HISTORY  Chief Complaint Vaginal Bleeding    HPI Jeanne [name] is a 24 y.o. female presents with heavy vaginal bleeding which started on Friday which has progressively worsened up until today. Of note patient's has a history of a miscarriage which occurred in October. Patient admits to going through multiple pads today unclear the amount. On presentation to the emergency department the patient noted to be hypotensive with systolic blood pressure 69 with a diastolic of 30 heart rate. Patient also admits to 10 out of 10 pelvic pain.    past medical history   Miscarriage in October 2016 There are no active problems to display for this patient.   Past surgical history none No current outpatient prescriptions on file.  Allergies None No family history on file.  Social History Social History  Substance Use Topics  . Smoking status: Never Smoker   . Smokeless tobacco: Never Used  . Alcohol Use: No    Review of Systems  Constitutional: Negative for fever. Eyes: Negative for visual changes. ENT: Negative for sore throat. Cardiovascular: Negative for chest pain. Respiratory: Negative for shortness of breath. Gastrointestinal: Positive for abdominal pain,negative vomiting and diarrhea. Genitourinary: Negative for dysuria. Heavy vaginal bleeding Musculoskeletal: Negative for back pain. Skin: Negative for rash. Neurological: Negative for headaches, focal weakness or numbness.   10-point ROS otherwise negative.  ____________________________________________   PHYSICAL EXAM:  VITAL SIGNS: ED Triage Vitals  Enc Vitals Group     BP 02/09/15 0615 69/30 mmHg     Pulse Rate 02/09/15 0615 57     Resp 02/09/15 0615 18     Temp 02/09/15 0615 98.4 F (36.9 C)      Temp Source 02/09/15 0615 Oral     SpO2 02/09/15 0615 97 %     Weight 02/09/15 0615 116 lb (52.617 kg)     Height 02/09/15 0615 5' (1.524 m)     Head Cir --      Peak Flow --      Pain Score 02/09/15 0615 10     Pain Loc --      Pain Edu? --      Excl. in GC? --     Constitutional: Alert and oriented. Well appearing and in no distress. Eyes: Conjunctivae are normal. PERRL. Normal extraocular movements. ENT   Head: Normocephalic and atraumatic.   Nose: No congestion/rhinnorhea.   Mouth/Throat: Mucous membranes are moist.   Neck: No stridor. Hematological/Lymphatic/Immunilogical: No cervical lymphadenopathy. Cardiovascular: Normal rate, regular rhythm. Normal and symmetric distal pulses are present in all extremities. No murmurs, rubs, or gallops. Respiratory: Normal respiratory effort without tachypnea nor retractions. Breath sounds are clear and equal bilaterally. No wheezes/rales/rhonchi. Gastrointestinal: Suprapubic tenderness to palpation. No distention. There is no CVA tenderness. Genitourinary: deferred Musculoskeletal: Nontender with normal range of motion in all extremities. No joint effusions.  No lower extremity tenderness nor edema. Neurologic:  Normal speech and language. No gross focal neurologic deficits are appreciated. Speech is normal.  Skin:  Skin is warm, dry and intact. Pallor Psychiatric: Mood and affect are normal. Speech and behavior are normal. Patient exhibits appropriate insight and judgment.  ____________________________________________    LABS (pertinent positives/negatives)  Labs Reviewed  CBC WITH DIFFERENTIAL/PLATELET - Abnormal; Notable for the following:    WBC 17.1 (*)    RBC 3.75 (*)  Hemoglobin 11.6 (*)    HCT 34.1 (*)    Neutro Abs 14.0 (*)    All other components within normal limits  BASIC METABOLIC PANEL - Abnormal; Notable for the following:    Potassium 3.2 (*)    CO2 21 (*)    Glucose, Bld 110 (*)    Calcium 8.5 (*)     All other components within normal limits  HCG, QUANTITATIVE, PREGNANCY - Abnormal; Notable for the following:    hCG, Beta Chain, Quant, S 1494 (*)    All other components within normal limits  CBC WITH DIFFERENTIAL/PLATELET - Abnormal; Notable for the following:    WBC 12.7 (*)    RBC 3.46 (*)    Hemoglobin 10.1 (*)    HCT 31.0 (*)    Neutro Abs 11.0 (*)    All other components within normal limits  POCT PREGNANCY, URINE - Abnormal; Notable for the following:    Preg Test, Ur POSITIVE (*)    All other components within normal limits  POC URINE PREG, ED  TYPE AND SCREEN  ABO/RH  SURGICAL PATHOLOGY        RADIOLOGY   US Ob Transvaginal (Final result) Result time: 02/09/15 08:48:54   Final result by Rad Results In Interface (02/09/15 08:48:54)   Narrative:   CLINICAL DATA: First trimester of pregnancy, left lower quadrant abdominal pain for 2 weeks.  EXAM: OBSTETRIC <14 WK [place] AND TRANSVAGINAL OB US  TECHNIQUE: Both transabdominal and transvaginal ultrasound examinations were performed for complete evaluation of the gestation as well as the maternal uterus, adnexal regions, and pelvic cul-de-sac. Transvaginal technique was performed to assess early pregnancy.  COMPARISON: Pelvic ultrasound of December 13, 2014.  FINDINGS: Intrauterine gestational sac: Possible gestational sac is noted.  Yolk sac: Not visualized.  Embryo: Not visualized.  Cardiac Activity: Not visualized.  Maternal uterus/adnexae: Trace free fluid is noted which most likely is physiologic. Left ovary not visualized due to overlying bowel gas. Probable corpus luteum cyst seen in right ovary.  IMPRESSION: Possible early intrauterine gestational sac, but no definite yolk sac, fetal pole, or cardiac activity yet visualized. Recommend follow-up quantitative B-HCG levels and follow-up US in 14 days to confirm and assess viability. This recommendation follows SRU consensus guidelines:  Diagnostic Criteria for Nonviable Pregnancy Early in the First Trimester. Malva Limes Med 2013; 161:0960-45.   Electronically Signed By: Lupita Raider, M.D. On: 02/09/2015 08:48          US OB Comp Less 14 Wks (Final result) Result time: 02/09/15 08:48:54   Final result by Rad Results In Interface (02/09/15 08:48:54)   Narrative:   CLINICAL DATA: First trimester of pregnancy, left lower quadrant abdominal pain for 2 weeks.  EXAM: OBSTETRIC <14 WK [place] AND TRANSVAGINAL OB US  TECHNIQUE: Both transabdominal and transvaginal ultrasound examinations were performed for complete evaluation of the gestation as well as the maternal uterus, adnexal regions, and pelvic cul-de-sac. Transvaginal technique was performed to assess early pregnancy.  COMPARISON: Pelvic ultrasound of December 13, 2014.  FINDINGS: Intrauterine gestational sac: Possible gestational sac is noted.  Yolk sac: Not visualized.  Embryo: Not visualized.  Cardiac Activity: Not visualized.  Maternal uterus/adnexae: Trace free fluid is noted which most likely is physiologic. Left ovary not visualized due to overlying bowel gas. Probable corpus luteum cyst seen in right ovary.  IMPRESSION: Possible early intrauterine gestational sac, but no definite yolk sac, fetal pole, or cardiac activity yet visualized. Recommend follow-up quantitative B-HCG levels and follow-up  US in 14 days to confirm and assess viability. This recommendation follows SRU consensus guidelines: Diagnostic Criteria for Nonviable Pregnancy Early in the First Trimester. Malva Limes Med 2013; 161:0960-45.   Electronically Signed By: Lupita Raider, M.D. On: 02/09/2015 08:48      Critical care:CRITICAL CARE Performed by: Bayard Males N   Total critical care time: 30 minutes  Critical care time was exclusive of separately billable procedures and treating other patients.  Critical care was necessary to treat or prevent  imminent or life-threatening deterioration.  Critical care was time spent personally by me on the following activities: development of treatment plan with patient and/or surrogate as well as nursing, discussions with consultants, evaluation of patient's response to treatment, examination of patient, obtaining history from patient or surrogate, ordering and performing treatments and interventions, ordering and review of laboratory studies, ordering and review of radiographic studies, pulse oximetry and re-evaluation of patient's condition.      INITIAL IMPRESSION / ASSESSMENT AND PLAN / ED COURSE  Pertinent labs & imaging results that were available during my care of the patient were reviewed by me and considered in my medical decision making (see chart for details). Given history and physical exam hypotension and pallor on presentations concern for hemorrhagic shock as such patient received 2 L IV normal saline on presentation to the room. Patient's care transferred to Dr. Mayford Knife  ____________________________________________   FINAL CLINICAL IMPRESSION(S) / ED DIAGNOSES  Final diagnoses:  Miscarriage  Leukocytosis      Darci Current, MD 02/10/15 [id number]  Darci Current, MD 02/24/15 1191  Darci Current, MD 03/01/15 [phone]

## 2015-02-09 NOTE — ED Notes (Signed)
Pt in with heavy vaginal bleeding, started last Friday and became heavy today.  Had miscarriage in October.

## 2015-02-10 ENCOUNTER — Encounter: Payer: Self-pay | Admitting: Obstetrics and Gynecology

## 2015-02-11 LAB — SURGICAL PATHOLOGY

## 2015-03-11 NOTE — ED Provider Notes (Signed)
[facility] Emergency Department Provider Note  ____________________________________________  Time seen: 6:20 AM  I have reviewed the triage vital signs and the nursing notes.   HISTORY  Chief Complaint Vaginal Bleeding     HPI Jeanne [name] is a 25 y.o. female with history of miscarriage October 2016 presents with heavy vaginal bleeding which started last Friday with acute worsening today. Patient also admits to pelvic pain that is currently 10 out of 10. Patient describes vaginal bleeding as "heavy"     Past medical history Miscarriage October 2016  There are no active problems to display for this patient.   Past Surgical History  Procedure Laterality Date  . Dilation and evacuation N/A 02/09/2015    Procedure: DILATATION AND EVACUATION;  Surgeon: [name], MD;  Location: [facility] ORS;  Service: Gynecology;  Laterality: N/A;  . Laparoscopy  02/09/2015    Procedure: LAPAROSCOPY DIAGNOSTIC;  Surgeon: [name], MD;  Location: [facility] ORS;  Service: Gynecology;;    Current Outpatient Rx  Name  Route  Sig  Dispense  Refill  . norethindrone-ethinyl estradiol (JUNEL FE,GILDESS FE,LOESTRIN FE) 1-20 MG-MCG tablet   Oral   Take 1 tablet by mouth daily.         Marland Kitchen ibuprofen (ADVIL,MOTRIN) 800 MG tablet   Oral   Take 1 tablet (800 mg total) by mouth every 8 (eight) hours as needed for moderate pain or cramping.   30 tablet   1   . ondansetron (ZOFRAN ODT) 4 MG disintegrating tablet   Oral   Take 1 tablet (4 mg total) by mouth every 6 (six) hours as needed for nausea.   20 tablet   0   . oxyCODONE-acetaminophen (PERCOCET/ROXICET) 5-325 MG tablet   Oral   Take 1-2 tablets by mouth every 6 (six) hours as needed for moderate pain (You may take with ibuprofen).   30 tablet   0     Allergies Review of patient's allergies indicates no known allergies.  No family history on file.  Social History Social History  Substance Use  Topics  . Smoking status: Never Smoker   . Smokeless tobacco: Never Used  . Alcohol Use: No    Review of Systems  Constitutional: Negative for fever. Eyes: Negative for visual changes. ENT: Negative for sore throat. Cardiovascular: Negative for chest pain. Respiratory: Negative for shortness of breath. Gastrointestinal: Negative for abdominal pain, vomiting and diarrhea. Positive for pelvic pain Genitourinary: Negative for dysuria. Positive for vaginal bleeding Musculoskeletal: Negative for back pain. Skin: Negative for rash. Neurological: Negative for headaches, focal weakness or numbness.  10-point ROS otherwise negative.  ____________________________________________   PHYSICAL EXAM:  VITAL SIGNS: ED Triage Vitals  Enc Vitals Group     BP 02/09/15 0615 69/30 mmHg     Pulse Rate 02/09/15 0615 57     Resp 02/09/15 0615 18     Temp 02/09/15 0615 98.4 F (36.9 C)     Temp Source 02/09/15 0615 Oral     SpO2 02/09/15 0615 97 %     Weight 02/09/15 0615 116 lb (52.617 kg)     Height 02/09/15 0615 5' (1.524 m)     Head Cir --      Peak Flow --      Pain Score 02/09/15 0615 10     Pain Loc --      Pain Edu? --      Excl. in GC? --      Constitutional: Alert and oriented.  Well appearing and in no distress. Eyes: Conjunctivae are normal. PERRL. Normal extraocular movements. ENT   Head: Normocephalic and atraumatic.   Nose: No congestion/rhinnorhea.   Mouth/Throat: Mucous membranes are moist.   Neck: No stridor. Hematological/Lymphatic/Immunilogical: No cervical lymphadenopathy. Cardiovascular: Normal rate, regular rhythm. Normal and symmetric distal pulses are present in all extremities. No murmurs, rubs, or gallops. Respiratory: Normal respiratory effort without tachypnea nor retractions. Breath sounds are clear and equal bilaterally. No wheezes/rales/rhonchi. Gastrointestinal: Suprapubic tenderness to palpation No distention. There is no CVA  tenderness. Genitourinary: deferred Musculoskeletal: Nontender with normal range of motion in all extremities. No joint effusions.  No lower extremity tenderness nor edema. Neurologic:  Normal speech and language. No gross focal neurologic deficits are appreciated. Speech is normal.  Skin:  Skin is warm, dry and intact. No rash noted. Psychiatric: Mood and affect are normal. Speech and behavior are normal. Patient exhibits appropriate insight and judgment.  ____________________________________________    LABS (pertinent positives/negatives)  Labs Reviewed  CBC WITH DIFFERENTIAL/PLATELET - Abnormal; Notable for the following:    WBC 17.1 (*)    RBC 3.75 (*)    Hemoglobin 11.6 (*)    HCT 34.1 (*)    Neutro Abs 14.0 (*)    All other components within normal limits  BASIC METABOLIC PANEL - Abnormal; Notable for the following:    Potassium 3.2 (*)    CO2 21 (*)    Glucose, Bld 110 (*)    Calcium 8.5 (*)    All other components within normal limits  HCG, QUANTITATIVE, PREGNANCY - Abnormal; Notable for the following:    hCG, Beta Chain, Quant, S 1494 (*)    All other components within normal limits  CBC WITH DIFFERENTIAL/PLATELET - Abnormal; Notable for the following:    WBC 12.7 (*)    RBC 3.46 (*)    Hemoglobin 10.1 (*)    HCT 31.0 (*)    Neutro Abs 11.0 (*)    All other components within normal limits  POCT PREGNANCY, URINE - Abnormal; Notable for the following:    Preg Test, Ur POSITIVE (*)    All other components within normal limits  POC URINE PREG, ED  TYPE AND SCREEN  ABO/RH  SURGICAL PATHOLOGY      RADIOLOGY     US Ob Transvaginal (Final result) Result time: 02/09/15 08:48:54   Final result by Rad Results In Interface (02/09/15 08:48:54)   Narrative:   CLINICAL DATA: First trimester of pregnancy, left lower quadrant abdominal pain for 2 weeks.  EXAM: OBSTETRIC <14 WK [place] AND TRANSVAGINAL OB US  TECHNIQUE: Both transabdominal and transvaginal  ultrasound examinations were performed for complete evaluation of the gestation as well as the maternal uterus, adnexal regions, and pelvic cul-de-sac. Transvaginal technique was performed to assess early pregnancy.  COMPARISON: Pelvic ultrasound of December 13, 2014.  FINDINGS: Intrauterine gestational sac: Possible gestational sac is noted.  Yolk sac: Not visualized.  Embryo: Not visualized.  Cardiac Activity: Not visualized.  Maternal uterus/adnexae: Trace free fluid is noted which most likely is physiologic. Left ovary not visualized due to overlying bowel gas. Probable corpus luteum cyst seen in right ovary.  IMPRESSION: Possible early intrauterine gestational sac, but no definite yolk sac, fetal pole, or cardiac activity yet visualized. Recommend follow-up quantitative B-HCG levels and follow-up US in 14 days to confirm and assess viability. This recommendation follows SRU consensus guidelines: Diagnostic Criteria for Nonviable Pregnancy Early in the First Trimester. Malva Limes Med 2013; 409:8119-14.  Electronically Signed By: Lupita RaiderJames Green Jr, M.D. On: 02/09/2015 08:48          US OB Comp Less 14 Wks (Final result) Result time: 02/09/15 08:48:54   Final result by Rad Results In Interface (02/09/15 08:48:54)   Narrative:   CLINICAL DATA: First trimester of pregnancy, left lower quadrant abdominal pain for 2 weeks.  EXAM: OBSTETRIC <14 WK US AND TRANSVAGINAL OB US  TECHNIQUE: Both transabdominal and transvaginal ultrasound examinations were performed for complete evaluation of the gestation as well as the maternal uterus, adnexal regions, and pelvic cul-de-sac. Transvaginal technique was performed to assess early pregnancy.  COMPARISON: Pelvic ultrasound of December 13, 2014.  FINDINGS: Intrauterine gestational sac: Possible gestational sac is noted.  Yolk sac: Not visualized.  Embryo: Not visualized.  Cardiac Activity: Not  visualized.  Maternal uterus/adnexae: Trace free fluid is noted which most likely is physiologic. Left ovary not visualized due to overlying bowel gas. Probable corpus luteum cyst seen in right ovary.  IMPRESSION: Possible early intrauterine gestational sac, but no definite yolk sac, fetal pole, or cardiac activity yet visualized. Recommend follow-up quantitative B-HCG levels and follow-up US in 14 days to confirm and assess viability. This recommendation follows SRU consensus guidelines: Diagnostic Criteria for Nonviable Pregnancy Early in the First Trimester. Malva Limes Engl J Med 2013; 161:0960-45; 369:1443-51.   Electronically Signed By: Lupita RaiderJames Green Jr, M.D. On: 02/09/2015 08:48          INITIAL IMPRESSION / ASSESSMENT AND PLAN / ED COURSE  Pertinent labs & imaging results that were available during my care of the patient were reviewed by me and considered in my medical decision making (see chart for details).  Patient's care transferred to Dr. Mayford KnifeWilliams with ultrasound pending. ____________________________________________   FINAL CLINICAL IMPRESSION(S) / ED DIAGNOSES  Final diagnoses:  Miscarriage  Leukocytosis      Darci Currentandolph N Sinclair All[place]ood, MD 03/11/15 [phone]

## 2016-02-27 NOTE — L&D Delivery Note (Signed)
Date of delivery: 10/17/16 Estimated Date of Delivery: 10/12/16 Patient's last menstrual period was 01/06/2016 (approximate). EGA: [redacted]w[redacted]d  Delivery Note At 4:40 PM a viable female was delivered via Vaginal, Spontaneous Delivery (Presentation: cephalic; direct OA).  APGAR: , ; weight 8 lb 8.9 oz (3880 g).   Placenta status: spontaneous, intact.  Cord: 3 vessels, with the following complications: nuchal, tight, x1.  Cord pH: arterial and venous pending  Anesthesia:  none Episiotomy:  no Lacerations: 1st degree Suture Repair: 3.0 vicryl rapide Est. Blood Loss (mL): 905cc (measured)  Mom presented to L&D with labor. AROM for clear fluid, Progressed to complete, second stage: <30 mins, with delivery of fetal head with restitution to ROA.   Anterior then posterior shoulders delivered without difficulty.  Baby placed on mom's chest, and attended to by peds, who requested cord to be cut.  Cord was then clamped and cut by FOB.  A segment of cord was cut for gas collection.  Cord blood was collected for ABO typing.  Placenta spontaneously delivered, intact.   IV pitocin given for hemorrhage prophylaxis, then IM methergine   We sang happy birthday to baby Jeanne [name].   Mom to postpartum.  Baby to Couplet care / Skin to Skin.  Jeanne [name] Jeanne [name] Jeanne [name] 10/17/2016, 5:06 PM

## 2016-03-26 ENCOUNTER — Other Ambulatory Visit: Payer: Self-pay | Admitting: Advanced Practice Midwife

## 2016-03-26 DIAGNOSIS — Z3482 Encounter for supervision of other normal pregnancy, second trimester: Secondary | ICD-10-CM

## 2016-03-26 LAB — OB RESULTS CONSOLE HIV ANTIBODY (ROUTINE TESTING): HIV: NONREACTIVE

## 2016-03-27 LAB — OB RESULTS CONSOLE RUBELLA ANTIBODY, IGM: RUBELLA: IMMUNE

## 2016-03-27 LAB — OB RESULTS CONSOLE VARICELLA ZOSTER ANTIBODY, IGG: Varicella: IMMUNE

## 2016-03-27 LAB — OB RESULTS CONSOLE HEPATITIS B SURFACE ANTIGEN: Hepatitis B Surface Ag: NEGATIVE

## 2016-03-27 LAB — OB RESULTS CONSOLE RPR: RPR: NONREACTIVE

## 2016-03-30 ENCOUNTER — Ambulatory Visit
Admission: RE | Admit: 2016-03-30 | Discharge: 2016-03-30 | Disposition: A | Discharge status: Home / Self Care | Payer: BLUE CROSS/BLUE SHIELD | Source: Ambulatory Visit | Attending: Advanced Practice Midwife | Admitting: Advanced Practice Midwife

## 2016-03-30 DIAGNOSIS — Z3A12 12 weeks gestation of pregnancy: Secondary | ICD-10-CM | POA: Insufficient documentation

## 2016-03-30 DIAGNOSIS — Z3481 Encounter for supervision of other normal pregnancy, first trimester: Secondary | ICD-10-CM | POA: Diagnosis not present

## 2016-03-30 DIAGNOSIS — Z3482 Encounter for supervision of other normal pregnancy, second trimester: Secondary | ICD-10-CM

## 2016-03-30 IMAGING — US US OB COMP LESS 14 WK
1 series · 14 of 28 positions shown · non-contrast
Comparison: None.

CLINICAL DATA: Measuring large for dates. Gestational age by LMP of
12 weeks 0 days.

EXAM:
OBSTETRIC <14 WK ULTRASOUND
TECHNIQUE: Transabdominal ultrasound was performed for evaluation of the
gestation as well as the maternal uterus and adnexal regions.

[Series 1: us ob comp less 14 wk · 0.18mm/px · 14 of 72 slices shown]
[im 3/72]
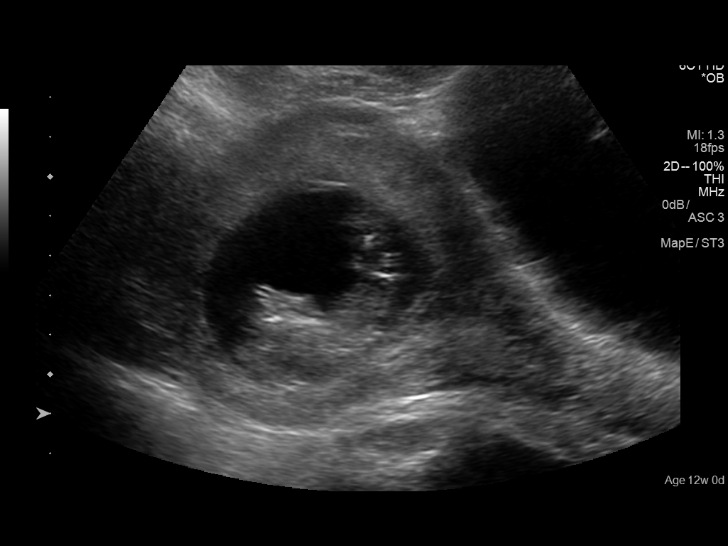
[im 8/72]
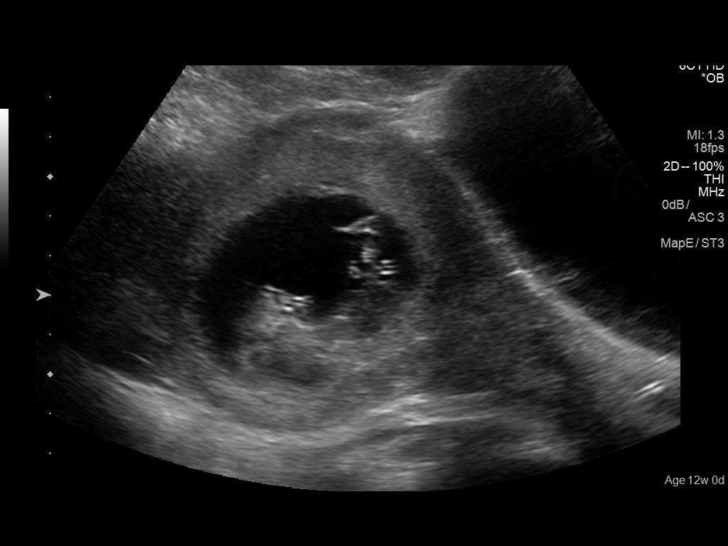
[im 14/72]
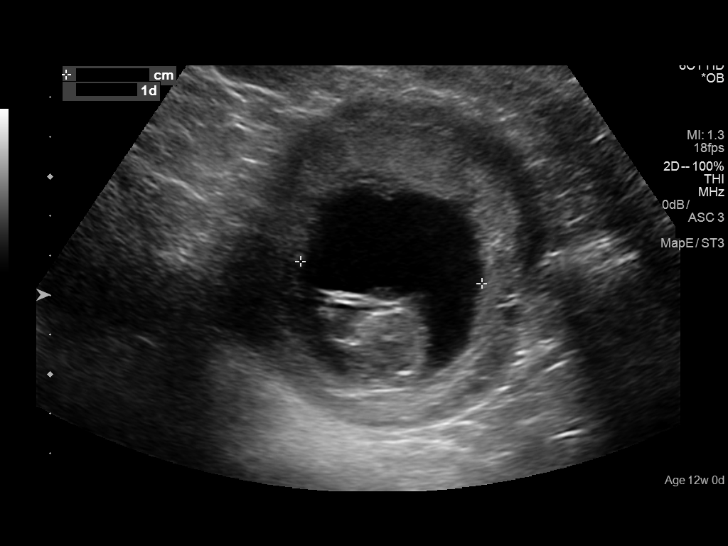
[im 19/72]
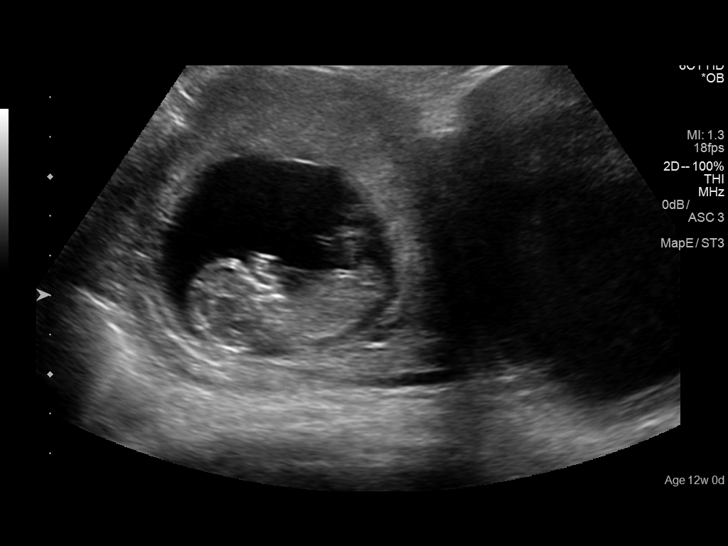
[im 24/72]
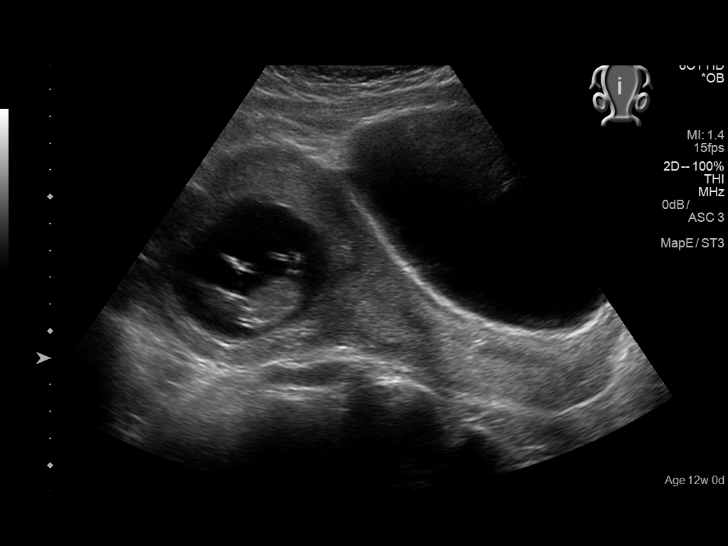
[im 29/72]
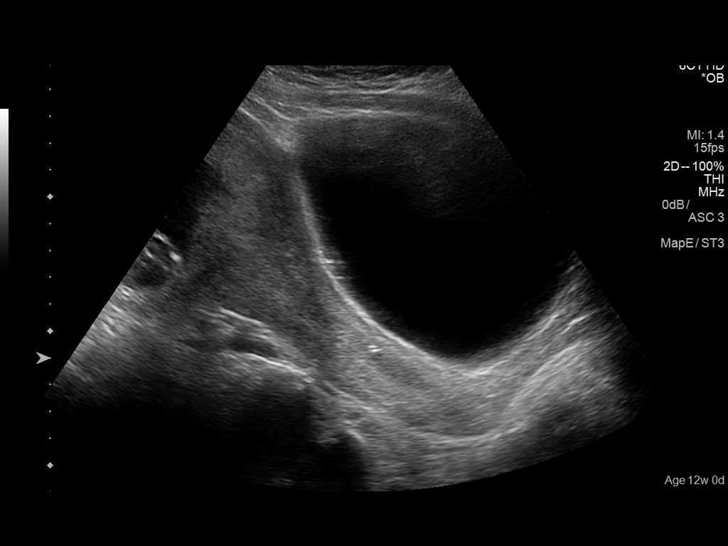
[im 35/72]
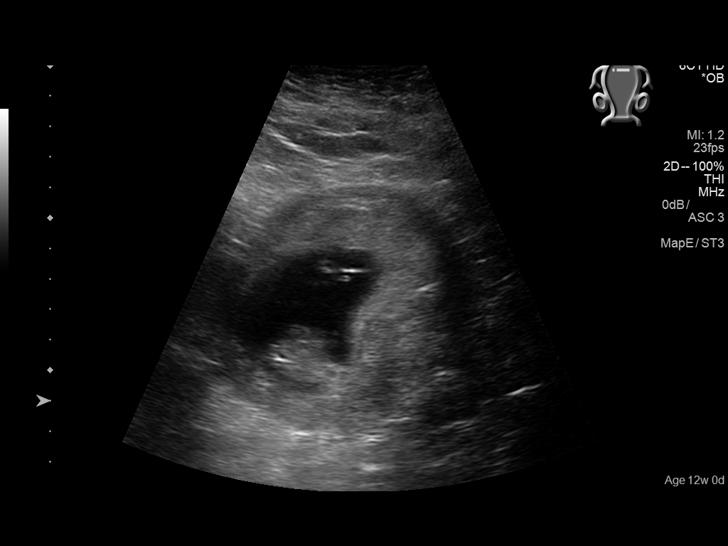
[im 40/72]
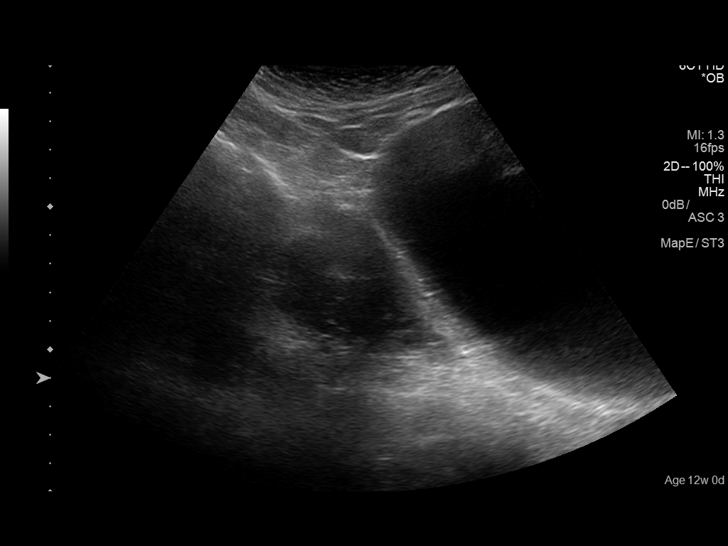
[im 45/72]
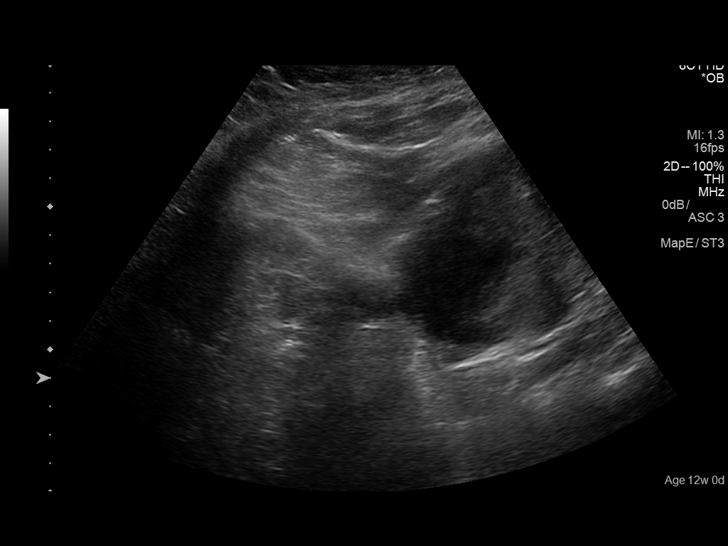
[im 50/72]
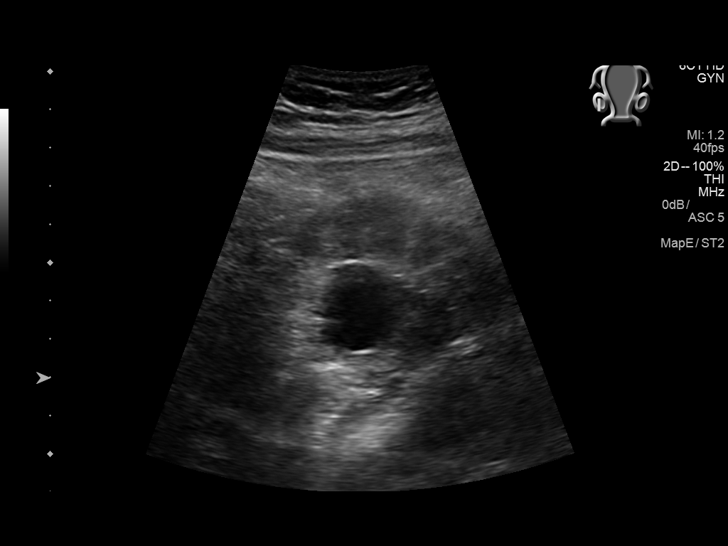
[im 56/72]
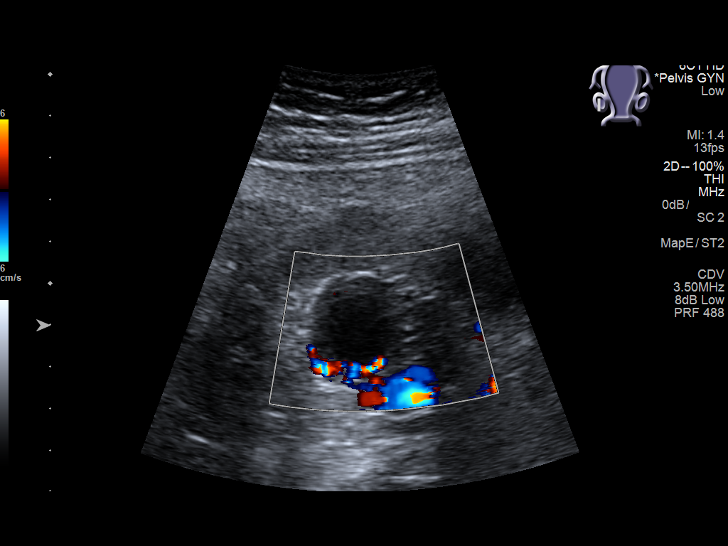
[im 61/72]
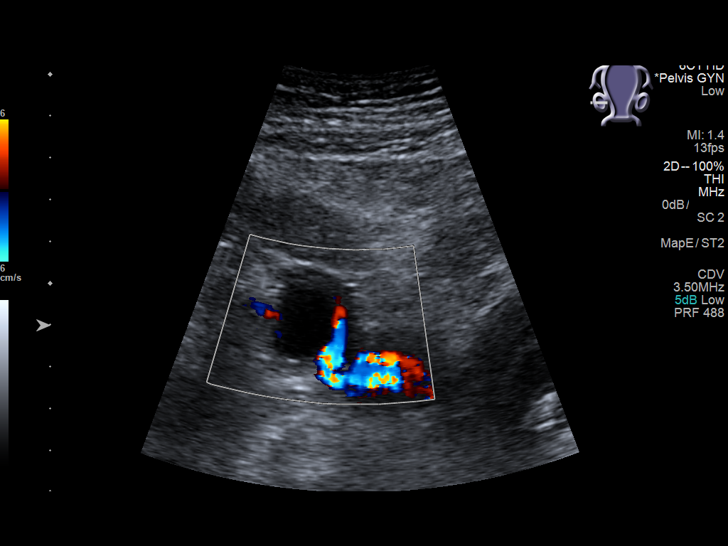
[im 66/72]
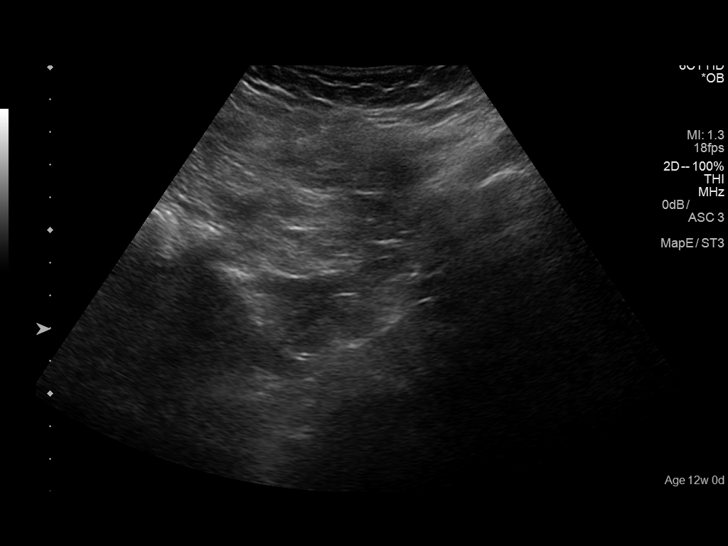
[im 72/72]
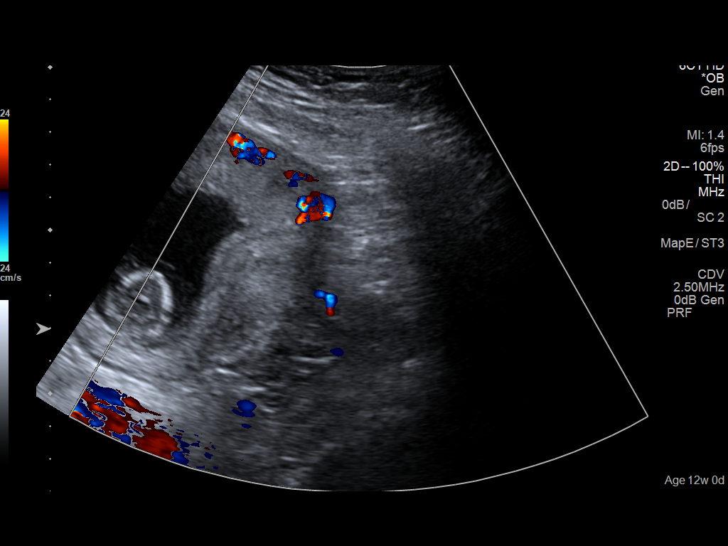

[14 of 28 positions shown; findings below may reference images not displayed]

FINDINGS: Intrauterine gestational sac: Single

Yolk sac:  Not Visualized.

Embryo:  Visualized.

Cardiac Activity: Visualized.

Heart Rate: 168 bpm

CRL:   53  mm   12 w 0 d                  US EDC: 10/12/2016

Subchorionic hemorrhage:  None visualized.

Maternal uterus/adnexae: Normal appearance of right ovary.
Nonvisualization of left ovary, however no adnexal mass or free
fluid identified.
IMPRESSION: Single living IUP measuring 12 weeks 0 days, with US EDC of
10/12/2016.

No significant maternal uterine or adnexal abnormality identified.

## 2016-07-17 LAB — OB RESULTS CONSOLE RPR: RPR: NONREACTIVE

## 2016-09-20 LAB — OB RESULTS CONSOLE GBS: STREP GROUP B AG: NEGATIVE

## 2016-10-17 ENCOUNTER — Inpatient Hospital Stay
Admission: EM | Admit: 2016-10-17 | Discharge: 2016-10-18 | DRG: 775 | Disposition: A | Discharge status: Home / Self Care | Payer: BLUE CROSS/BLUE SHIELD | Attending: Obstetrics & Gynecology | Admitting: Obstetrics & Gynecology

## 2016-10-17 ENCOUNTER — Encounter: Payer: Self-pay | Admitting: *Deleted

## 2016-10-17 DIAGNOSIS — O09299 Supervision of pregnancy with other poor reproductive or obstetric history, unspecified trimester: Secondary | ICD-10-CM

## 2016-10-17 DIAGNOSIS — D649 Anemia, unspecified: Secondary | ICD-10-CM | POA: Diagnosis present

## 2016-10-17 DIAGNOSIS — Z3490 Encounter for supervision of normal pregnancy, unspecified, unspecified trimester: Secondary | ICD-10-CM

## 2016-10-17 DIAGNOSIS — O9902 Anemia complicating childbirth: Principal | ICD-10-CM | POA: Diagnosis present

## 2016-10-17 DIAGNOSIS — Z3493 Encounter for supervision of normal pregnancy, unspecified, third trimester: Secondary | ICD-10-CM | POA: Diagnosis present

## 2016-10-17 DIAGNOSIS — Z3A40 40 weeks gestation of pregnancy: Secondary | ICD-10-CM

## 2016-10-17 DIAGNOSIS — Z8759 Personal history of other complications of pregnancy, childbirth and the puerperium: Secondary | ICD-10-CM

## 2016-10-17 DIAGNOSIS — Z862 Personal history of diseases of the blood and blood-forming organs and certain disorders involving the immune mechanism: Secondary | ICD-10-CM

## 2016-10-17 LAB — TYPE AND SCREEN
ABO/RH(D): O POS
Antibody Screen: NEGATIVE

## 2016-10-17 LAB — CBC
HCT: 38.6 % (ref 35.0–47.0)
HCT: 40.7 % (ref 35.0–47.0)
HEMOGLOBIN: 13 g/dL (ref 12.0–16.0)
Hemoglobin: 13.6 g/dL (ref 12.0–16.0)
MCH: 29.3 pg (ref 26.0–34.0)
MCH: 29.5 pg (ref 26.0–34.0)
MCHC: 33.6 g/dL (ref 32.0–36.0)
MCHC: 33.3 g/dL (ref 32.0–36.0)
MCV: 87.2 fL (ref 80.0–100.0)
MCV: 88.6 fL (ref 80.0–100.0)
Platelets: 133 10*3/uL — ABNORMAL LOW (ref 150–440)
Platelets: 134 10*3/uL — ABNORMAL LOW (ref 150–440)
RBC: 4.43 MIL/uL (ref 3.80–5.20)
RBC: 4.6 MIL/uL (ref 3.80–5.20)
RDW: 14.8 % — ABNORMAL HIGH (ref 11.5–14.5)
RDW: 14.7 % — AB (ref 11.5–14.5)
WBC: 8.1 10*3/uL (ref 3.6–11.0)
WBC: 15.2 10*3/uL — ABNORMAL HIGH (ref 3.6–11.0)

## 2016-10-17 MED ORDER — OXYTOCIN 10 UNIT/ML IJ SOLN
INTRAMUSCULAR | Status: AC
  Filled 2016-10-17: qty 2

## 2016-10-17 MED ORDER — METHYLERGONOVINE MALEATE 0.2 MG/ML IJ SOLN
INTRAMUSCULAR | Status: AC
  Administered 2016-10-17: 0.2 mg via INTRAMUSCULAR
  Filled 2016-10-17: qty 1

## 2016-10-17 MED ORDER — LIDOCAINE HCL (PF) 1 % IJ SOLN: 30.0000 mL | INTRAMUSCULAR | Status: DC | PRN

## 2016-10-17 MED ORDER — ONDANSETRON HCL 4 MG/2ML IJ SOLN: 4.0000 mg | Freq: Four times a day (QID) | INTRAMUSCULAR | Status: DC | PRN

## 2016-10-17 MED ORDER — ACETAMINOPHEN 500 MG PO TABS
1000.0000 mg | ORAL_TABLET | Freq: Four times a day (QID) | ORAL | Status: DC | PRN
  Administered 2016-10-18: 1000 mg via ORAL
  Filled 2016-10-17: qty 2

## 2016-10-17 MED ORDER — ONDANSETRON HCL 4 MG/2ML IJ SOLN: 4.0000 mg | INTRAMUSCULAR | Status: DC | PRN

## 2016-10-17 MED ORDER — DIPHENHYDRAMINE HCL 25 MG PO CAPS: 25.0000 mg | ORAL_CAPSULE | Freq: Four times a day (QID) | ORAL | Status: DC | PRN

## 2016-10-17 MED ORDER — WITCH HAZEL-GLYCERIN EX PADS
1.0000 | MEDICATED_PAD | CUTANEOUS | Status: DC
Start: 2016-10-17 — End: 2016-10-18
  Filled 2016-10-17: qty 100

## 2016-10-17 MED ORDER — METHYLERGONOVINE MALEATE 0.2 MG/ML IJ SOLN
0.2000 mg | Freq: Once | INTRAMUSCULAR | Status: AC
  Administered 2016-10-17: 0.2 mg via INTRAMUSCULAR

## 2016-10-17 MED ORDER — SOD CITRATE-CITRIC ACID 500-334 MG/5ML PO SOLN: 30.0000 mL | ORAL | Status: DC | PRN

## 2016-10-17 MED ORDER — OXYTOCIN 40 UNITS IN LACTATED RINGERS INFUSION - SIMPLE MED: 1.0000 m[IU]/min | INTRAVENOUS | Status: DC

## 2016-10-17 MED ORDER — IBUPROFEN 600 MG PO TABS
600.0000 mg | ORAL_TABLET | Freq: Four times a day (QID) | ORAL | Status: DC
  Administered 2016-10-17 – 2016-10-18 (×4): 600 mg via ORAL
  Filled 2016-10-17 (×4): qty 1

## 2016-10-17 MED ORDER — BENZOCAINE-MENTHOL 20-0.5 % EX AERO: 1.0000 "application " | INHALATION_SPRAY | CUTANEOUS | Status: DC | PRN

## 2016-10-17 MED ORDER — LACTATED RINGERS IV SOLN: 500.0000 mL | INTRAVENOUS | Status: DC | PRN

## 2016-10-17 MED ORDER — LIDOCAINE HCL (PF) 1 % IJ SOLN
INTRAMUSCULAR | Status: AC
  Filled 2016-10-17: qty 30

## 2016-10-17 MED ORDER — BUTORPHANOL TARTRATE 1 MG/ML IJ SOLN
1.0000 mg | INTRAMUSCULAR | Status: DC | PRN
  Administered 2016-10-17 (×2): 1 mg via INTRAVENOUS
  Filled 2016-10-17 (×2): qty 1

## 2016-10-17 MED ORDER — OXYTOCIN BOLUS FROM INFUSION
500.0000 mL | Freq: Once | INTRAVENOUS | Status: AC
  Administered 2016-10-17: 500 mL via INTRAVENOUS

## 2016-10-17 MED ORDER — MISOPROSTOL 200 MCG PO TABS
ORAL_TABLET | ORAL | Status: AC
  Filled 2016-10-17: qty 4

## 2016-10-17 MED ORDER — ONDANSETRON HCL 4 MG PO TABS: 4.0000 mg | ORAL_TABLET | ORAL | Status: DC | PRN

## 2016-10-17 MED ORDER — METHYLERGONOVINE MALEATE 0.2 MG/ML IJ SOLN
INTRAMUSCULAR | Status: AC
  Filled 2016-10-17: qty 1

## 2016-10-17 MED ORDER — IBUPROFEN 600 MG PO TABS
ORAL_TABLET | ORAL | Status: AC
  Administered 2016-10-17: 600 mg
  Filled 2016-10-17: qty 1

## 2016-10-17 MED ORDER — AMMONIA AROMATIC IN INHA
RESPIRATORY_TRACT | Status: AC
  Filled 2016-10-17: qty 10

## 2016-10-17 MED ORDER — HYDROCORTISONE 2.5 % RE CREA
TOPICAL_CREAM | RECTAL | Status: DC | PRN
  Filled 2016-10-17: qty 28.35

## 2016-10-17 MED ORDER — ACETAMINOPHEN 500 MG PO TABS: 1000.0000 mg | ORAL_TABLET | Freq: Four times a day (QID) | ORAL | Status: DC | PRN

## 2016-10-17 MED ORDER — DOCUSATE SODIUM 100 MG PO CAPS
100.0000 mg | ORAL_CAPSULE | Freq: Two times a day (BID) | ORAL | Status: DC
  Administered 2016-10-17 – 2016-10-18 (×2): 100 mg via ORAL
  Filled 2016-10-17 (×2): qty 1

## 2016-10-17 MED ORDER — OXYTOCIN 40 UNITS IN LACTATED RINGERS INFUSION - SIMPLE MED
2.5000 [IU]/h | INTRAVENOUS | Status: DC
  Administered 2016-10-17: 2.5 [IU]/h via INTRAVENOUS
  Filled 2016-10-17: qty 1000

## 2016-10-17 MED ORDER — LACTATED RINGERS IV SOLN
INTRAVENOUS | Status: DC
  Administered 2016-10-17: 09:00:00 via INTRAVENOUS

## 2016-10-17 MED ORDER — PRENATAL MULTIVITAMIN CH
1.0000 | ORAL_TABLET | Freq: Every day | ORAL | Status: DC
  Administered 2016-10-18: 1 via ORAL
  Filled 2016-10-17: qty 1

## 2016-10-17 MED ORDER — SIMETHICONE 80 MG PO CHEW: 80.0000 mg | CHEWABLE_TABLET | ORAL | Status: DC | PRN

## 2016-10-17 MED ORDER — COCONUT OIL OIL: 1.0000 "application " | TOPICAL_OIL | Status: DC | PRN

## 2016-10-17 NOTE — OB Triage Note (Signed)
Pt arrived from home with complaints of contractions every 7 min since midnight. Pt denies any leaking of fluid or vaginal bleeding. Pt reports positive fetal movement.

## 2016-10-17 NOTE — H&P (Signed)
OB History & Physical   History of Present Illness:  Chief Complaint:   HPI:  Cetera Ticer Glynn Laatsch is a 26 y.o. G48P1011 female at [redacted]w[redacted]d dated by LMP of 01/06/16 and consistent with 12wk [place] with Estimated Date of Delivery: 10/12/16 She presents to L&D with worsening and more frequent contractions  +FM, +CTX, no LOF, no VB  Pregnancy Issues: 1. Anemia 2. History of preeclampsia 3. History of post partum hemorrhage  Maternal Medical History:  History reviewed. No pertinent past medical history.  Past Surgical History:  Procedure Laterality Date  . DILATION AND EVACUATION N/A 02/09/2015   Procedure: DILATATION AND EVACUATION;  Surgeon: [name], MD;  Location: [facility] ORS;  Service: Gynecology;  Laterality: N/A;  . LAPAROSCOPY  02/09/2015   Procedure: LAPAROSCOPY DIAGNOSTIC;  Surgeon: [name], MD;  Location: [facility] ORS;  Service: Gynecology;;    No Known Allergies  Prior to Admission medications   Medication Sig Start Date End Date Taking? Authorizing Provider  ferrous sulfate 325 (65 FE) MG EC tablet Take 325 mg by mouth 3 (three) times daily with meals.   Yes [provider]  Prenatal Vit-Fe Fumarate-FA (MULTIVITAMIN-PRENATAL) 27-0.8 MG TABS tablet Take 1 tablet by mouth daily at 12 noon.   Yes [provider]  ibuprofen (ADVIL,MOTRIN) 800 MG tablet Take 1 tablet (800 mg total) by mouth every 8 (eight) hours as needed for moderate pain or cramping. Patient not taking: Reported on 10/17/2016 02/09/15   [name], MD  norethindrone-ethinyl estradiol (JUNEL FE,GILDESS FE,LOESTRIN FE) 1-20 MG-MCG tablet Take 1 tablet by mouth daily.    [provider]  ondansetron (ZOFRAN ODT) 4 MG disintegrating tablet Take 1 tablet (4 mg total) by mouth every 6 (six) hours as needed for nausea. Patient not taking: Reported on 10/17/2016 02/09/15   [name], MD  oxyCODONE-acetaminophen (PERCOCET/ROXICET) 5-325 MG tablet Take 1-2 tablets by mouth  every 6 (six) hours as needed for moderate pain (You may take with ibuprofen). Patient not taking: Reported on 10/17/2016 02/09/15   [name], MD     Prenatal care site: [facility]   Social History: She  reports that she has never smoked. She has never used smokeless tobacco. She reports that she does not drink alcohol or use drugs.  Family History: no gyn cancers  Review of Systems: A full review of systems was performed and negative except as noted in the HPI.     Physical Exam:  Vital Signs: BP 119/72 (BP Location: Left Arm)   Pulse 61   Temp 97.7 F (36.5 C) (Oral)   Resp 16   Ht 5' (1.524 m)   Wt 70.8 kg (156 lb)   LMP 01/06/2016 (Approximate)   BMI 30.47 kg/m  General: no acute distress.  HEENT: normocephalic, atraumatic Heart: regular rate & rhythm.  No murmurs/rubs/gallops Lungs: clear to auscultation bilaterally, normal respiratory effort Abdomen: soft, gravid, non-tender;  EFW: 7.5 Pelvic:   External: Normal external female genitalia  Cervix: Dilation: 4 / Effacement (%): 80 / Station: -2    Extremities: non-tender, symmetric, mild edema bilaterally.  DTRs: 2+ Neurologic: Alert & oriented x 3.      Pertinent Results:  Prenatal Labs: Blood type/Rh O+  Antibody screen neg  Rubella Immune  Varicella Immune  RPR NR  HBsAg Neg  HIV NR  GC neg  Chlamydia neg  Genetic screening negative  1 hour GTT 97  3 hour GTT   GBS Negative 09/20/2016   FHT:130 mod +  accels no decels TOCO: q2-72min SVE:  Dilation: 4 / Effacement (%): 80 / Station: -2    Cephalic by leopolds   Assessment:  Willye Javier Katalin Colledge is a 26 y.o. G62P1011 female at [redacted]w[redacted]d with Labor   Plan:  1. Admit to Labor & Delivery 2. CBC, T&S, Clrs, IVF 3. GBS  Neg - no ppx indicated 4. Consents obtained. 5. Continuous efm/toco 6. Category 1 tracing 7. Expectant management 8. Epidural if desired  ----- Ranae Plumber, MD Attending Obstetrician and  Gynecologist [facility], Department of OB/GYN [facility]

## 2016-10-17 NOTE — Progress Notes (Signed)
Intrapartum progress note:  S:  Comfortable   O: BP 116/68 (BP Location: Right Arm)   Pulse 69   Temp 99 F (37.2 C) (Oral)   Resp 16   Ht 5' (1.524 m)   Wt 70.8 kg (156 lb)   LMP 01/06/2016 (Approximate)   BMI 30.47 kg/m   SVE: 4.5/80/-2  AROM for clear fluid Toco: q39min FHT: 140 mod + accels no decels  A/P: 25yo G3P1011 @ 40+5 with labor  1. IUP: category 1 2. Labor: augment with pitocin, s/p AROM.   3. Continue active management, anticipated SVD  ----- Ranae Plumber, MD Attending Obstetrician and Gynecologist [facility], Department of OB/GYN [facility]

## 2016-10-17 NOTE — Discharge Summary (Signed)
Obstetrical Discharge Summary  Patient Name: Jeanne [name] DOB: 1990-05-04 MRN: [id number]  Date of Admission: 10/17/2016 Date of Delivery: 10/17/16 Delivered by: Ranae Plumber, MD Date of Discharge: 10/18/2016  Primary OB:  [facility]  EXB:[id number]'M last menstrual period was 01/06/2016 (approximate). EDC Estimated Date of Delivery: 10/12/16 Gestational Age at Delivery: [redacted]w[redacted]d   Antepartum complications: 1. Anemia 2. History of preeclampsia 3. History of post partum hemorrhage  Admitting Diagnosis: labor Secondary Diagnosis: Patient Active Problem List   Diagnosis Date Noted  . Pregnancy 10/17/2016  . Labor and delivery indication for care or intervention 10/17/2016  . Hx of preeclampsia, prior pregnancy, currently pregnant 10/17/2016  . History of postpartum hemorrhage 10/17/2016    Augmentation: AROM Complications: None Intrapartum complications/course:  Mom presented to L&D with labor. AROM for clear fluid, Progressed to complete, second stage: <30 mins, with delivery of fetal head with restitution to ROA.   Anterior then posterior shoulders delivered without difficulty.  Baby placed on mom's chest, and attended to by peds, who requested cord to be cut.  Cord was then clamped and cut by FOB.  A segment of cord was cut for gas collection.  Cord blood was collected for ABO typing.  Placenta spontaneously delivered, intact.   IV pitocin given for hemorrhage prophylaxis, then IM methergine   Date of Delivery: 10/17/16 Delivered By: Leeroy Bock Ward Delivery Type: spontaneous vaginal delivery Anesthesia: none Placenta: sponatneous Laceration: 1st degree Episiotomy: none Newborn Data: Live born female "IKER" Birth Weight: 8 lb 8.9 oz (3880 g) APGAR: ,     Postpartum Procedures: none  Post partum course:  Patient had an uncomplicated postpartum course.  By time of discharge on PPD#1, her pain was controlled on oral pain medications; she had appropriate lochia and was  ambulating, voiding without difficulty and tolerating regular diet.  She was deemed stable for discharge to home.     Discharge Physical Exam:  BP 109/62 (BP Location: Left Arm)   Pulse 60   Temp 98.5 F (36.9 C) (Oral)   Resp 18   Ht 5' (1.524 m)   Wt 156 lb (70.8 kg)   LMP 01/06/2016 (Approximate)   SpO2 98%   Breastfeeding? Unknown   BMI 30.47 kg/m   General: NAD CV: RRR Pulm: CTABL, nl effort ABD: s/nd/nt, fundus firm and below the umbilicus Lochia: moderate DVT Evaluation: LE non-ttp, no evidence of DVT on exam.  Hemoglobin  Date Value Ref Range Status  10/18/2016 12.1 12.0 - 16.0 g/dL Final   HGB  Date Value Ref Range Status  01/08/2013 7.4 (L) 12.0 - 16.0 g/dL Final   HCT  Date Value Ref Range Status  10/18/2016 36.1 35.0 - 47.0 % Final  01/08/2013 21.9 (L) 35.0 - 47.0 % Final     Disposition: stable, discharge to home. Baby Feeding: breastmilk  Baby Disposition: home with mom  Rh Immune globulin given: n/a Rubella vaccine given: n/a Tdap vaccine given in AP or PP setting: AP Flu vaccine given in AP or PP setting: n/a  Contraception: OCPs  Prenatal Labs:   Blood type/Rh O+  Antibody screen neg  Rubella Immune  Varicella Immune  RPR NR  HBsAg Neg  HIV NR  GC neg  Chlamydia neg  Genetic screening negative  1 hour GTT 97  3 hour GTT   GBS Negative 09/20/2016     Plan:  Jeanne [name] was discharged to home in good condition. Follow-up appointment with delivering provider in 6 weeks.  Discharge Medications:  Allergies as of 10/18/2016   No Known Allergies     Medication List    TAKE these medications   ferrous sulfate 325 (65 FE) MG EC tablet Take 325 mg by mouth 3 (three) times daily with meals.   ibuprofen 800 MG tablet Commonly known as:  ADVIL,MOTRIN Take 1 tablet (800 mg total) by mouth every 8 (eight) hours as needed for moderate pain or cramping.   multivitamin-prenatal 27-0.8 MG Tabs tablet Take 1 tablet by  mouth daily at 12 noon.   norethindrone-ethinyl estradiol 1-20 MG-MCG tablet Commonly known as:  JUNEL FE,GILDESS FE,LOESTRIN FE Take 1 tablet by mouth daily.   ondansetron 4 MG disintegrating tablet Commonly known as:  ZOFRAN ODT Take 1 tablet (4 mg total) by mouth every 6 (six) hours as needed for nausea.   oxyCODONE-acetaminophen 5-325 MG tablet Commonly known as:  PERCOCET/ROXICET Take 1-2 tablets by mouth every 6 (six) hours as needed for moderate pain (You may take with ibuprofen).            Discharge Care Instructions        Start     Ordered   10/17/16 0000  OB RESULT CONSOLE Group B Strep    Comments:  This external order was created through the Results Console.   10/17/16 0911   10/17/16 0000  OB RESULTS CONSOLE RPR    Comments:  This external order was created through the Results Console.    10/17/16 0911   10/17/16 0000  OB RESULTS CONSOLE RPR    Comments:  This external order was created through the Results Console.    10/17/16 0911   10/17/16 0000  OB RESULTS CONSOLE HIV antibody    Comments:  This external order was created through the Results Console.    10/17/16 0911   10/17/16 0000  OB RESULTS CONSOLE Rubella Antibody    Comments:  This external order was created through the Results Console.    10/17/16 0911   10/17/16 0000  OB RESULTS CONSOLE Varicella zoster antibody, IgG    Comments:  This external order was created through the Results Console.    10/17/16 0911   10/17/16 0000  OB RESULTS CONSOLE Hepatitis B surface antigen    Comments:  This external order was created through the Results Console.    10/17/16 0911      Follow-up Information    Ward, Elenora Fender, MD Follow up in 6 week(s).   Specialty:  Obstetrics and Gynecology Contact information: [address] [facility] [place] [address] [phone]           Signed: [name] 10/18/16

## 2016-10-17 NOTE — Discharge Instructions (Signed)

## 2016-10-17 NOTE — Progress Notes (Signed)
Dr Elesa Massed notified of pt arrival to unit. MD notified of pt complaint of contractions. MD notified of FHR tracing, CTX pattern and SVE per RN. Observation orders received and to recheck pt in 1-2 hours.

## 2016-10-18 LAB — CBC
HEMATOCRIT: 36.1 % (ref 35.0–47.0)
Hemoglobin: 12.1 g/dL (ref 12.0–16.0)
MCH: 29.8 pg (ref 26.0–34.0)
MCHC: 33.5 g/dL (ref 32.0–36.0)
MCV: 88.9 fL (ref 80.0–100.0)
Platelets: 119 10*3/uL — ABNORMAL LOW (ref 150–440)
RBC: 4.05 MIL/uL (ref 3.80–5.20)
RDW: 14.7 % — AB (ref 11.5–14.5)
WBC: 13.5 10*3/uL — AB (ref 3.6–11.0)

## 2016-10-18 LAB — RPR: RPR Ser Ql: NONREACTIVE

## 2016-10-18 NOTE — Progress Notes (Signed)
Post Partum Day 1 Subjective: no complaints, up ad lib, voiding and tolerating PO  Objective: Blood pressure (!) 97/58, pulse 60, temperature 97.6 F (36.4 C), temperature source Oral, resp. rate 18, height 5' (1.524 m), weight 156 lb (70.8 kg), last menstrual period 01/06/2016, SpO2 98 %, unknown if currently breastfeeding.  Physical Exam:  General: alert and cooperative Lochia: appropriate Uterine Fundus: firm DVT Evaluation: No evidence of DVT seen on physical exam. Abd: Supraumbilical appears to be a hernia, not tender Skin: Healing rash on abdomen, may have been PUPPs   Recent Labs  10/17/16 1955 10/18/16 0512  HGB 13.6 12.1  HCT 40.7 36.1    Assessment/Plan: Plan for discharge tomorrow and Breastfeeding   LOS: 1 day   [name] 10/18/2016, 10:34 AM

## 2016-10-18 NOTE — Progress Notes (Signed)
Discharged home at this time. Discharge instructions reviewed with patient by previous nurse, patient denies any further questions. NAD noted. Transported to car via wheelchair by tech.

## 2017-07-28 ENCOUNTER — Encounter: Payer: Self-pay | Admitting: Emergency Medicine

## 2017-07-28 ENCOUNTER — Inpatient Hospital Stay
Admission: EM | Admit: 2017-07-28 | Discharge: 2017-07-29 | DRG: 896 | Disposition: A | Discharge status: Home / Self Care | Payer: BLUE CROSS/BLUE SHIELD | Attending: Internal Medicine | Admitting: Internal Medicine

## 2017-07-28 ENCOUNTER — Emergency Department: Payer: BLUE CROSS/BLUE SHIELD

## 2017-07-28 DIAGNOSIS — G312 Degeneration of nervous system due to alcohol: Secondary | ICD-10-CM | POA: Diagnosis present

## 2017-07-28 DIAGNOSIS — R509 Fever, unspecified: Secondary | ICD-10-CM | POA: Diagnosis present

## 2017-07-28 DIAGNOSIS — Y908 Blood alcohol level of 240 mg/100 ml or more: Secondary | ICD-10-CM | POA: Diagnosis present

## 2017-07-28 DIAGNOSIS — R05 Cough: Secondary | ICD-10-CM | POA: Diagnosis present

## 2017-07-28 DIAGNOSIS — F10129 Alcohol abuse with intoxication, unspecified: Principal | ICD-10-CM | POA: Diagnosis present

## 2017-07-28 DIAGNOSIS — T68XXXA Hypothermia, initial encounter: Secondary | ICD-10-CM | POA: Diagnosis present

## 2017-07-28 DIAGNOSIS — E876 Hypokalemia: Secondary | ICD-10-CM | POA: Diagnosis present

## 2017-07-28 DIAGNOSIS — R4189 Other symptoms and signs involving cognitive functions and awareness: Secondary | ICD-10-CM

## 2017-07-28 DIAGNOSIS — F10929 Alcohol use, unspecified with intoxication, unspecified: Secondary | ICD-10-CM

## 2017-07-28 DIAGNOSIS — G9340 Encephalopathy, unspecified: Secondary | ICD-10-CM | POA: Diagnosis not present

## 2017-07-28 DIAGNOSIS — J9601 Acute respiratory failure with hypoxia: Secondary | ICD-10-CM | POA: Diagnosis present

## 2017-07-28 DIAGNOSIS — J9691 Respiratory failure, unspecified with hypoxia: Secondary | ICD-10-CM | POA: Diagnosis present

## 2017-07-28 LAB — BLOOD GAS, ARTERIAL
Acid-base deficit: 2.8 mmol/L — ABNORMAL HIGH (ref 0.0–2.0)
Bicarbonate: 19.7 mmol/L — ABNORMAL LOW (ref 20.0–28.0)
FIO2: 0.3
MECHVT: 450 mL
Mechanical Rate: 12
O2 SAT: 99.5 %
PCO2 ART: 27 mmHg — AB (ref 32.0–48.0)
PO2 ART: 159 mmHg — AB (ref 83.0–108.0)
Patient temperature: 37
pH, Arterial: 7.47 — ABNORMAL HIGH (ref 7.350–7.450)

## 2017-07-28 LAB — CBC
HCT: 37.4 % (ref 35.0–47.0)
Hemoglobin: 12.8 g/dL (ref 12.0–16.0)
MCH: 30.6 pg (ref 26.0–34.0)
MCHC: 34.3 g/dL (ref 32.0–36.0)
MCV: 89.3 fL (ref 80.0–100.0)
PLATELETS: 242 10*3/uL (ref 150–440)
RBC: 4.19 MIL/uL (ref 3.80–5.20)
RDW: 12.8 % (ref 11.5–14.5)
WBC: 13 10*3/uL — AB (ref 3.6–11.0)

## 2017-07-28 LAB — URINE DRUG SCREEN, QUALITATIVE (ARMC ONLY)
Amphetamines, Ur Screen: NOT DETECTED
BENZODIAZEPINE, UR SCRN: NOT DETECTED
Barbiturates, Ur Screen: NOT DETECTED
Cannabinoid 50 Ng, Ur ~~LOC~~: NOT DETECTED
Cocaine Metabolite,Ur ~~LOC~~: NOT DETECTED
MDMA (ECSTASY) UR SCREEN: NOT DETECTED
Methadone Scn, Ur: NOT DETECTED
Opiate, Ur Screen: NOT DETECTED
PHENCYCLIDINE (PCP) UR S: NOT DETECTED
TRICYCLIC, UR SCREEN: NOT DETECTED

## 2017-07-28 LAB — COMPREHENSIVE METABOLIC PANEL
ALT: 74 U/L — AB (ref 14–54)
AST: 47 U/L — AB (ref 15–41)
Albumin: 4.1 g/dL (ref 3.5–5.0)
Alkaline Phosphatase: 82 U/L (ref 38–126)
Anion gap: 12 (ref 5–15)
BUN: 11 mg/dL (ref 6–20)
CHLORIDE: 103 mmol/L (ref 101–111)
CO2: 20 mmol/L — AB (ref 22–32)
CREATININE: 0.55 mg/dL (ref 0.44–1.00)
Calcium: 8 mg/dL — ABNORMAL LOW (ref 8.9–10.3)
GFR calc Af Amer: >60 mL/min (ref >60)
Glucose, Bld: 119 mg/dL — ABNORMAL HIGH (ref 65–99)
Potassium: 2.8 mmol/L — ABNORMAL LOW (ref 3.5–5.1)
Sodium: 135 mmol/L (ref 135–145)
Total Bilirubin: 0.4 mg/dL (ref 0.3–1.2)
Total Protein: 7.3 g/dL (ref 6.5–8.1)

## 2017-07-28 LAB — TRIGLYCERIDES: Triglycerides: 293 mg/dL — ABNORMAL HIGH (ref <150)

## 2017-07-28 LAB — BASIC METABOLIC PANEL
Anion gap: 13 (ref 5–15)
BUN: 6 mg/dL (ref 6–20)
CALCIUM: 7.8 mg/dL — AB (ref 8.9–10.3)
CO2: 18 mmol/L — AB (ref 22–32)
CREATININE: 0.44 mg/dL (ref 0.44–1.00)
Chloride: 108 mmol/L (ref 101–111)
GFR calc Af Amer: >60 mL/min (ref >60)
GFR calc non Af Amer: >60 mL/min (ref >60)
GLUCOSE: 105 mg/dL — AB (ref 65–99)
Potassium: 3.7 mmol/L (ref 3.5–5.1)
Sodium: 139 mmol/L (ref 135–145)

## 2017-07-28 LAB — GLUCOSE, CAPILLARY
Glucose-Capillary: 102 mg/dL — ABNORMAL HIGH (ref 65–99)
Glucose-Capillary: 91 mg/dL (ref 65–99)

## 2017-07-28 LAB — TROPONIN I

## 2017-07-28 LAB — MRSA PCR SCREENING: MRSA BY PCR: NEGATIVE

## 2017-07-28 LAB — TSH: TSH: 5.012 u[IU]/mL — ABNORMAL HIGH (ref 0.350–4.500)

## 2017-07-28 LAB — ETHANOL: Alcohol, Ethyl (B): 424 mg/dL (ref <10)

## 2017-07-28 LAB — HEMOGLOBIN A1C
Hgb A1c MFr Bld: 5.2 % (ref 4.8–5.6)
Mean Plasma Glucose: 102.54 mg/dL

## 2017-07-28 LAB — MAGNESIUM: Magnesium: 1.9 mg/dL (ref 1.7–2.4)

## 2017-07-28 IMAGING — DX DG CHEST 1V PORT
1 series · 1 of 1 positions shown · non-contrast
Comparison: None.

CLINICAL DATA: Endotracheal tube and orogastric tube placement.

EXAM:
PORTABLE CHEST 1 VIEW

[chest ap]
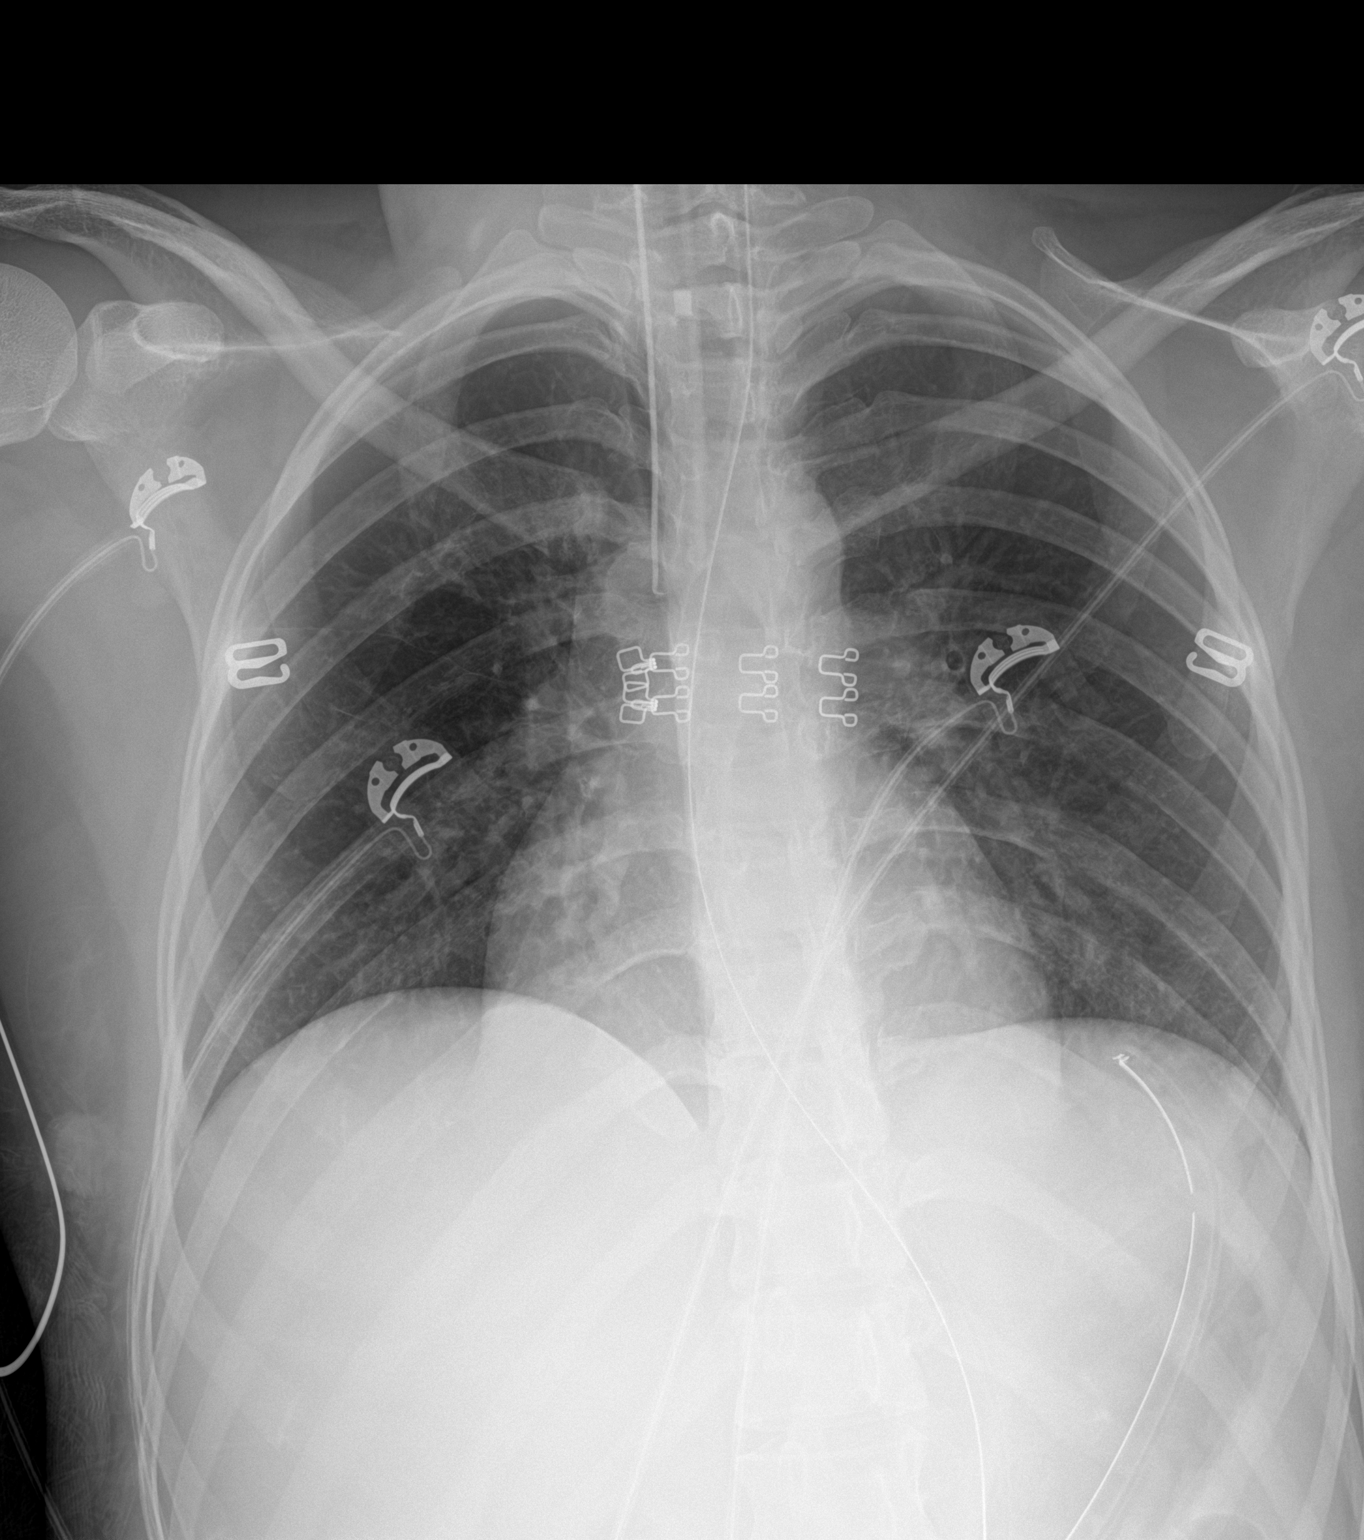

[1 of 1 positions shown; findings below may reference images not displayed]

FINDINGS: The patient's endotracheal tube is seen ending just above the
carina. This could be retracted 2-3 cm. An enteric tube is noted
ending overlying the stomach.

The lungs are well-aerated and clear. There is no evidence of focal
opacification, pleural effusion or pneumothorax.

The cardiomediastinal silhouette is within normal limits. No acute
osseous abnormalities are seen.
IMPRESSION: 1. Endotracheal tube seen ending just above the carina. This could
be retracted 2-3 cm, as deemed clinically appropriate.
2. Enteric tube noted ending overlying the stomach.
3. No acute cardiopulmonary process seen.

## 2017-07-28 MED ORDER — ACETAMINOPHEN 650 MG RE SUPP: 650.0000 mg | Freq: Four times a day (QID) | RECTAL | Status: DC | PRN

## 2017-07-28 MED ORDER — FENTANYL CITRATE (PF) 100 MCG/2ML IJ SOLN
100.0000 ug | INTRAMUSCULAR | Status: DC | PRN
  Administered 2017-07-28: 100 ug via INTRAVENOUS
  Filled 2017-07-28: qty 2

## 2017-07-28 MED ORDER — ONDANSETRON HCL 4 MG PO TABS: 4.0000 mg | ORAL_TABLET | Freq: Four times a day (QID) | ORAL | Status: DC | PRN

## 2017-07-28 MED ORDER — SODIUM CHLORIDE 0.9 % IV SOLN
INTRAVENOUS | Status: DC
  Administered 2017-07-28 (×3): via INTRAVENOUS

## 2017-07-28 MED ORDER — POTASSIUM CHLORIDE 10 MEQ/100ML IV SOLN
10.0000 meq | INTRAVENOUS | Status: AC
  Administered 2017-07-28 (×4): 10 meq via INTRAVENOUS
  Filled 2017-07-28 (×4): qty 100

## 2017-07-28 MED ORDER — FENTANYL CITRATE (PF) 100 MCG/2ML IJ SOLN: 100.0000 ug | INTRAMUSCULAR | Status: DC | PRN

## 2017-07-28 MED ORDER — MIDAZOLAM HCL 2 MG/2ML IJ SOLN: 1.0000 mg | Freq: Once | INTRAMUSCULAR | Status: DC

## 2017-07-28 MED ORDER — CHLORHEXIDINE GLUCONATE 0.12% ORAL RINSE (MEDLINE KIT)
15.0000 mL | Freq: Two times a day (BID) | OROMUCOSAL | Status: DC
  Administered 2017-07-28: 15 mL via OROMUCOSAL

## 2017-07-28 MED ORDER — PROPOFOL 1000 MG/100ML IV EMUL
0.0000 ug/kg/min | INTRAVENOUS | Status: DC
  Administered 2017-07-28: 20 ug/kg/min via INTRAVENOUS
  Filled 2017-07-28: qty 100

## 2017-07-28 MED ORDER — FENTANYL CITRATE (PF) 100 MCG/2ML IJ SOLN
INTRAMUSCULAR | Status: AC
  Administered 2017-07-28: 12.5 ug via INTRAVENOUS
  Filled 2017-07-28: qty 2

## 2017-07-28 MED ORDER — ENOXAPARIN SODIUM 40 MG/0.4ML ~~LOC~~ SOLN
40.0000 mg | SUBCUTANEOUS | Status: DC
  Administered 2017-07-28: 40 mg via SUBCUTANEOUS
  Filled 2017-07-28: qty 0.4

## 2017-07-28 MED ORDER — ETOMIDATE 2 MG/ML IV SOLN
INTRAVENOUS | Status: AC | PRN
  Administered 2017-07-28: 12 mg via INTRAVENOUS

## 2017-07-28 MED ORDER — FAMOTIDINE IN NACL 20-0.9 MG/50ML-% IV SOLN
20.0000 mg | INTRAVENOUS | Status: DC
  Administered 2017-07-29: 20 mg via INTRAVENOUS
  Filled 2017-07-28: qty 50

## 2017-07-28 MED ORDER — ACETAMINOPHEN 325 MG PO TABS
650.0000 mg | ORAL_TABLET | Freq: Four times a day (QID) | ORAL | Status: DC | PRN
  Administered 2017-07-28: 650 mg via ORAL
  Filled 2017-07-28: qty 2

## 2017-07-28 MED ORDER — FENTANYL CITRATE (PF) 100 MCG/2ML IJ SOLN
12.5000 ug | Freq: Once | INTRAMUSCULAR | Status: AC
  Administered 2017-07-28: 12.5 ug via INTRAVENOUS

## 2017-07-28 MED ORDER — MIDAZOLAM HCL 5 MG/5ML IJ SOLN
INTRAMUSCULAR | Status: AC
  Administered 2017-07-28: 1 mg via INTRAVENOUS
  Filled 2017-07-28: qty 5

## 2017-07-28 MED ORDER — MIDAZOLAM HCL 5 MG/5ML IJ SOLN
1.0000 mg | Freq: Once | INTRAMUSCULAR | Status: AC
  Administered 2017-07-28: 1 mg via INTRAVENOUS

## 2017-07-28 MED ORDER — SODIUM CHLORIDE 0.9 % IV SOLN
Freq: Once | INTRAVENOUS | Status: AC
  Administered 2017-07-28: 03:00:00 via INTRAVENOUS

## 2017-07-28 MED ORDER — SODIUM CHLORIDE 0.9 % IV SOLN
INTRAVENOUS | Status: AC | PRN
  Administered 2017-07-28: 1000 mL via INTRAVENOUS

## 2017-07-28 MED ORDER — SODIUM CHLORIDE 0.9 % IV SOLN
3.0000 g | Freq: Four times a day (QID) | INTRAVENOUS | Status: DC
  Administered 2017-07-28 – 2017-07-29 (×5): 3 g via INTRAVENOUS
  Filled 2017-07-28 (×8): qty 3

## 2017-07-28 MED ORDER — ORAL CARE MOUTH RINSE
15.0000 mL | OROMUCOSAL | Status: DC
  Administered 2017-07-28: 15 mL via OROMUCOSAL

## 2017-07-28 MED ORDER — DOCUSATE SODIUM 100 MG PO CAPS
100.0000 mg | ORAL_CAPSULE | Freq: Two times a day (BID) | ORAL | Status: DC
  Administered 2017-07-28: 100 mg via ORAL
  Filled 2017-07-28: qty 1

## 2017-07-28 MED ORDER — ROCURONIUM BROMIDE 50 MG/5ML IV SOLN
INTRAVENOUS | Status: AC | PRN
  Administered 2017-07-28: 70 mg via INTRAVENOUS

## 2017-07-28 MED ORDER — ONDANSETRON HCL 4 MG/2ML IJ SOLN
4.0000 mg | Freq: Four times a day (QID) | INTRAMUSCULAR | Status: DC | PRN
  Administered 2017-07-28: 4 mg via INTRAVENOUS
  Filled 2017-07-28: qty 2

## 2017-07-28 NOTE — Progress Notes (Signed)
Pt extubated without complications, no stridor noted, respiratory rate 20/min, sats 100% on 2lpm [place], will continue to monitor.

## 2017-07-28 NOTE — ED Notes (Signed)
Bedside reports given to [facility] RN in [facility]U.

## 2017-07-28 NOTE — ED Provider Notes (Addendum)
[facility] Emergency Department Provider Note   ____________________________________________   First MD Initiated Contact with Patient 07/28/17 0215     (approximate)  I have reviewed the triage vital signs and the nursing notes.   HISTORY  Chief Complaint Unresponsive  The patient is unresponsive and unable to participate in the history.  HPI [name] is a 27 y.o. female who comes into the hospital today unresponsive.  EMS states that they were called for the patient being passed out.  They report that the patient's family has been drinking but the family was insistent that the patient had not drank anything today.  They state that she has not been drinking.  Per EMS the patient report was that she passed out.  They attempted Narcan with no response and they tried an ammonia ampule with no response.  She is here today for evaluation.   History reviewed. No pertinent past medical history.  Patient Active Problem List   Diagnosis Date Noted  . Respiratory failure with hypoxia (HCC) 07/28/2017  . Pregnancy 10/17/2016  . Labor and delivery indication for care or intervention 10/17/2016  . Hx of preeclampsia, prior pregnancy, currently pregnant 10/17/2016  . History of postpartum hemorrhage 10/17/2016    Past Surgical History:  Procedure Laterality Date  . DILATION AND EVACUATION N/A 02/09/2015   Procedure: DILATATION AND EVACUATION;  Surgeon: [name], MD;  Location: [facility] ORS;  Service: Gynecology;  Laterality: N/A;  . LAPAROSCOPY  02/09/2015   Procedure: LAPAROSCOPY DIAGNOSTIC;  Surgeon: [name], MD;  Location: [facility] ORS;  Service: Gynecology;;    Prior to Admission medications   Medication Sig Start Date End Date Taking? Authorizing Provider  ferrous sulfate 325 (65 FE) MG EC tablet Take 325 mg by mouth 3 (three) times daily with meals.    [provider]  ibuprofen (ADVIL,MOTRIN) 800 MG tablet Take 1 tablet  (800 mg total) by mouth every 8 (eight) hours as needed for moderate pain or cramping. Patient not taking: Reported on 10/17/2016 02/09/15   [name], MD  norethindrone-ethinyl estradiol (JUNEL FE,GILDESS FE,LOESTRIN FE) 1-20 MG-MCG tablet Take 1 tablet by mouth daily.    [provider]  ondansetron (ZOFRAN ODT) 4 MG disintegrating tablet Take 1 tablet (4 mg total) by mouth every 6 (six) hours as needed for nausea. Patient not taking: Reported on 10/17/2016 02/09/15   [name], MD  oxyCODONE-acetaminophen (PERCOCET/ROXICET) 5-325 MG tablet Take 1-2 tablets by mouth every 6 (six) hours as needed for moderate pain (You may take with ibuprofen). Patient not taking: Reported on 10/17/2016 02/09/15   [name], MD  Prenatal Vit-Fe Fumarate-FA (MULTIVITAMIN-PRENATAL) 27-0.8 MG TABS tablet Take 1 tablet by mouth daily at 12 noon.    [provider]    Allergies Patient has no known allergies.  No family history on file.  Social History Social History   Tobacco Use  . Smoking status: Never Smoker  . Smokeless tobacco: Never Used  Substance Use Topics  . Alcohol use: Yes  . Drug use: No    Review of Systems  Unable to assess review of systems due to patient unresponsiveness  ____________________________________________   PHYSICAL EXAM:  VITAL SIGNS: ED Triage Vitals  Enc Vitals Group     BP 07/28/17 0250 104/65     Pulse Rate 07/28/17 0250 73     Resp 07/28/17 0250 18     Temp 07/28/17 0250 (!) 95 F (35 C)     Temp  Source 07/28/17 0250 Core     SpO2 07/28/17 0250 100 %     Weight 07/28/17 0252 120 lb (54.4 kg)     Height --      Head Circumference --      Peak Flow --      Pain Score --      Pain Loc --      Pain Edu? --      Excl. in GC? --     Constitutional: The patient is unresponsive.  She does not move to sternal rub nor does she do any activities. Eyes: Conjunctivae are normal. PERRL. Head: Atraumatic. Nose: No  congestion/rhinnorhea. Mouth/Throat: Mucous membranes are moist.  Oropharynx non-erythematous. Cardiovascular: Normal rate, regular rhythm. Grossly normal heart sounds.  Good peripheral circulation. Respiratory: Sonorous respirations with shallow breathing Gastrointestinal: Soft with no distention.  Positive bowel sounds Musculoskeletal: No lower extremity tenderness nor edema.   Neurologic: Patient is nonverbal GCS is approximately 7.  The patient gets a 1 for eye movement, 2 for verbal as she did moan once and a 4 4 movement as she did move her arm but it was neither decorticate or decerebrate. Skin:  Skin is warm, dry and intact.  Psychiatric: Patient unresponsive  ____________________________________________   LABS (all labs ordered are listed, but only abnormal results are displayed)  Labs Reviewed  CBC - Abnormal; Notable for the following components:      Result Value   WBC 13.0 (*)    All other components within normal limits  COMPREHENSIVE METABOLIC PANEL - Abnormal; Notable for the following components:   Potassium 2.8 (*)    CO2 20 (*)    Glucose, Bld 119 (*)    Calcium 8.0 (*)    AST 47 (*)    ALT 74 (*)    All other components within normal limits  ETHANOL - Abnormal; Notable for the following components:   Alcohol, Ethyl (B) 424 (*)    All other components within normal limits  BLOOD GAS, ARTERIAL - Abnormal; Notable for the following components:   pH, Arterial 7.47 (*)    pCO2 arterial 27 (*)    pO2, Arterial 159 (*)    Bicarbonate 19.7 (*)    Acid-base deficit 2.8 (*)    All other components within normal limits  GLUCOSE, CAPILLARY - Abnormal; Notable for the following components:   Glucose-Capillary 102 (*)    All other components within normal limits  TROPONIN I  URINE DRUG SCREEN, QUALITATIVE ([facility] ONLY)  TRIGLYCERIDES   ____________________________________________  EKG  ED ECG REPORT I, [name], the attending physician, personally  viewed and interpreted this ECG.   Date: 07/28/2017  EKG Time: 326  Rate: 68  Rhythm: normal sinus rhythm  Axis: normal  Intervals:none  ST&T Change: none  ____________________________________________  RADIOLOGY  ED MD interpretation: Chest x-ray: No acute cardiopulmonary process seen, endotracheal tube ending just above the carina .  Official radiology report(s): Dg Chest Portable 1 View  Result Date: 07/28/2017 CLINICAL DATA:  Endotracheal tube and orogastric tube placement. EXAM: PORTABLE CHEST 1 VIEW COMPARISON:  None. FINDINGS: The patient's endotracheal tube is seen ending just above the carina. This could be retracted 2-3 cm. An enteric tube is noted ending overlying the stomach. The lungs are well-aerated and clear. There is no evidence of focal opacification, pleural effusion or pneumothorax. The cardiomediastinal silhouette is within normal limits. No acute osseous abnormalities are seen. IMPRESSION: 1. Endotracheal tube seen ending just above the  carina. This could be retracted 2-3 cm, as deemed clinically appropriate. 2. Enteric tube noted ending overlying the stomach. 3. No acute cardiopulmonary process seen. Electronically Signed   By: Roanna RaiderJeffery  Chang M.D.   On: 07/28/2017 03:45    ____________________________________________   PROCEDURES  Procedure(s) performed: please, see procedure note(s).  Procedure Name: Intubation Date/Time: 07/28/2017 2:24 AM Performed by: Rebecka ApleyWebster, Shaletha Humble P, MD Pre-anesthesia Checklist: Patient identified, Patient being monitored, Emergency Drugs available, Timeout performed and Suction available Oxygen Delivery Method: Ambu bag Preoxygenation: Pre-oxygenation with 100% oxygen Induction Type: Rapid sequence Ventilation: Mask ventilation without difficulty Laryngoscope Size: Glidescope and 4 Tube size: 7.5 mm Number of attempts: 1 Placement Confirmation: ETT inserted through vocal cords under direct vision,  Breath sounds checked- equal and  bilateral and Positive ETCO2 Secured at: 22 cm Tube secured with: ETT holder Dental Injury: Injury to lip  Difficulty Due To: Difficulty was unanticipated    .Critical Care Performed by: Rebecka ApleyWebster, Solomiya Pascale P, MD Authorized by: Rebecka ApleyWebster, Encarnacion Scioneaux P, MD   Critical care provider statement:    Critical care time (minutes):  30   Critical care start time:  07/28/2017 2:15 AM   Critical care end time:  07/28/2017 3:28 AM   Critical care time was exclusive of:  Separately billable procedures and treating other patients   Critical care was necessary to treat or prevent imminent or life-threatening deterioration of the following conditions:  Respiratory failure   Critical care was time spent personally by me on the following activities:  Development of treatment plan with patient or surrogate, evaluation of patient's response to treatment, examination of patient, obtaining history from patient or surrogate, ordering and performing treatments and interventions, ordering and review of laboratory studies, ordering and review of radiographic studies, pulse oximetry, re-evaluation of patient's condition and review of old charts   I assumed direction of critical care for this patient from another provider in my specialty: no      Critical Care performed: Yes, see critical care note(s)  ____________________________________________   INITIAL IMPRESSION / ASSESSMENT AND PLAN / ED COURSE  As part of my medical decision making, I reviewed the following data within the electronic MEDICAL RECORD NUMBER Notes from prior ED visits and [place] Controlled Substance Database   This is a 27 year old female who comes into the hospital today after passing out with her family.  The patient's family was adamant that she had not been drinking.  When the patient arrived to the emergency department she was unresponsive with a GCS that was somewhere between a 3 and a 7.  The patient did moan and might of move her right arm but it was  not consistent and she was not responding to sternal rub or gag.  As the patient is unable to protect her airway she was intubated on arrival to the ED.  My differential diagnosis includes alcohol intoxication, substance abuse, acute coronary syndrome, electrolyte abnormality.  We did check some blood work on the patient and it was found that the patient's ethanol level was 424.  The patient's troponin is negative and the patient's potassium is 2.8.  I will give the patient some potassium and she did receive a liter of normal saline.  I feel that the patient's symptoms are due to alcohol intoxication.  She will be admitted to the hospitalist service for alcohol intoxication and inability to protect her airway.  We will also place a bair hugger on the patient to improve her temperature  ____________________________________________   FINAL CLINICAL IMPRESSION(S) / ED DIAGNOSES  Final diagnoses:  Alcoholic intoxication with complication (HCC)  Unresponsive  Hypothermia, initial encounter     ED Discharge Orders    None       Note:  This document was prepared using Dragon voice recognition software and may include unintentional dictation errors.    [name], MD 07/28/17 0356    [name], MD 07/28/17 [phone]

## 2017-07-28 NOTE — H&P (Signed)
Kaytelyn Jerilynn Mages Briannon Boggio is an 27 y.o. female.   Chief Complaint: Unresponsiveness HPI: The patient with no chronic medical problems presents to the emergency department via EMS after being found unresponsive at home.  Family reports that there were many individuals drinking at a party but they were unaware that the patient had any alcohol.  Laboratory evaluation revealed otherwise with a blood alcohol level of greater than 400.  The patient was not protecting her airway so was intubated prior to the emergency department staff calling the hospitalist service for admission.  History reviewed. No pertinent past medical history.  Past Surgical History:  Procedure Laterality Date  . DILATION AND EVACUATION N/A 02/09/2015   Procedure: DILATATION AND EVACUATION;  Surgeon: Benjaman Kindler, MD;  Location: [facility] ORS;  Service: Gynecology;  Laterality: N/A;  . LAPAROSCOPY  02/09/2015   Procedure: LAPAROSCOPY DIAGNOSTIC;  Surgeon: Benjaman Kindler, MD;  Location: [facility] ORS;  Service: Gynecology;;    No family history on file.  No familial conditions  Social History:  reports that she has never smoked. She has never used smokeless tobacco. She reports that she drinks alcohol. She reports that she does not use drugs.  Allergies: No Known Allergies  Medications Prior to Admission  Medication Sig Dispense Refill  . ferrous sulfate 325 (65 FE) MG EC tablet Take 325 mg by mouth 3 (three) times daily with meals.    Marland Kitchen ibuprofen (ADVIL,MOTRIN) 800 MG tablet Take 1 tablet (800 mg total) by mouth every 8 (eight) hours as needed for moderate pain or cramping. (Patient not taking: Reported on 10/17/2016) 30 tablet 1  . norethindrone-ethinyl estradiol (JUNEL FE,GILDESS FE,LOESTRIN FE) 1-20 MG-MCG tablet Take 1 tablet by mouth daily.    . ondansetron (ZOFRAN ODT) 4 MG disintegrating tablet Take 1 tablet (4 mg total) by mouth every 6 (six) hours as needed for nausea. (Patient not taking: Reported on 10/17/2016) 20 tablet 0   . oxyCODONE-acetaminophen (PERCOCET/ROXICET) 5-325 MG tablet Take 1-2 tablets by mouth every 6 (six) hours as needed for moderate pain (You may take with ibuprofen). (Patient not taking: Reported on 10/17/2016) 30 tablet 0  . Prenatal Vit-Fe Fumarate-FA (MULTIVITAMIN-PRENATAL) 27-0.8 MG TABS tablet Take 1 tablet by mouth daily at 12 noon.      Results for orders placed or performed during the hospital encounter of 07/28/17 (from the past 48 hour(s))  CBC     Status: Abnormal   Collection Time: 07/28/17  2:20 AM  Result Value Ref Range   WBC 13.0 (H) 3.6 - 11.0 K/uL   RBC 4.19 3.80 - 5.20 MIL/uL   Hemoglobin 12.8 12.0 - 16.0 g/dL   HCT 37.4 35.0 - 47.0 %   MCV 89.3 80.0 - 100.0 fL   MCH 30.6 26.0 - 34.0 pg   MCHC 34.3 32.0 - 36.0 g/dL   RDW 12.8 11.5 - 14.5 %   Platelets 242 150 - 440 K/uL    Comment: Performed at [facility], [address] [place],  [address]  Comprehensive metabolic panel     Status: Abnormal   Collection Time: 07/28/17  2:20 AM  Result Value Ref Range   Sodium 135 135 - 145 mmol/L   Potassium 2.8 (L) 3.5 - 5.1 mmol/L   Chloride 103 101 - 111 mmol/L   CO2 20 (L) 22 - 32 mmol/L   Glucose, Bld 119 (H) 65 - 99 mg/dL   BUN 11 6 - 20 mg/dL   Creatinine, Ser 0.55 0.44 - 1.00 mg/dL  Calcium 8.0 (L) 8.9 - 10.3 mg/dL   Total Protein 7.3 6.5 - 8.1 g/dL   Albumin 4.1 3.5 - 5.0 g/dL   AST 47 (H) 15 - 41 U/L   ALT 74 (H) 14 - 54 U/L   Alkaline Phosphatase 82 38 - 126 U/L   Total Bilirubin 0.4 0.3 - 1.2 mg/dL   GFR calc non Af Amer >60 >60 mL/min   GFR calc Af Amer >60 >60 mL/min    Comment: (NOTE) The eGFR has been calculated using the CKD EPI equation. This calculation has not been validated in all clinical situations. eGFR's persistently <60 mL/min signify possible Chronic Kidney Disease.    Anion gap 12 5 - 15    Comment: Performed at [facility], [address] [place], [place] [address]  Ethanol     Status: Abnormal    Collection Time: 07/28/17  2:20 AM  Result Value Ref Range   Alcohol, Ethyl (B) 424 (HH) <10 mg/dL    Comment: CRITICAL RESULT CALLED TO, READ BACK BY AND VERIFIED WITH IRIS RASLAWSKI AT 0300 07/28/17.PMH (NOTE) Lowest detectable limit for serum alcohol is 10 mg/dL. For medical purposes only. Performed at [facility], [address] [place], [place] [address]   Troponin I     Status: None   Collection Time: 07/28/17  2:20 AM  Result Value Ref Range   Troponin I <0.03 <0.03 ng/mL    Comment: Performed at [facility], [address] [place], [place] [address]  Urine Drug Screen, Qualitative [facility] only)     Status: None   Collection Time: 07/28/17  2:20 AM  Result Value Ref Range   Tricyclic, Ur Screen NONE DETECTED NONE DETECTED   Amphetamines, Ur Screen NONE DETECTED NONE DETECTED   MDMA (Ecstasy)Ur Screen NONE DETECTED NONE DETECTED   Cocaine Metabolite,Ur [place] NONE DETECTED NONE DETECTED   Opiate, Ur Screen NONE DETECTED NONE DETECTED   Phencyclidine (PCP) Ur S NONE DETECTED NONE DETECTED   Cannabinoid 50 Ng, Ur  NONE DETECTED NONE DETECTED   Barbiturates, Ur Screen NONE DETECTED NONE DETECTED   Benzodiazepine, Ur Scrn NONE DETECTED NONE DETECTED   Methadone Scn, Ur NONE DETECTED NONE DETECTED    Comment: (NOTE) Tricyclics + metabolites, urine    Cutoff 1000 ng/mL Amphetamines + metabolites, urine  Cutoff 1000 ng/mL MDMA (Ecstasy), urine              Cutoff 500 ng/mL Cocaine Metabolite, urine          Cutoff 300 ng/mL Opiate + metabolites, urine        Cutoff 300 ng/mL Phencyclidine (PCP), urine         Cutoff 25 ng/mL Cannabinoid, urine                 Cutoff 50 ng/mL Barbiturates + metabolites, urine  Cutoff 200 ng/mL Benzodiazepine, urine              Cutoff 200 ng/mL Methadone, urine                   Cutoff 300 ng/mL The urine drug screen provides only a preliminary, unconfirmed analytical test result and should not be used for  non-medical purposes. Clinical consideration and professional judgment should be applied to any positive drug screen result due to possible interfering substances. A more specific alternate chemical method must be used in order to obtain a confirmed analytical result. Gas chromatography / mass spectrometry (GC/MS) is the preferred  confirmat ory method. Performed at [facility], [address] [place], [place] [address]   TSH     Status: Abnormal   Collection Time: 07/28/17  2:20 AM  Result Value Ref Range   TSH 5.012 (H) 0.350 - 4.500 uIU/mL    Comment: Performed by a 3rd Generation assay with a functional sensitivity of <=0.01 uIU/mL. Performed at [facility], [address] [place], [place] [address]   Magnesium     Status: None   Collection Time: 07/28/17  2:20 AM  Result Value Ref Range   Magnesium 1.9 1.7 - 2.4 mg/dL    Comment: Performed at [facility], [address] [place], [place] [address]  Blood gas, arterial     Status: Abnormal   Collection Time: 07/28/17  2:44 AM  Result Value Ref Range   FIO2 0.30    Delivery systems VENTILATOR    Mode ASSIST CONTROL    VT 450 mL   pH, Arterial 7.47 (H) 7.350 - 7.450   pCO2 arterial 27 (L) 32.0 - 48.0 mmHg   pO2, Arterial 159 (H) 83.0 - 108.0 mmHg   Bicarbonate 19.7 (L) 20.0 - 28.0 mmol/L   Acid-base deficit 2.8 (H) 0.0 - 2.0 mmol/L   O2 Saturation 99.5 %   Patient temperature 37.0    Collection site RIGHT RADIAL    Sample type ARTERIAL DRAW    Allens test (pass/fail) PASS PASS   Mechanical Rate 12     Comment: Performed at [facility], [address] [place], [place] [address]  Glucose, capillary     Status: Abnormal   Collection Time: 07/28/17  4:27 AM  Result Value Ref Range   Glucose-Capillary 102 (H) 65 - 99 mg/dL   Dg Chest Portable 1 View  Result Date: 07/28/2017 CLINICAL DATA:  Endotracheal tube and orogastric tube placement. EXAM: PORTABLE CHEST 1 VIEW COMPARISON:   None. FINDINGS: The patient's endotracheal tube is seen ending just above the carina. This could be retracted 2-3 cm. An enteric tube is noted ending overlying the stomach. The lungs are well-aerated and clear. There is no evidence of focal opacification, pleural effusion or pneumothorax. The cardiomediastinal silhouette is within normal limits. No acute osseous abnormalities are seen. IMPRESSION: 1. Endotracheal tube seen ending just above the carina. This could be retracted 2-3 cm, as deemed clinically appropriate. 2. Enteric tube noted ending overlying the stomach. 3. No acute cardiopulmonary process seen. Electronically Signed   By: Garald Balding M.D.   On: 07/28/2017 03:45    Review of Systems  Unable to perform ROS: Critical illness    Blood pressure (!) 96/57, pulse 67, temperature (!) 96.2 F (35.7 C), resp. rate 18, weight 54.4 kg (120 lb), SpO2 100 %, unknown if currently breastfeeding. Physical Exam  Vitals reviewed. Constitutional: She appears well-developed and well-nourished. No distress.  HENT:  Head: Normocephalic and atraumatic.  Mouth/Throat: Oropharynx is clear and moist.  Eyes: Pupils are equal, round, and reactive to light. Conjunctivae and EOM are normal. No scleral icterus.  Neck: Normal range of motion. Neck supple. No JVD present. No tracheal deviation present. No thyromegaly present.  Cardiovascular: Normal rate, regular rhythm and normal heart sounds. Exam reveals no gallop and no friction rub.  No murmur heard. Respiratory: Effort normal. She has rales.  GI: Soft. Bowel sounds are normal. She exhibits no distension. There is no tenderness.  Genitourinary:  Genitourinary Comments: Deferred  Musculoskeletal: Normal range of motion. She exhibits no edema.  Lymphadenopathy:    She has no cervical adenopathy.  Neurological: No cranial nerve deficit. She exhibits normal muscle tone.  Intubated and sedated  Skin: Skin is warm and dry.  Psychiatric:  Initially  unresponsive.  Now patient is intubated and sedated.     Assessment/Plan This is a 27 year old female admitted for respiratory failure. 1.  Respiratory failure: With hypoxia; secondary to alcohol intoxication.  Patient is currently intubated.  She may have aspirated due to some faint rales in the left lower base.  Follow clinically.  Initially hemodynamically stable.  At the time of this writing pressure is decreased.  Consider initiation of sepsis protocol. 2.  Alcohol abuse: With intoxication; hydrate with intravenous fluid.  No indication of chronic alcohol abuse however the patient may need CIWA coverage with benzodiazepines prior to hospital discharge.  Next line 3.  DVT prophylaxis: Lovenox 4.  GI prophylaxis: None The patient is a full code.  Time spent on admission orders and critical care approximately 45 minutes discussed with E-link telemedicine.   Harrie Foreman, MD 07/28/2017, 5:15 AM

## 2017-07-28 NOTE — ED Notes (Signed)
Pt placed on bair hugger due to low core body temp.

## 2017-07-28 NOTE — ED Triage Notes (Signed)
[facility]  Little history given to EMS upon arrival.  Family states they were drinking and that patient was not.  Family states that patient passed out and became unresponsive for no apparent reason.  2mg  narcan given IV to patient with no response.

## 2017-07-28 NOTE — Consult Note (Signed)
Pharmacy Antibiotic Note  Jeanne [name] is a 27 y.o. female admitted on 07/28/2017 with aspiration pneumonia.  Pharmacy has been consulted for Unasyn dosing.  Plan: Start Unasyn 3g IV every 6 hours.   Weight: 120 lb (54.4 kg)  Temp (24hrs), Avg:95.6 F (35.3 C), Min:95 F (35 C), Max:96.2 F (35.7 C)  Recent Labs  Lab 07/28/17 0220  WBC 13.0*  CREATININE 0.55    CrCl cannot be calculated (Unknown ideal weight.).    No Known Allergies  Antimicrobials this admission: 6/2 Unasyn >   Thank you for allowing pharmacy to be a part of this patient's care.  Gardner CandleSheema M Braylon Lemmons, PharmD, BCPS Clinical Pharmacist 07/28/2017 4:40 AM

## 2017-07-28 NOTE — Consult Note (Signed)
Name: Jeanne [name] MRN: [id number] DOB: 08-30-1990    ADMISSION DATE:  07/28/2017 CONSULTATION DATE: 07/28/2017  REFERRING MD : Dr. Marcille Blanco   CHIEF COMPLAINT: Unresponsive   BRIEF PATIENT DESCRIPTION:  27 yo female admitted with alcohol intoxication requiring mechanical intubation for airway protection   SIGNIFICANT EVENTS/STUDIES:  06/2 Pt admitted to ICU mechanically intubated   HISTORY OF PRESENT ILLNESS:   This is a 27 yo female with no significant PMH who presented to [facility] ER via EMS on 06/2 from home after being found unresponsive.  Per ER notes the pts family stated they were drinking alcohol, however they stated the pt was not. Upon arrival to the ER the pt remained unresponsive, therefore she received 2 mg of iv narcan with no improvement in mentation requiring intubation for airway protection. Lab results revealed alcohol, ethyl level of 424 and urine drug screen negative.  She was subsequently admitted to ICU by hospitalist team for further workup and treatment     PAST MEDICAL HISTORY :   has no past medical history on file.  has a past surgical history that includes Dilation and evacuation (N/A, 02/09/2015) and laparoscopy (02/09/2015). Prior to Admission medications   Medication Sig Start Date End Date Taking? Authorizing Provider  ferrous sulfate 325 (65 FE) MG EC tablet Take 325 mg by mouth 3 (three) times daily with meals.    [provider]  ibuprofen (ADVIL,MOTRIN) 800 MG tablet Take 1 tablet (800 mg total) by mouth every 8 (eight) hours as needed for moderate pain or cramping. Patient not taking: Reported on 10/17/2016 02/09/15   Benjaman Kindler, MD  norethindrone-ethinyl estradiol (JUNEL FE,GILDESS FE,LOESTRIN FE) 1-20 MG-MCG tablet Take 1 tablet by mouth daily.    [provider]  ondansetron (ZOFRAN ODT) 4 MG disintegrating tablet Take 1 tablet (4 mg total) by mouth every 6 (six) hours as needed for nausea. Patient not taking: Reported  on 10/17/2016 02/09/15   Benjaman Kindler, MD  oxyCODONE-acetaminophen (PERCOCET/ROXICET) 5-325 MG tablet Take 1-2 tablets by mouth every 6 (six) hours as needed for moderate pain (You may take with ibuprofen). Patient not taking: Reported on 10/17/2016 02/09/15   Benjaman Kindler, MD  Prenatal Vit-Fe Fumarate-FA (MULTIVITAMIN-PRENATAL) 27-0.8 MG TABS tablet Take 1 tablet by mouth daily at 12 noon.    [provider]   No Known Allergies  FAMILY HISTORY:  family history is not on file. SOCIAL HISTORY:  reports that she has never smoked. She has never used smokeless tobacco. She reports that she does not drink alcohol or use drugs.  REVIEW OF SYSTEMS:   Unable to assess pt intubated   SUBJECTIVE:  Unable to assess pt intubated   VITAL SIGNS: Temp:  [95 F (35 C)] 95 F (35 C) (06/02 0250) Pulse Rate:  [73] 73 (06/02 0250) Resp:  [18] 18 (06/02 0250) BP: (104)/(65) 104/65 (06/02 0250) SpO2:  [100 %] 100 % (06/02 0250) Weight:  [54.4 kg (120 lb)] 54.4 kg (120 lb) (06/02 0252)  PHYSICAL EXAMINATION: General: acutely ill appearing female, NAD mechanically intubated  Neuro: agitated withdraws from painful stimulation, PERRL  HEENT: supple, no JVD Cardiovascular: nsr, rrr, no R/G Lungs: rhonchi throughout, even, non labored  Abdomen: +BS x4, soft, non distended Musculoskeletal: normal bulk and tone, no edema  Skin: intact no rashes or lesions  Recent Labs  Lab 07/28/17 0220  NA 135  K 2.8*  CL 103  CO2 20*  BUN 11  CREATININE 0.55  GLUCOSE 119*  Recent Labs  Lab 07/28/17 0220  HGB 12.8  HCT 37.4  WBC 13.0*  PLT 242   No results found.  ASSESSMENT / PLAN: Acute Encephalopathy secondary to Alcohol Intoxication  Mechanical Intubation  Hypokalemia  P: Full vent support-vent settings reviewed and established  SBT once all parameters met VAP bundle implemented  Continuous telemetry monitoring Maintain map >65 Trend BMP  Replace electrolytes as  indicated  Monitor UOP VTE px: subq lovenox Trend CBC Monitor for s/sx of bleeding and transfuse for hgb <7 Keep NPO for now if remains intubated in the next 24hrs will start TF's SUP px: iv pepcid Trend WBC and monitor fever curve Will start unasyn for aspiration  Maintain RASS goal 0 to -1 Propofol gtt and prn fentanyl to maintain RASS goal  WUA daily   Marda Stalker, [place] Pager [phone] (please enter 7 digits) PCCM Consult Pager [phone] (please enter 7 digits)

## 2017-07-28 NOTE — Progress Notes (Signed)
[employer] - [place] [facility]                                                                                                                                                                                  Patient Demographics   Jeanne [name], is a 27 y.o. female, DOB - 1990/06/04, RUE:[id number]  Admit date - 07/28/2017   Admitting Physician Arnaldo Natal, MD  Outpatient Primary MD for the patient is Department, [facility]   LOS - 0  Subjective: Patient admitted with acute respiratory failure unresponsiveness  Patient was extubated earlier   Review of Systems:   CONSTITUTIONAL: Able to nod yes or no currently.    Vitals:   Vitals:   07/28/17 0900 07/28/17 1000 07/28/17 1100 07/28/17 1200  BP: (!) 103/56 (!) 115/56 112/64 (!) 112/57  Pulse: 92 (!) 116 86 (!) 133  Resp: 18 (!) 21 17 16   Temp: 97.9 F (36.6 C) 98.2 F (36.8 C) 98.8 F (37.1 C) 99.3 F (37.4 C)  TempSrc:      SpO2: 98% 99% 98% 100%  Weight:      Height:        Wt Readings from Last 3 Encounters:  07/28/17 54.4 kg (120 lb)  10/17/16 70.8 kg (156 lb)  02/09/15 52.6 kg (116 lb)     Intake/Output Summary (Last 24 hours) at 07/28/2017 1321 Last data filed at 07/28/2017 0800 Gross per 24 hour  Intake 1728.33 ml  Output 2150 ml  Net -421.67 ml    Physical Exam:   GENERAL: Pleasant-appearing in no apparent distress.  HEAD, EYES, EARS, NOSE AND THROAT: Atraumatic, normocephalic. Extraocular muscles are intact. Pupils equal and reactive to light. Sclerae anicteric. No conjunctival injection. No oro-pharyngeal erythema.  NECK: Supple. There is no jugular venous distention. No bruits, no lymphadenopathy, no thyromegaly.  HEART: Regular rate and rhythm,. No murmurs, no rubs, no clicks.  LUNGS: Clear to auscultation bilaterally. No rales or rhonchi. No wheezes.  ABDOMEN: Soft, flat, nontender, nondistended. Has good bowel sounds. No hepatosplenomegaly appreciated.   EXTREMITIES: No evidence of any cyanosis, clubbing, or peripheral edema.  +2 pedal and radial pulses bilaterally.  NEUROLOGIC: The patient is alert, awake, and oriented x3 with no focal motor or sensory deficits appreciated bilaterally.  SKIN: Moist and warm with no rashes appreciated.  Psych: Not anxious, depressed LN: No inguinal LN enlargement    Antibiotics   Anti-infectives (From admission, onward)   Start     Dose/Rate Route Frequency Ordered Stop   07/28/17 0600  Ampicillin-Sulbactam (UNASYN) 3 g in sodium chloride 0.9 % 100 mL IVPB     3 g 200 mL/hr  over 30 Minutes Intravenous Every 6 hours 07/28/17 0439        Medications   Scheduled Meds: . docusate sodium  100 mg Oral BID  . enoxaparin (LOVENOX) injection  40 mg Subcutaneous Q24H   Continuous Infusions: . sodium chloride 100 mL/hr at 07/28/17 1209  . ampicillin-sulbactam (UNASYN) IV 3 g (07/28/17 1222)  . famotidine (PEPCID) IV    . propofol (DIPRIVAN) infusion Stopped (07/28/17 0813)   PRN Meds:.acetaminophen **OR** acetaminophen, fentaNYL (SUBLIMAZE) injection, fentaNYL (SUBLIMAZE) injection, ondansetron **OR** ondansetron (ZOFRAN) IV   Data Review:   Micro Results Recent Results (from the past 240 hour(s))  MRSA PCR Screening     Status: None   Collection Time: 07/28/17  5:32 AM  Result Value Ref Range Status   MRSA by PCR NEGATIVE NEGATIVE Final    Comment:        The GeneXpert MRSA Assay (FDA approved for NASAL specimens only), is one component of a comprehensive MRSA colonization surveillance program. It is not intended to diagnose MRSA infection nor to guide or monitor treatment for MRSA infections. Performed at [facility], [address] [place], [place] [address]     Radiology Reports Dg Chest Portable 1 View  Result Date: 07/28/2017 CLINICAL DATA:  Endotracheal tube and orogastric tube placement. EXAM: PORTABLE CHEST 1 VIEW COMPARISON:  None. FINDINGS: The patient's  endotracheal tube is seen ending just above the carina. This could be retracted 2-3 cm. An enteric tube is noted ending overlying the stomach. The lungs are well-aerated and clear. There is no evidence of focal opacification, pleural effusion or pneumothorax. The cardiomediastinal silhouette is within normal limits. No acute osseous abnormalities are seen. IMPRESSION: 1. Endotracheal tube seen ending just above the carina. This could be retracted 2-3 cm, as deemed clinically appropriate. 2. Enteric tube noted ending overlying the stomach. 3. No acute cardiopulmonary process seen. Electronically Signed   By: Roanna Raider M.D.   On: 07/28/2017 03:45     CBC Recent Labs  Lab 07/28/17 0220  WBC 13.0*  HGB 12.8  HCT 37.4  PLT 242  MCV 89.3  MCH 30.6  MCHC 34.3  RDW 12.8    Chemistries  Recent Labs  Lab 07/28/17 0220 07/28/17 1159  NA 135 139  K 2.8* 3.7  CL 103 108  CO2 20* 18*  GLUCOSE 119* 105*  BUN 11 6  CREATININE 0.55 0.44  CALCIUM 8.0* 7.8*  MG 1.9  --   AST 47*  --   ALT 74*  --   ALKPHOS 82  --   BILITOT 0.4  --    ------------------------------------------------------------------------------------------------------------------ estimated creatinine clearance is 85.8 mL/min (by C-G formula based on SCr of 0.44 mg/dL). ------------------------------------------------------------------------------------------------------------------ Recent Labs    07/28/17 0220  HGBA1C 5.2   ------------------------------------------------------------------------------------------------------------------ Recent Labs    07/28/17 0502  TRIG 293*   ------------------------------------------------------------------------------------------------------------------ Recent Labs    07/28/17 0220  TSH 5.012*   ------------------------------------------------------------------------------------------------------------------ No results for input(s): VITAMINB12, FOLATE, FERRITIN, TIBC,  IRON, RETICCTPCT in the last 72 hours.  Coagulation profile No results for input(s): INR, PROTIME in the last 168 hours.  No results for input(s): DDIMER in the last 72 hours.  Cardiac Enzymes Recent Labs  Lab 07/28/17 0220  TROPONINI <0.03   ------------------------------------------------------------------------------------------------------------------ Invalid input(s): POCBNP    Assessment & Plan  Patient is 27 year old admitted with decreased responsiveness and acute respiratory failure 1.  Respiratory failure: With hypoxia; secondary to severe alcohol intoxication.   Status post extubation Continue to  monitor for now    2.  Alcohol heavy use during this incident unclear if she drinks heavily on chronic basis continue monitor for any signs of withdrawal according to family patient is not a daily drinker  3.  DVT prophylaxis Lovenox       Code Status Orders  (From admission, onward)        Start     Ordered   07/28/17 0432  Full code  Continuous     07/28/17 0431    Code Status History    Date Active Date Inactive Code Status Order ID Comments User Context   10/17/2016 2039 10/18/2016 2228 Full Code [id number]  Ward, Elenora Fenderhelsea C, MD Inpatient   10/17/2016 0805 10/17/2016 2039 Full Code [id number]  WardElenora Fender, Chelsea C, MD Inpatient   10/17/2016 0612 10/17/2016 0805 Full Code [id number]  Candyce ChurnNorris, Stephanie, RN Inpatient           Consults  intensivist  DVT Prophylaxis  Lovenox   Lab Results  Component Value Date   PLT 242 07/28/2017     Time Spent in minutes   35 minutes of critical care time spent greater than 50% of time spent in care coordination and counseling patient regarding the condition and plan of care.   Auburn BilberryShreyang Orvill Coulthard M.D on 07/28/2017 at 1:21 PM  Between 7am to 6pm - Pager - [phone]  After 6pm go to [email or web address] - Social research officer, governmentpassword [email or web address]  [employer]   Office  [phone]

## 2017-07-28 NOTE — Progress Notes (Signed)
Patient extubated around 1130. No respiratory distress today, patient has been stable. Will continue to monitor. Patient changed to stepdown. Jeanne KusterBrandi R Mansfield

## 2017-07-28 NOTE — Progress Notes (Signed)
ETT pulled 2 cm ,secured at 20 cm , sat 100 %, pt resting.

## 2017-07-29 DIAGNOSIS — G9340 Encephalopathy, unspecified: Secondary | ICD-10-CM

## 2017-07-29 MED ORDER — AMOXICILLIN-POT CLAVULANATE 875-125 MG PO TABS: 1.0000 | ORAL_TABLET | Freq: Two times a day (BID) | ORAL | 0 refills | Status: AC

## 2017-07-29 MED ORDER — FAMOTIDINE 20 MG PO TABS: 20.0000 mg | ORAL_TABLET | Freq: Two times a day (BID) | ORAL | Status: DC

## 2017-07-29 NOTE — Progress Notes (Signed)
Patient discharged home with family. Patient did not have primary care doctor to follow up with, health department stated they do not see hospital follow ups. [facility]. Patient had no questions at discharge. Jeanne [name]

## 2017-07-29 NOTE — Progress Notes (Addendum)
Follow up - Critical Care Medicine Note  Patient Details:    Jeanne [name] is an 27 y.o. female.with no significant past medical history, was found unresponsive at home, felt to be secondary to alcohol intoxication was intubated for airway protection. Has been extubated overnight, presently awake, alert, in no acute distress, talking without any difficulty on room air.  Lines, Airways, Drains:    Anti-infectives:  Anti-infectives (From admission, onward)   Start     Dose/Rate Route Frequency Ordered Stop   07/29/17 0000  amoxicillin-clavulanate (AUGMENTIN) 875-125 MG tablet     1 tablet Oral 2 times daily 07/29/17 0829 08/03/17 2359   07/28/17 0600  Ampicillin-Sulbactam (UNASYN) 3 g in sodium chloride 0.9 % 100 mL IVPB     3 g 200 mL/hr over 30 Minutes Intravenous Every 6 hours 07/28/17 0439        Microbiology: Results for orders placed or performed during the hospital encounter of 07/28/17  MRSA PCR Screening     Status: None   Collection Time: 07/28/17  5:32 AM  Result Value Ref Range Status   MRSA by PCR NEGATIVE NEGATIVE Final    Comment:        The GeneXpert MRSA Assay (FDA approved for NASAL specimens only), is one component of a comprehensive MRSA colonization surveillance program. It is not intended to diagnose MRSA infection nor to guide or monitor treatment for MRSA infections. Performed at [facility], [address] [place], [place] [id number]     Studies: Dg Chest Portable 1 View  Result Date: 07/28/2017 CLINICAL DATA:  Endotracheal tube and orogastric tube placement. EXAM: PORTABLE CHEST 1 VIEW COMPARISON:  None. FINDINGS: The patient's endotracheal tube is seen ending just above the carina. This could be retracted 2-3 cm. An enteric tube is noted ending overlying the stomach. The lungs are well-aerated and clear. There is no evidence of focal opacification, pleural effusion or pneumothorax. The cardiomediastinal silhouette is within  normal limits. No acute osseous abnormalities are seen. IMPRESSION: 1. Endotracheal tube seen ending just above the carina. This could be retracted 2-3 cm, as deemed clinically appropriate. 2. Enteric tube noted ending overlying the stomach. 3. No acute cardiopulmonary process seen. Electronically Signed   By: Roanna RaiderJeffery  Chang M.D.   On: 07/28/2017 03:45    Consults: Treatment Team:  Uvaldo RisingSamaan, Maged, MD   Subjective:    Overnight Issues: no issues overnight, extubated, presently doing well, on room air talking, states that this was inadvertent and not purposeful  Objective:  Vital signs for last 24 hours: Temp:  [97.9 F (36.6 C)-100.8 F (38.2 C)] 98.8 F (37.1 C) (06/03 0800) Pulse Rate:  [65-133] 78 (06/03 0800) Resp:  [14-23] 18 (06/03 0800) BP: (88-125)/(42-77) 112/77 (06/03 0800) SpO2:  [96 %-100 %] 98 % (06/03 0800) FiO2 (%):  [24 %] 24 % (06/02 0900)  Hemodynamic parameters for last 24 hours:    Intake/Output from previous day: 06/02 0701 - 06/03 0700 In: 2953.8 [P.O.:360; I.V.:2143.8; IV Piggyback:450] Out: 2255 [Urine:2255]  Intake/Output this shift: Total I/O In: 700 [I.V.:500; IV Piggyback:200] Out: -   Vent settings for last 24 hours: Vent Mode: Spontaneous FiO2 (%):  [24 %] 24 % PEEP:  [5 cmH20] 5 cmH20 Pressure Support:  [5 cmH20] 5 cmH20  Physical Exam:  Patient is awake, alert, no acute distress, Vital signs: Please see the above listed vital signs HEENT: Trachea is midline, no thyromegaly noted, no accessory muscle utilization, on room air Cardiovascular: Regular rate  and rhythm Pulmonary: Clear to auscultation Abdominal: Positive bowel sounds, soft exam Extremities: No clubbing, cyanosis or edema Neurologic: No focal deficits appreciated  Assessment and plan:  Alcohol intoxication. Patient required airway protection. Was noted on serology to have blood alcohol level of 424 Patient is doing well, awake, alert, no acute distress, status post  extubation for airway protection during intoxication. Patient shows no evidence of pneumonia on chest x-ray Stable hemodynamics and physical exam at this time. Discuss with hospitalist service, patient is stable for discharge. She is to call us immediately if she has any difficulty. Is being discharged with a seven-day course of antibiotics. Of note patient has an elevated TSH at 5.01218 to be rechecked in the outpatient setting, also has elevated triglyceride at 293   [facility], DO

## 2017-07-29 NOTE — Discharge Summary (Signed)
[facility] [place] at [facility] Jeanne [name], [place] y.o., DOB [birth year], MRN [id number]. Admission date: 07/28/2017 Discharge Date 07/29/2017 Primary MD Department, [facility] Admitting Physician Arnaldo Natal, MD  Admission Diagnosis  Unresponsive [R41.89] Hypothermia, initial encounter [T68.XXXA] Alcoholic intoxication with complication [facility]) [F10.929]  Discharge Diagnosis   Active Problems: Acute respiratory failure related to alcohol intoxication and unresponsiveness Hypothermia related to alcohol intoxication Heavy alcohol ingestion     Hospital Course  The patient with no chronic medical problems presents to the emergency department via EMS after being found unresponsive at home.  Family reports that there were many individuals drinking at a party but they were unaware that the patient had any alcohol.  Laboratory evaluation revealed otherwise with a blood alcohol level of greater than 400.  The patient was not protecting her airway so was intubated prior to the emergency department staff calling the hospitalist service for admission.  Patient was admitted to the ICU.  And kept on the ventilator subsequently extubated.  She did have some low-grade fever and some cough therefore she is being treated with Augmentin.  She is doing well and is on room air and stable for discharge.  I have instructed patient to be careful with her alcohol intake.              Consults  pulmonary/intensive care  Significant Tests:  See full reports for all details    Dg Chest Portable 1 View  Result Date: 07/28/2017 CLINICAL DATA:  Endotracheal tube and orogastric tube placement. EXAM: PORTABLE CHEST 1 VIEW COMPARISON:  None. FINDINGS: The patient's endotracheal tube is seen ending just above the carina. This could be retracted 2-3 cm. An enteric tube is noted ending overlying the stomach. The lungs are well-aerated and clear. There is no evidence of  focal opacification, pleural effusion or pneumothorax. The cardiomediastinal silhouette is within normal limits. No acute osseous abnormalities are seen. IMPRESSION: 1. Endotracheal tube seen ending just above the carina. This could be retracted 2-3 cm, as deemed clinically appropriate. 2. Enteric tube noted ending overlying the stomach. 3. No acute cardiopulmonary process seen. Electronically Signed   By: Roanna Raider M.D.   On: 07/28/2017 03:45       Today   Subjective:   Jeanne [name]   patient feeling well denies any complaints  Objective:   Blood pressure 112/72, pulse 79, temperature 98.8 F (37.1 C), resp. rate 18, height 5\' 2"  (1.575 m), weight 54.4 kg (120 lb), SpO2 98 %, unknown if currently breastfeeding.  .  Intake/Output Summary (Last 24 hours) at 07/29/2017 1623 Last data filed at 07/29/2017 0956 Gross per 24 hour  Intake 2550 ml  Output 2105 ml  Net 445 ml    Exam VITAL SIGNS: Blood pressure 112/72, pulse 79, temperature 98.8 F (37.1 C), resp. rate 18, height 5\' 2"  (1.575 m), weight 54.4 kg (120 lb), SpO2 98 %, unknown if currently breastfeeding.  GENERAL:  27 y.o.-year-old patient lying in the bed with no acute distress.  EYES: Pupils equal, round, reactive to light and accommodation. No scleral icterus. Extraocular muscles intact.  HEENT: Head atraumatic, normocephalic. Oropharynx and nasopharynx clear.  NECK:  Supple, no jugular venous distention. No thyroid enlargement, no tenderness.  LUNGS: Normal breath sounds bilaterally, no wheezing, rales,rhonchi or crepitation. No use of accessory muscles of respiration.  CARDIOVASCULAR: S1, S2 normal. No murmurs, rubs, or gallops.  ABDOMEN: Soft, nontender, nondistended. Bowel sounds present. No organomegaly or  mass.  EXTREMITIES: No pedal edema, cyanosis, or clubbing.  NEUROLOGIC: Cranial nerves II through XII are intact. Muscle strength 5/5 in all extremities. Sensation intact. Gait not checked.  PSYCHIATRIC:  The patient is alert and oriented x 3.  SKIN: No obvious rash, lesion, or ulcer.   Data Review     CBC w Diff:  Lab Results  Component Value Date   WBC 13.0 (H) 07/28/2017   HGB 12.8 07/28/2017   HGB 7.4 (L) 01/08/2013   HCT 37.4 07/28/2017   HCT 21.9 (L) 01/08/2013   PLT 242 07/28/2017   PLT 121 (L) 01/08/2013   LYMPHOPCT 11 02/09/2015   LYMPHOPCT 13.8 01/08/2013   MONOPCT 2 02/09/2015   MONOPCT 5.7 01/08/2013   EOSPCT 0 02/09/2015   EOSPCT 1.8 01/08/2013   BASOPCT 0 02/09/2015   BASOPCT 0.2 01/08/2013   CMP:  Lab Results  Component Value Date   NA 139 07/28/2017   K 3.7 07/28/2017   CL 108 07/28/2017   CO2 18 (L) 07/28/2017   BUN 6 07/28/2017   CREATININE 0.44 07/28/2017   PROT 7.3 07/28/2017   ALBUMIN 4.1 07/28/2017   BILITOT 0.4 07/28/2017   ALKPHOS 82 07/28/2017   AST 47 (H) 07/28/2017   ALT 74 (H) 07/28/2017  .  Micro Results Recent Results (from the past 240 hour(s))  MRSA PCR Screening     Status: None   Collection Time: 07/28/17  5:32 AM  Result Value Ref Range Status   MRSA by PCR NEGATIVE NEGATIVE Final    Comment:        The GeneXpert MRSA Assay (FDA approved for NASAL specimens only), is one component of a comprehensive MRSA colonization surveillance program. It is not intended to diagnose MRSA infection nor to guide or monitor treatment for MRSA infections. Performed at [facility], [address], [place], [place] [address]      Code Status History    Date Active Date Inactive Code Status Order ID Comments User Context   07/28/2017 0432 07/29/2017 1438 Full Code [id number]  Arnaldo Natal, MD Inpatient   10/17/2016 2039 10/18/2016 2228 Full Code [id number]  Ward, Elenora Fender, MD Inpatient   10/17/2016 0805 10/17/2016 2039 Full Code [id number]  Ward, Elenora Fender, MD Inpatient   10/17/2016 0612 10/17/2016 0805 Full Code [id number]  Candyce Churn, RN Inpatient          Follow-up Information    [facility] Follow up on 08/03/2017.   Specialty:  General Practice Why:  Follow up at 8:20 am on Satuday, 08/03/17 Contact information: [address] [place] [address] [phone]           Discharge Medications   Allergies as of 07/29/2017   No Known Allergies     Medication List    TAKE these medications   amoxicillin-clavulanate 875-125 MG tablet Commonly known as:  AUGMENTIN Take 1 tablet by mouth 2 (two) times daily for 5 days.   ferrous sulfate 325 (65 FE) MG EC tablet Take 325 mg by mouth 3 (three) times daily with meals.   ibuprofen 800 MG tablet Commonly known as:  ADVIL,MOTRIN Take 1 tablet (800 mg total) by mouth every 8 (eight) hours as needed for moderate pain or cramping.   multivitamin-prenatal 27-0.8 MG Tabs tablet Take 1 tablet by mouth daily at 12 noon.   norethindrone-ethinyl estradiol 1-20 MG-MCG tablet Commonly known as:  JUNEL FE,GILDESS FE,LOESTRIN FE Take 1 tablet by mouth daily.  ondansetron 4 MG disintegrating tablet Commonly known as:  ZOFRAN ODT Take 1 tablet (4 mg total) by mouth every 6 (six) hours as needed for nausea.   oxyCODONE-acetaminophen 5-325 MG tablet Commonly known as:  PERCOCET/ROXICET Take 1-2 tablets by mouth every 6 (six) hours as needed for moderate pain (You may take with ibuprofen).          Total Time in preparing paper work, data evaluation and todays exam - 35 minutes  Auburn BilberryShreyang Journie Howson M.D on 07/29/2017 at 4:23 PM [facility]   Office  [phone]

## 2017-07-29 NOTE — Plan of Care (Signed)
Patient alert and oriented. Able to make needs known.  Afebrile.  Denies pain/discomfort.  Tolerating medications. No nausea/vomiting noted.  Will continue to monitor.

## 2017-07-29 NOTE — Progress Notes (Signed)
Patient ID: Jeanne SantosJosefina M Lopez Torres, female   DOB: 05-17-1990, 27 y.o.   MRN: [id number]   Pulmonary / Critical Care  Family Note  Please excuse Mr. Caren GriffinsFelix Merino from work for this weekend and today He had a [facility] on in [facility] ICU [facility]  [place], [place]

## 2017-07-29 NOTE — Progress Notes (Signed)
[facility] - [place] at [facility]    Jeanne [name] was admitted to the Hospital on 07/28/2017 and Discharged  07/29/2017 and should be excused from work/school   for4  days starting 07/28/2017 , may return to work/school without any restrictions on 07/31/17.   Call Jeanne BilberryShreyang Jihaad Bruschi MD with questions.  Jeanne BilberryShreyang Izaan [name] M.D on 07/29/2017,at 8:26 AM  [facility] - [place] at [facility]    Office  [phone]

## 2017-08-12 ENCOUNTER — Telehealth: Payer: Self-pay | Admitting: Licensed Clinical Social Worker

## 2017-08-12 NOTE — Telephone Encounter (Signed)
CSW contacted patient regarding an EMMI callback response. Patient had indicated she had lost interest in things. CSW spoke with patient and she stated that she was doing much better and that she did not realize she had chosen the option she did. She states that she has been improving every day is very happy about how she is progressing. Patient expressed no worries or concerns at this time. York SpanielMonica Teryl Mcconaghy MSW,LCSW [phone]

## 2018-02-26 NOTE — L&D Delivery Note (Signed)
Delivery Note  Date of delivery: 01/31/2019 Estimated Date of Delivery: 01/30/19 Patient's last menstrual period was 04/25/2018. EGA: [redacted]w[redacted]d  First Stage: Labor onset: 01/31/2019 0001 Augmentation : AROM, pitocin Analgesia Jeanne [name] intrapartum: IV fentanyl AROM at 1123 for clear fluid  Jeanne [name] presented to L&D with contractions. She was augmented with AROM and pitocin. IV fentanyl given for pain relief.   Second Stage: Complete dilation at 1338 Onset of pushing at 1348 FHR second stage: category II Delivery at 1358 on 01/31/2019  She progressed to complete and had a spontaneous vaginal birth of a live female over an intact perineum. The fetal head was delivered in direct OA position, rotating to direct OP, then restituting to ROP. One loose loop of nuchal cord, reduced on the perineum. Anterior then posterior shoulders delivered with minimal assistance. Baby placed on mom's abdomen and attended to by transition RN. Cord clamped and cut when pulseless by father of the baby. Cord blood obtained for newborn labs.  Third Stage: Placenta delivered spontaneously intact with 3VC at 1403 Placenta disposition: pathology Uterine tone firm / bleeding scant IV pitocin given for hemorrhage prophylaxis  No laceration identified  Anesthesia for repair: n/a Repair n/a Est. Blood Loss (mL): 024  Complications: none  Mom to postpartum.  Baby to Couplet care / Skin to Skin.  Newborn: Birth Weight: pending  Apgar Scores: 8, 9 Feeding planned: breast and formula   Jeanne [name], Jeanne [name] 01/31/2019 2:24 PM

## 2018-07-11 LAB — OB RESULTS CONSOLE RPR: RPR: NONREACTIVE

## 2018-07-11 LAB — OB RESULTS CONSOLE GC/CHLAMYDIA
Chlamydia: NEGATIVE
Gonorrhea: NEGATIVE

## 2018-07-11 LAB — OB RESULTS CONSOLE HEPATITIS B SURFACE ANTIGEN: Hepatitis B Surface Ag: NEGATIVE

## 2018-07-11 LAB — OB RESULTS CONSOLE ANTIBODY SCREEN: Antibody Screen: NEGATIVE

## 2018-07-11 LAB — OB RESULTS CONSOLE ABO/RH: RH Type: POSITIVE

## 2018-07-11 LAB — OB RESULTS CONSOLE HIV ANTIBODY (ROUTINE TESTING): HIV: NONREACTIVE

## 2018-07-11 LAB — OB RESULTS CONSOLE HGB/HCT, BLOOD: Hemoglobin: 12.5

## 2018-07-11 LAB — OB RESULTS CONSOLE RUBELLA ANTIBODY, IGM: Rubella: IMMUNE

## 2018-07-11 LAB — OB RESULTS CONSOLE VARICELLA ZOSTER ANTIBODY, IGG: Varicella: IMMUNE

## 2018-07-14 ENCOUNTER — Other Ambulatory Visit: Payer: Self-pay | Admitting: Nurse Practitioner

## 2018-07-14 DIAGNOSIS — Z369 Encounter for antenatal screening, unspecified: Secondary | ICD-10-CM

## 2018-07-17 ENCOUNTER — Telehealth: Payer: Self-pay | Admitting: Obstetrics and Gynecology

## 2018-07-17 NOTE — Telephone Encounter (Signed)
I spoke with Ms. Jeanne [name] to rescheduled the genetic counseling portion of her visit to [facility] to a telephone consultation due to COVID restrictions.  She will have the virutal genetic counseling visit on 07/24/18 at 10am and keep the appointment at [facility] on Monday, June 1 at 11am for an ultrasound and lab testing as desired.  Cherly Anderson, MS, CGC

## 2018-07-24 ENCOUNTER — Ambulatory Visit
Admission: RE | Admit: 2018-07-24 | Discharge: 2018-07-24 | Disposition: A | Discharge status: Home / Self Care | Payer: BLUE CROSS/BLUE SHIELD | Source: Ambulatory Visit | Attending: Maternal & Fetal Medicine | Admitting: Maternal & Fetal Medicine

## 2018-07-24 ENCOUNTER — Other Ambulatory Visit: Payer: Self-pay

## 2018-07-24 DIAGNOSIS — Z36 Encounter for antenatal screening for chromosomal anomalies: Secondary | ICD-10-CM

## 2018-07-24 NOTE — Progress Notes (Signed)
Virtual Visit via Telephone Note  I connected with Jeanne [name] on Jul 24, 2018 at 10:00 AM EDT by telephone and verified that I am speaking with the correct person using two identifiers.  Referring physician:  [facility] Department Length of consultation:  25 minutes  Ms. Jeanne [name]  was referred to [facility] for genetic counseling to review prenatal screening and testing options.  This note summarizes the information we discussed.    We offered the following routine screening tests for this pregnancy:  The most accurate screening option for chromosome conditions is cell free fetal DNA testing.  Though this is typically reserved for pregnancies at increased risk for aneuploidy, it is currently being made available and many insurance companies are adding coverage for this testing in low risk patients during COVID.  This test utilizes a maternal blood sample and DNA sequencing technology to isolate circulating cell free fetal DNA from maternal plasma.  The fetal DNA can then be analyzed for DNA sequences that are derived from the three most common chromosomes involved in aneuploidy, chromosomes 13, 18, and 21.  If the overall amount of DNA is greater than the expected level for any of these chromosomes, aneuploidy is suspected.  The detection rates are >99% for Down syndrome, >98% for trisomy 18 and >91% for Trisomy 13.  While we do not consider it a replacement for invasive testing and karyotype analysis, a negative result from this testing would be reassuring, though not a guarantee of a normal chromosome complement for the baby.  An abnormal result may be suggestive of an abnormal chromosome complement, though we would still recommend CVS or amniocentesis to confirm any findings from this testing.  First trimester screening, which includes nuchal translucency ultrasound screen and first trimester maternal serum marker screening, is the test that  has most recently been available for low risk patients.  The nuchal translucency has approximately an 80% detection rate for Down syndrome and can be positive for other chromosome abnormalities as well as congenital heart defects.  When combined with a maternal serum marker screening, the detection rate is up to 90% for Down syndrome and up to 97% for trisomy 18.   Given current recommendations during COVID, we are offering only the biochemical testing portion of this testing (without the ultrasound and NT portion), which has a much lower detection rate.  Maternal serum marker screening, or "quad" screen, is a blood test that measures pregnancy proteins, can provide risk assessments for Down syndrome, trisomy 18, and open neural tube defects (spina bifida, anencephaly). Because it does not directly examine the fetus, it cannot positively diagnose or rule out these problems. This is a second trimester option which could be offered along with the anatomy ultrasound. It can detect approximately 75% of babies with Down syndrome, 80% of babies with open spina bifida and 70% of babies with trisomy 54.  Targeted ultrasound uses high frequency sound waves to create an image of the developing fetus.  An ultrasound is often recommended as a routine means of evaluating the pregnancy.  It is also used to screen for fetal anatomy problems (for example, a heart defect) that might be suggestive of a chromosomal or other abnormality. We are currently not recommending a first trimester ultrasound other than that which would be ordered for dating and viability.  Should these screening tests indicate an increased concern, then the following additional testing options would be offered:  The chorionic villus sampling procedure  is available for first trimester chromosome analysis.  This involves the withdrawal of a small amount of chorionic villi (tissue from the developing placenta).  Risk of pregnancy loss is estimated to be  approximately 1 in 200 to 1 in 100 (0.5 to 1%).  There is approximately a 1% (1 in 100) chance that the CVS chromosome results will be unclear.  Chorionic villi cannot be tested for neural tube defects.     Amniocentesis involves the removal of a small amount of amniotic fluid from the sac surrounding the fetus with the use of a thin needle inserted through the maternal abdomen and uterus.  Ultrasound guidance is used throughout the procedure.  Fetal cells from amniotic fluid are directly evaluated and > 99.5% of chromosome problems and > 98% of open neural tube defects can be detected. This procedure is generally performed after the 15th week of pregnancy.  The main risks to this procedure include complications leading to miscarriage in less than 1 in 200 cases (0.5%).  Cystic Fibrosis and Spinal Muscular Atrophy (SMA) screening were also discussed with the patient. Both conditions are recessive, which means that both parents must be carriers in order to have a child with the disease.  Cystic fibrosis (CF) is one of the most common genetic conditions in persons of Caucasian ancestry.  This condition occurs in approximately 1 in 2,500 Caucasian persons and results in thickened secretions in the lungs, digestive, and reproductive systems.  For a baby to be at risk for having CF, both of the parents must be carriers for this condition.  Approximately 1 in 23 Caucasian persons is a carrier for CF.  Current carrier testing looks for the most common mutations in the gene for CF and can detect approximately 90% of carriers in the Caucasian population.  This means that the carrier screening can greatly reduce, but cannot eliminate, the chance for an individual to have a child with CF.  If an individual is found to be a carrier for CF, then carrier testing would be available for the partner. As part of [place]'s newborn screening profile, all babies born in the state of [place] will have a two-tier  screening process.  Specimens are first tested to determine the concentration of immunoreactive trypsinogen (IRT).  The top 5% of specimens with the highest IRT values then undergo DNA testing using a panel of over 40 common CF mutations. SMA is a neurodegenerative disorder that leads to atrophy of skeletal muscle and overall weakness.  This condition is also more prevalent in the Caucasian population, with 1 in 40-1 in 60 persons being a carrier and 1 in 6,000-1 in 10,000 children being affected.  There are multiple forms of the disease, with some causing death in infancy to other forms with survival into adulthood.  The genetics of SMA is complex, but carrier screening can detect up to 95% of carriers in the Caucasian population.  Similar to CF, a negative result can greatly reduce, but cannot eliminate, the chance to have a child with SMA. The patient declined carrier screening for CF and SMA.  We talked about the option of signing up for Early Check to have the baby tested for SMA after delivery as part of a new study in [place].  This registration can be done online prior to delivery if desired. We also offered the option of hemoglobinopathy screening.  We obtained a detailed family history and pregnancy history. The patient and her partner are of [place] ancestry. The  family history was reported to be unremarkable for birth defects, intellectual delays, recurrent pregnancy loss or known chromosome abnormalities.  This is the fourth pregnancy for Ms Cleone SlimLopez [name] and her husband.  They have a healthy 26 year old daughter and healthy 26 year old son.  They had one early miscarriage.  He has a healthy 26 year old son from a prior relationship. In the current pregnancy, she reported no complications or exposures to medications, alcohol, tobacco or recreational drugs.   After consideration of the options, Jeanne [name] elected to have MaterniT21 PLUS with SCA drawn at her visit to [facility] on  Monday, July 28, 2018.  This will minimize the number of visits that she needs to attend in person.  The patient declined carrier testing for hemoglobinopathies, CF and SMA.  The patient was encouraged to call with questions or concerns.  We can be contacted at [phone].  Labs ordered: MaterniT21 to be [facility] at [facility] on 07/28/18 following ultrasound.  Cherly Andersoneborah F. Costella Schwarz, MS, CGC  I provided 25 minutes of non-face-to-face time during this encounter.   Katrina StackWells, Angie Piercey

## 2018-07-28 ENCOUNTER — Ambulatory Visit: Payer: Medicaid Other

## 2018-07-28 ENCOUNTER — Other Ambulatory Visit: Payer: Self-pay

## 2018-07-28 ENCOUNTER — Telehealth: Payer: Self-pay | Admitting: Obstetrics and Gynecology

## 2018-07-28 ENCOUNTER — Ambulatory Visit
Admission: RE | Admit: 2018-07-28 | Discharge: 2018-07-28 | Disposition: A | Discharge status: Home / Self Care | Payer: Medicaid Other | Source: Ambulatory Visit | Attending: Obstetrics and Gynecology | Admitting: Obstetrics and Gynecology

## 2018-07-28 DIAGNOSIS — Z3682 Encounter for antenatal screening for nuchal translucency: Secondary | ICD-10-CM | POA: Diagnosis not present

## 2018-07-28 DIAGNOSIS — Z369 Encounter for antenatal screening, unspecified: Secondary | ICD-10-CM

## 2018-07-28 IMAGING — US US MFM OB COMP LESS THAN 14 WEEKS
1 series · 13 of 28 positions shown · non-contrast
Comparison: none

PATIENT INFO:

             HAGAN
PERFORMED BY:
                   Sonographer
                   NILS CNM
SERVICE(S) PROVIDED:
  US MFM OB COMP LESS THAN 14 WEEKS                    76801.4
 ----------------------------------------------------------------------
INDICATIONS:
  13 weeks gestation of pregnancy
FETAL EVALUATION:
 Num Of Fetuses:         1
 Fetal Heart Rate(bpm):  153
 Cardiac Activity:       Present
 Presentation:           Transverse, head to maternal right
 Placenta:               Low-lying posterior placenta
 P. Cord Insertion:      Normal
BIOMETRY:
 CRL:      67.5  mm     G. Age:  13w 0d                  EDD:   02/02/19
GESTATIONAL AGE:
 LMP:           13w 3d        Date:  04/25/18                 EDD:   01/30/19
 Best:          13w 3d     Det. By:  LMP  (04/25/18)          EDD:   01/30/19
ANATOMY:
 Cranium:               Within Normal Limits   Bladder:                Seen
 Choroid Plexus:        Within normal limits   Upper Extremities:      Visualized
                        for gestational age
 Stomach:               Seen                   Lower Extremities:      Visualized
 Abdominal Wall:        Within normal limits

[Series 1: us mfm ob comp less than 14 weeks · 0.17mm/px · 13 of 47 slices shown]
[im 2/47]
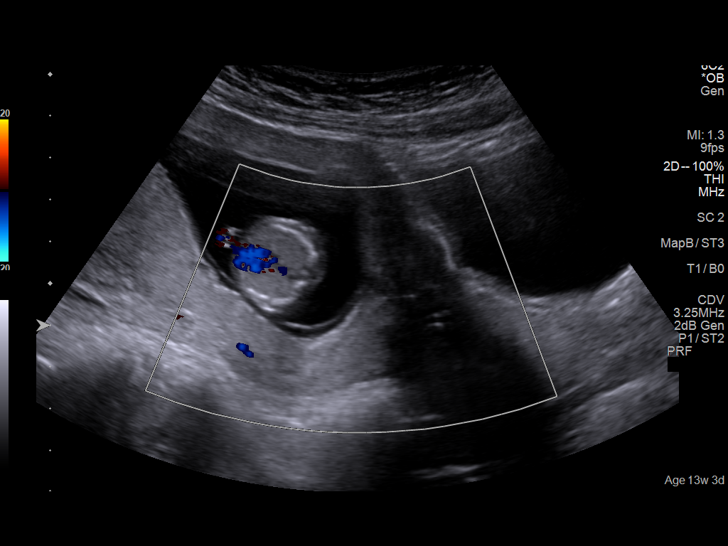
[im 6/47]
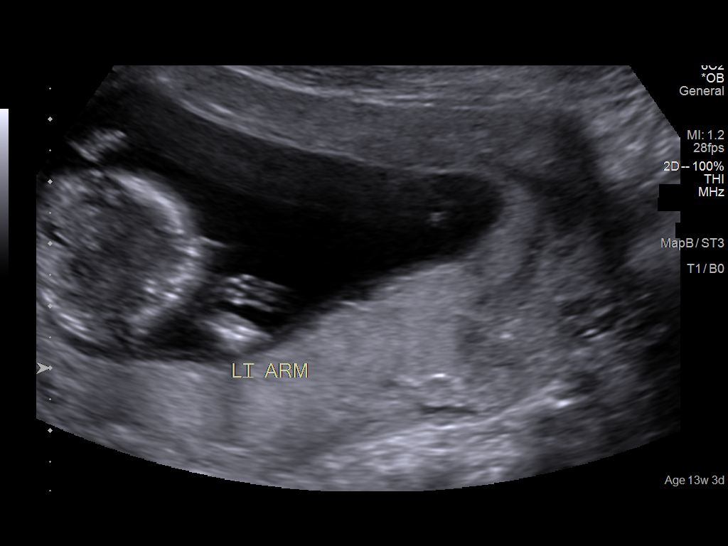
[im 9/47]
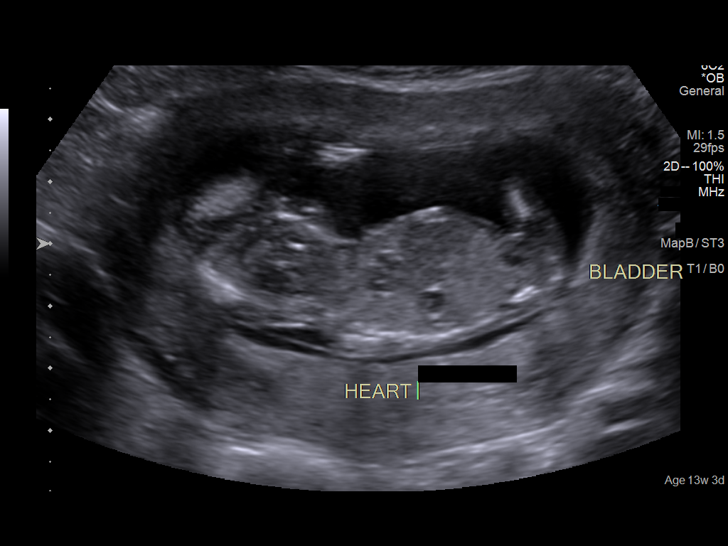
[im 12/47]
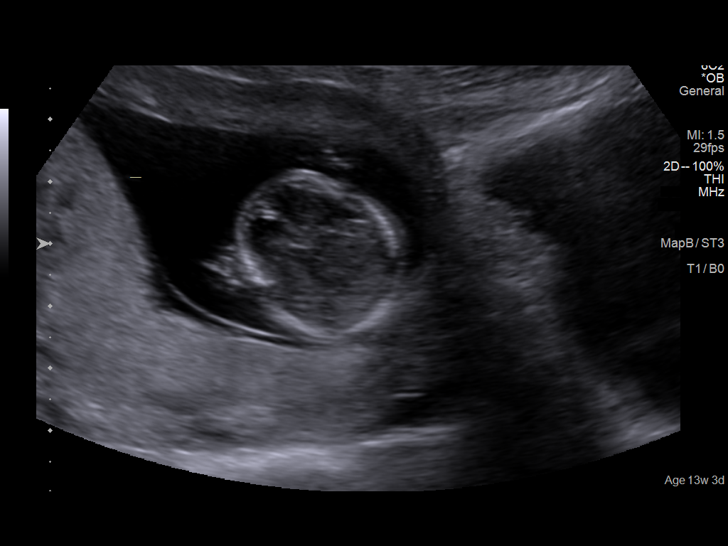
[im 16/47]
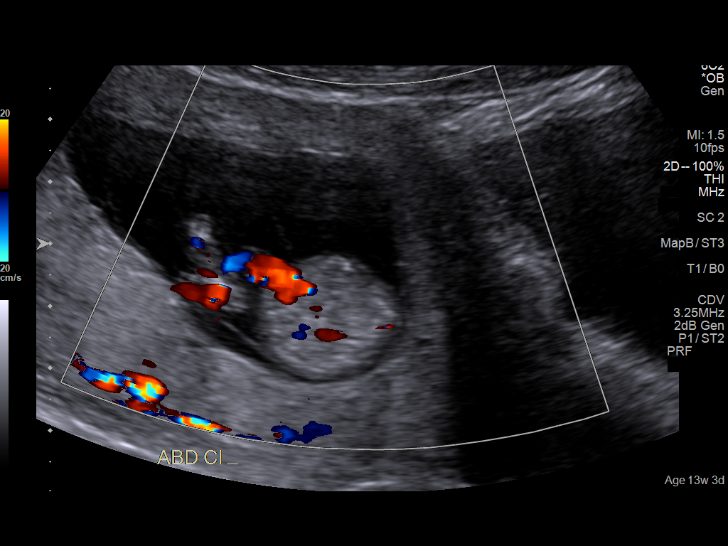
[im 19/47]
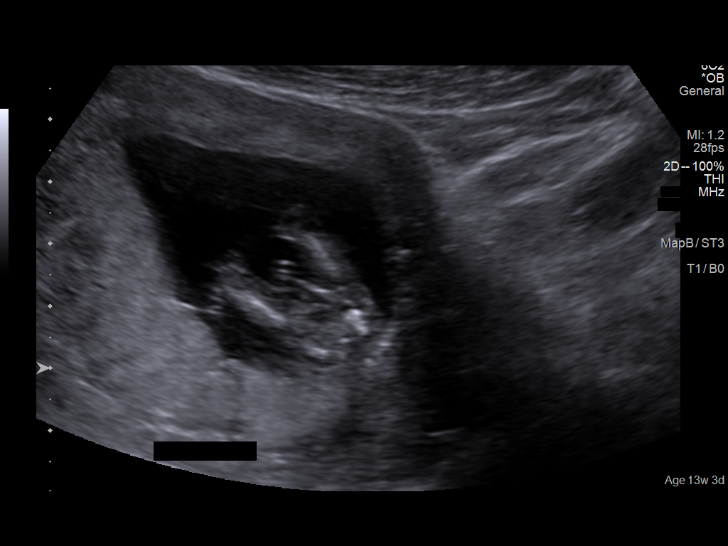
[im 24/47]
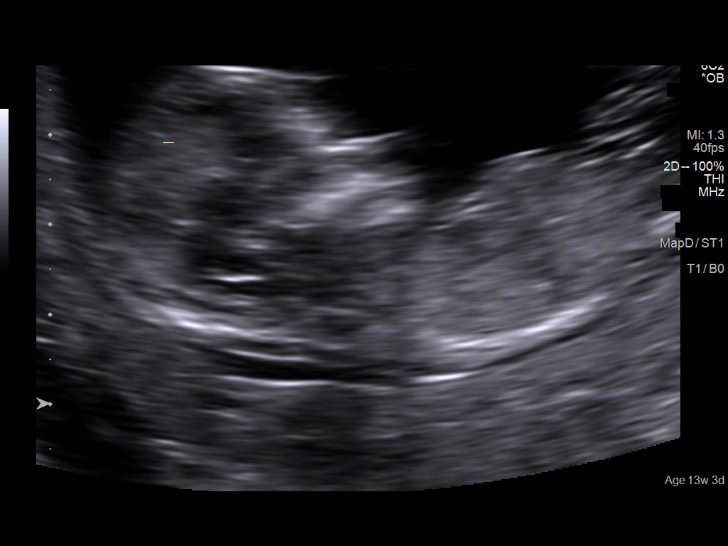
[im 28/47]
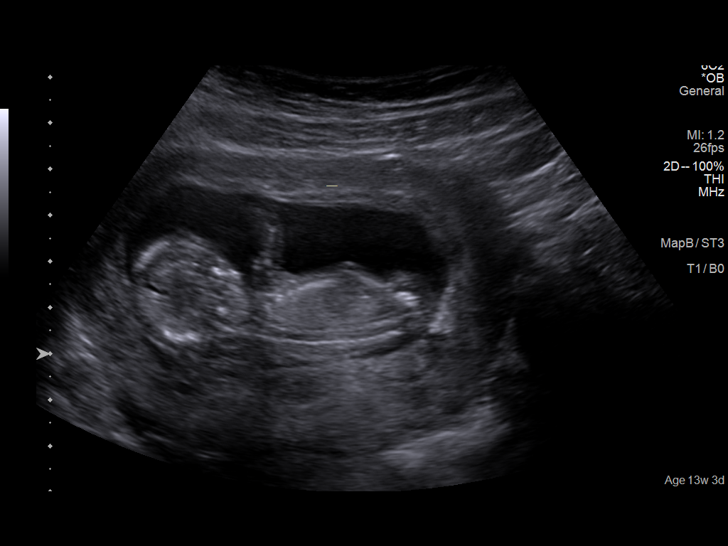
[im 31/47]
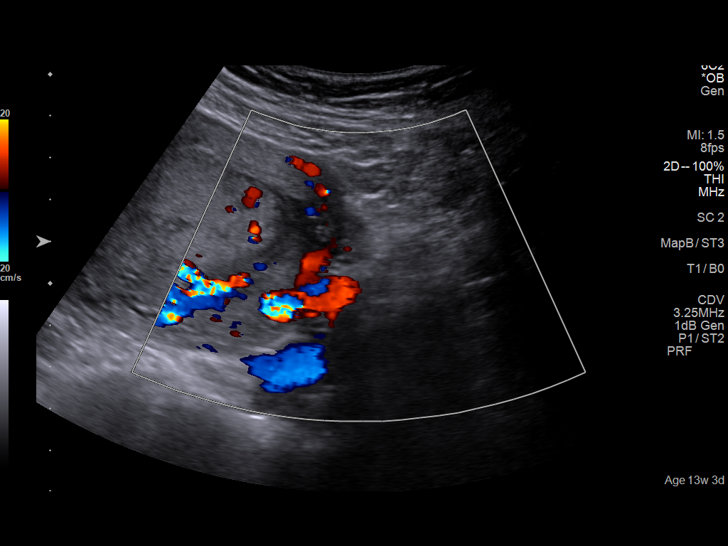
[im 35/47]
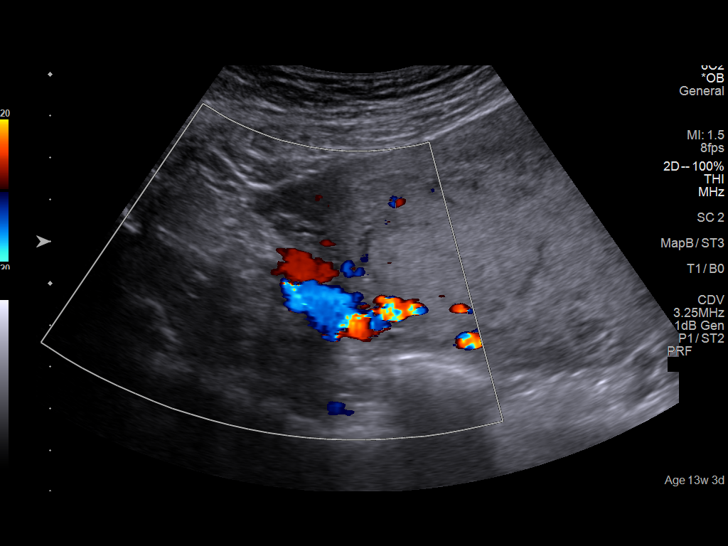
[im 38/47]
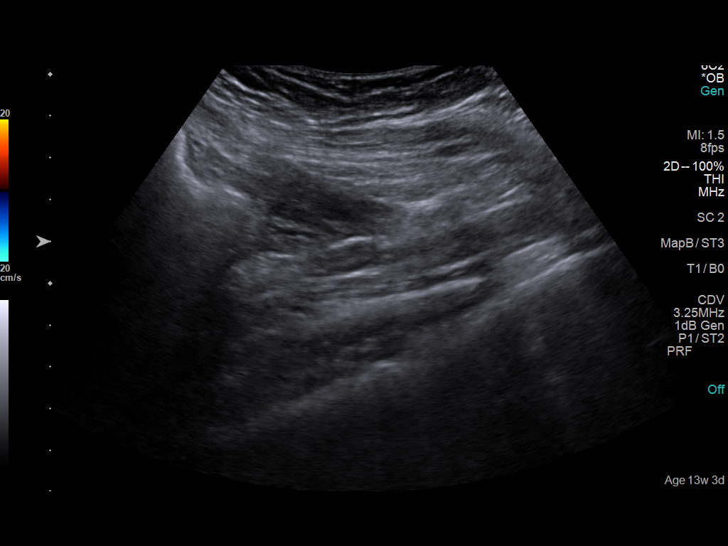
[im 41/47]
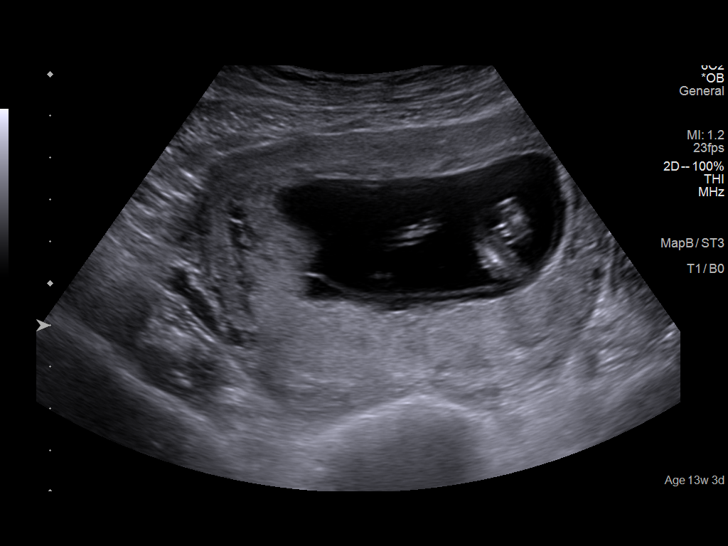
[im 45/47]
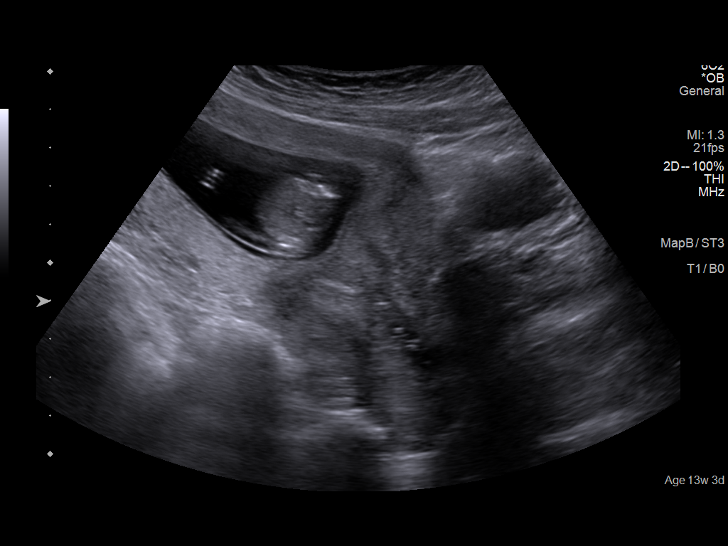

[13 of 28 positions shown; findings below may reference images not displayed]

IMPRESSION: Dear Ms. NILS,

 Thank you for referring your patient  for the first trimester
 nuchal translucency screening program.

 A singleton gestation is noted.

 The fetal biometry correlates with established dating.
 The patient is at 13w 4d with EDC of  01/30/2019 by a known
 LMP (consistent with her CRL of 13w 0d today)

 The nuchal area appears unremarkable . Other first trimester
 anatomy is within normal limits .

 The fetal nasal bone was noted to be present.

 The ovaries were WNL.

 The patient was scheduled to have blood drawn for cell free
 dna .
 F/u in 5-6 weeks for anatomy scan

 Thank you for allowing us to participate in your patient's care.

 assistance.

## 2018-07-28 NOTE — Telephone Encounter (Signed)
Jeanne [name] was seen for an ultrasound today at [facility] and was to have labs drawn for MaterniT21 testing (as desired after her prior genetic counseling visit).  However, she left the clinic prior to having the blood drawn.  She will return to our clinic on Thursday, 6/4 at 10am for the lab only portion of the visit.  If there are any questions or concerns, we may be reached at [phone].  Cherly Anderson, MS, CGC

## 2018-07-31 ENCOUNTER — Other Ambulatory Visit
Admission: RE | Admit: 2018-07-31 | Discharge: 2018-07-31 | Disposition: A | Discharge status: Home / Self Care | Payer: Medicaid Other | Source: Ambulatory Visit | Attending: Obstetrics and Gynecology | Admitting: Obstetrics and Gynecology

## 2018-07-31 DIAGNOSIS — Z3689 Encounter for other specified antenatal screening: Secondary | ICD-10-CM | POA: Insufficient documentation

## 2018-08-04 NOTE — Progress Notes (Signed)
History reviewed. Agree with history as outlined in CGC Well's note.   

## 2018-08-05 LAB — MATERNIT21 PLUS CORE+SCA
Fetal Fraction: 7
Monosomy X (Turner Syndrome): NOT DETECTED
Result (T21): NEGATIVE
Trisomy 13 (Patau syndrome): NEGATIVE
Trisomy 18 (Edwards syndrome): NEGATIVE
Trisomy 21 (Down syndrome): NEGATIVE
XXX (Triple X Syndrome): NOT DETECTED
XXY (Klinefelter Syndrome): NOT DETECTED
XYY (Jacobs Syndrome): NOT DETECTED

## 2018-08-07 ENCOUNTER — Telehealth: Payer: Self-pay | Admitting: Obstetrics and Gynecology

## 2018-08-07 NOTE — Telephone Encounter (Signed)
The patient was informed of the results of her recent MaterniT21 testing which yielded NEGATIVE results.  The patient's specimen showed DNA consistent with two copies of chromosomes 21, 18 and 13.  The sensitivity for trisomy 64, trisomy 50 and trisomy 60 using this testing are reported as 99.1%, 99.9% and 91.7% respectively.  Thus, while the results of this testing are highly accurate, they are not considered diagnostic at this time.  Should more definitive information be desired, the patient may still consider amniocentesis.   As requested to know by the patient, sex chromosome analysis was included for this sample.  Results will be given to the patient's sister by phone per her request. This is predicted with >99% accuracy.  A maternal serum AFP only should be considered if screening for neural tube defects is desired.  We may be reached at [phone] with any questions or concerns.   Wilburt Finlay, MS, CGC

## 2018-09-04 ENCOUNTER — Other Ambulatory Visit: Payer: Self-pay

## 2018-09-04 DIAGNOSIS — O09299 Supervision of pregnancy with other poor reproductive or obstetric history, unspecified trimester: Secondary | ICD-10-CM

## 2018-09-08 ENCOUNTER — Other Ambulatory Visit: Payer: Self-pay

## 2018-09-08 ENCOUNTER — Inpatient Hospital Stay
Admission: RE | Admit: 2018-09-08 | Discharge: 2018-09-08 | Disposition: A | Discharge status: Home / Self Care | Payer: BLUE CROSS/BLUE SHIELD | Source: Ambulatory Visit

## 2018-09-09 ENCOUNTER — Encounter: Payer: Self-pay | Admitting: Family Medicine

## 2018-09-09 ENCOUNTER — Ambulatory Visit: Payer: Medicaid Other | Admitting: Nurse Practitioner

## 2018-09-09 ENCOUNTER — Other Ambulatory Visit: Payer: Self-pay

## 2018-09-09 VITALS — BP 91/51 | Temp 98.0°F | Wt 133.6 lb

## 2018-09-09 DIAGNOSIS — Z3480 Encounter for supervision of other normal pregnancy, unspecified trimester: Secondary | ICD-10-CM

## 2018-09-09 DIAGNOSIS — F4320 Adjustment disorder, unspecified: Secondary | ICD-10-CM | POA: Insufficient documentation

## 2018-09-09 DIAGNOSIS — Z8759 Personal history of other complications of pregnancy, childbirth and the puerperium: Secondary | ICD-10-CM | POA: Insufficient documentation

## 2018-09-09 DIAGNOSIS — O09299 Supervision of pregnancy with other poor reproductive or obstetric history, unspecified trimester: Secondary | ICD-10-CM | POA: Insufficient documentation

## 2018-09-09 NOTE — Progress Notes (Deleted)
    STI clinic/screening visit  Subjective:  Jeanne [name] is a 28 y.o. female being seen today for an STI screening visit. The patient reports they {Actions; do/do not:19616} have symptoms.  Patient has the following medical conditionshas Supervision of other normal pregnancy, antepartum; History of postpartum hemorrhage; Hx of preeclampsia, prior pregnancy, currently pregnant; and Adjustment disorder on their problem list.  Chief Complaint  Patient presents with  . Routine Prenatal Visit    19.4 weeks    Patient reports  HPI   See flowsheet for further details and programmatic requirements.    The following portions of the patient's history were reviewed and updated as appropriate: allergies, current medications, past family history, past medical history, past social history, past surgical history and problem list. Problem list updated.  Objective:   Vitals:   09/09/18 1617  BP: (!) 91/51  Temp: 98 F (36.7 C)  Weight: 133 lb 9.6 oz (60.6 kg)    Physical Exam    Assessment and Plan:  Jeanne [name] is a 28 y.o. female presenting to the [facility] for STI screening  There are no diagnoses linked to this encounter.    No follow-ups on file.  Future Appointments  Date Time Provider [place]  09/11/2018  2:00 PM [facility] [place] [facility]    Berniece Andreas, NP

## 2018-09-09 NOTE — Progress Notes (Signed)
Denies s/s or exposure to Covid-19. Taking PNV QD, denies ED/hospital visits since last RV. DP Anatomy scan rescheduled for 09/11/2018 @ 2:00. To reschedule missed appt with Estill Bamberg. Hal Morales, RN

## 2018-09-10 NOTE — Progress Notes (Signed)
    PRENATAL VISIT NOTE  Subjective:  Jeanne [name] is a 28 y.o. [phone] at [redacted]w[redacted]d being seen today for ongoing prenatal care.  She is currently monitored for the following issues for this low-risk pregnancy and has Supervision of other normal pregnancy, antepartum; History of postpartum hemorrhage; Hx of preeclampsia, prior pregnancy, currently pregnant; and Adjustment disorder on their problem list.  Patient reports no complaints.   .  .   . denies vomiting, abdominal pain, fussiness, diarrhea, cough and difficulty breathing leaking of fluid/ROM.   The following portions of the patient's history were reviewed and updated as appropriate: allergies, current medications, past family history, past medical history, past social history, past surgical history and problem list. Problem list updated.  Objective:   Vitals:   09/09/18 1617  BP: (!) 91/51  Temp: 98 F (36.7 C)  Weight: 133 lb 9.6 oz (60.6 kg)    Fetal Status:           General:  Alert, oriented and cooperative. Patient is in no acute distress.  Skin: Skin is warm and dry. No rash noted.   Cardiovascular: Normal heart rate noted  Respiratory: Normal respiratory effort, no problems with respiration noted  Abdomen: Soft, gravid, appropriate for gestational age.        Pelvic: Cervical exam deferred        Extremities: Normal range of motion.     Mental Status: Normal mood and affect. Normal behavior. Normal judgment and thought content.   Assessment and Plan:  Pregnancy: [id number] at [redacted]w[redacted]d  There are no diagnoses linked to this encounter.  Preterm labor symptoms and general obstetric precautions including but not limited to vaginal bleeding, contractions, leaking of fluid and fetal movement were reviewed in detail with the patient. Please refer to After Visit Summary for other counseling recommendations.  No follow-ups on file.  Future Appointments  Date Time Provider [place]  09/11/2018  2:00 PM  [facility] [place] [facility]  10/07/2018  3:00 PM [facility] PROVIDER [facility] None    Berniece Andreas, NP

## 2018-09-11 ENCOUNTER — Ambulatory Visit
Admission: RE | Admit: 2018-09-11 | Discharge: 2018-09-11 | Disposition: A | Discharge status: Home / Self Care | Payer: Medicaid Other | Source: Ambulatory Visit | Attending: Maternal & Fetal Medicine | Admitting: Maternal & Fetal Medicine

## 2018-09-11 ENCOUNTER — Other Ambulatory Visit: Payer: Self-pay

## 2018-09-11 DIAGNOSIS — O09292 Supervision of pregnancy with other poor reproductive or obstetric history, second trimester: Secondary | ICD-10-CM | POA: Diagnosis not present

## 2018-09-11 DIAGNOSIS — Z3480 Encounter for supervision of other normal pregnancy, unspecified trimester: Secondary | ICD-10-CM

## 2018-09-11 DIAGNOSIS — Z3A19 19 weeks gestation of pregnancy: Secondary | ICD-10-CM | POA: Diagnosis not present

## 2018-09-11 DIAGNOSIS — Z8759 Personal history of other complications of pregnancy, childbirth and the puerperium: Secondary | ICD-10-CM

## 2018-09-11 DIAGNOSIS — O09299 Supervision of pregnancy with other poor reproductive or obstetric history, unspecified trimester: Secondary | ICD-10-CM

## 2018-09-11 IMAGING — US US MFM OB DETAIL +14 WK
1 series · 12 of 28 positions shown · non-contrast
Comparison: none

PATIENT INFO:

             CUCIUC
PERFORMED BY:
                   Sonographer
                   NORD CNM
SERVICE(S) PROVIDED:
 ----------------------------------------------------------------------
INDICATIONS:
  19 weeks gestation of pregnancy
FETAL EVALUATION:
 Num Of Fetuses:         1
 Fetal Heart Rate(bpm):  133
 Presentation:           Breech
 Placenta:               Posterior
 P. Cord Insertion:      Normal
                             Largest Pocket(cm)
BIOMETRY:
 BPD:      44.8  mm     G. Age:  19w 4d         36  %    CI:        72.09   %    70 - 86
                                                         FL/HC:      18.4   %    16.8 -
 HC:      167.9  mm     G. Age:  19w 3d         24  %
                                                         FL/BPD:     69.0   %
 FL:       30.9  mm     G. Age:  19w 4d         33  %
 HUM:      29.3  mm     G. Age:  19w 4d         45  %
 CM:        2.7  mm
GESTATIONAL AGE:
 LMP:           19w 6d        Date:  04/25/18                 EDD:   01/30/19
 U/S Today:     19w 4d                                        EDD:   02/01/19
 Best:          19w 6d     Det. By:  LMP  (04/25/18)          EDD:   01/30/19
ANATOMY:
 Cavum:                 CSP visualized         Aortic Arch:            Limited view
 Ventricles:            Normal appearance      Ductal Arch:            Normal appearance
 Choroid Plexus:        Within Normal Limits   Diaphragm:              Within Normal Limits
 Cerebellum:            Within Normal Limits   Stomach:                Seen
 Posterior Fossa:       Within Normal Limits   Abdomen:                Within Normal
                                                                       Limits
 Nuchal Fold:           Within Normal Limits   Abdominal Wall:         Normal appearance
 Face:                  Orbits visualized      Cord Vessels:           3 vessels
 Lips:                  Normal appearance      Kidneys:                Normal appearance
 Thoracic:              Within Normal Limits   Bladder:                Seen
 Heart:                 4-Chamber view         Spine:                  Grossly normal
                        appears normal
 RVOT:                  Normal appearance      Upper Extremities:      Visualized
 LVOT:                  Normal appearance      Lower Extremities:      Visualized
CERVIX UTERUS ADNEXA:
 Cervix
 Length:           5.74  cm.
 Right Ovary
 Size(cm)       2.7  x   1.5    x  1.8       Vol(ml):

[Series 1: us mfm ob detail +14 wk · 0.25mm/px · 12 of 92 slices shown]
[im 4/92]
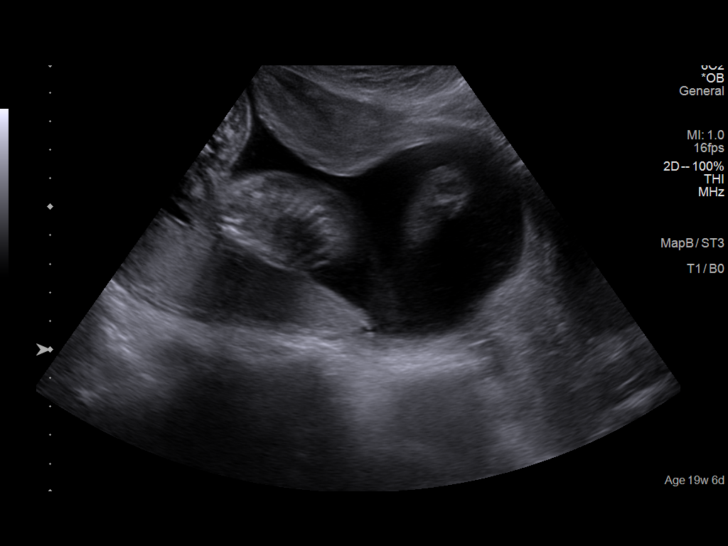
[im 11/92]
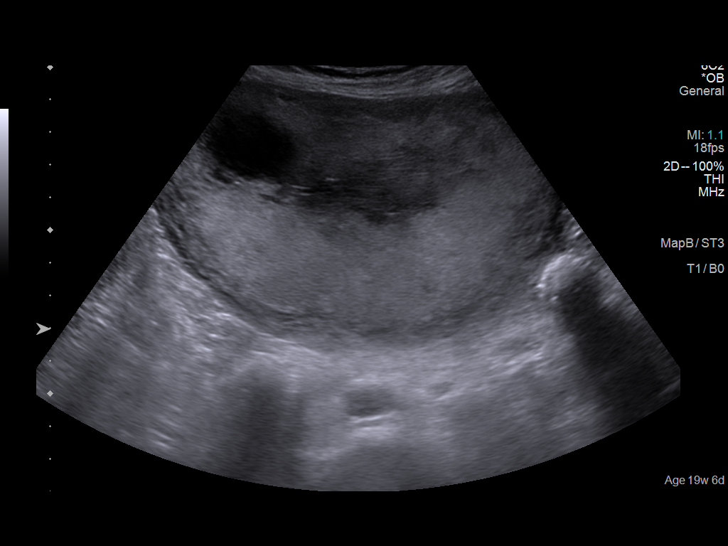
[im 17/92]
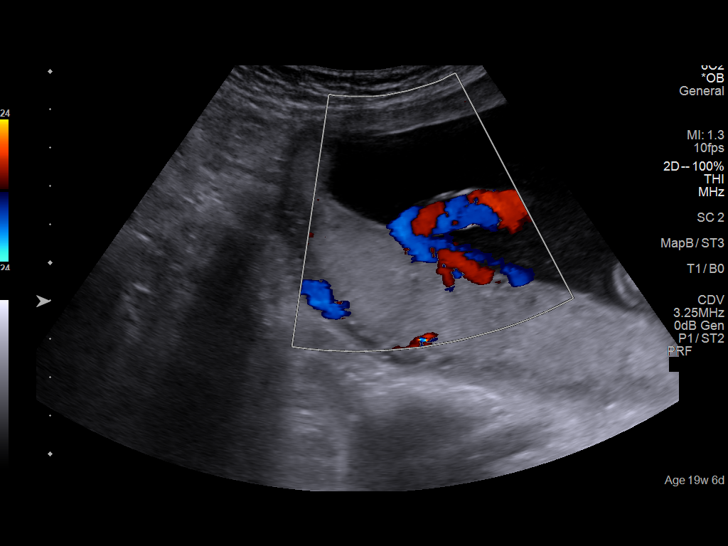
[im 27/92]
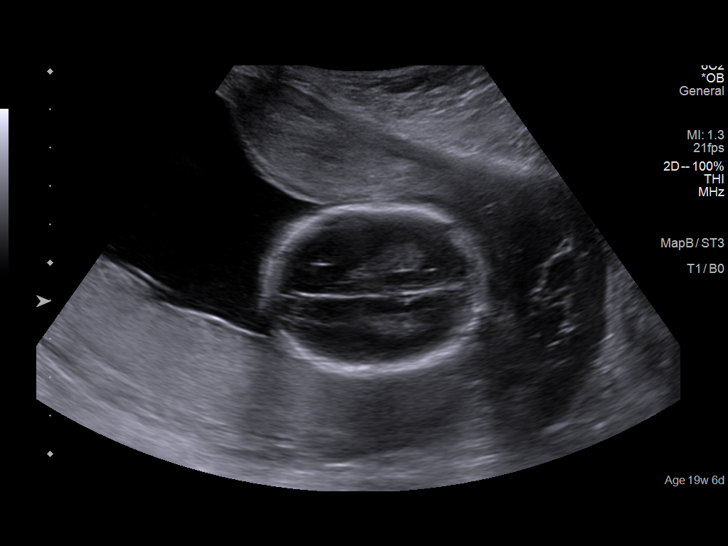
[im 34/92]
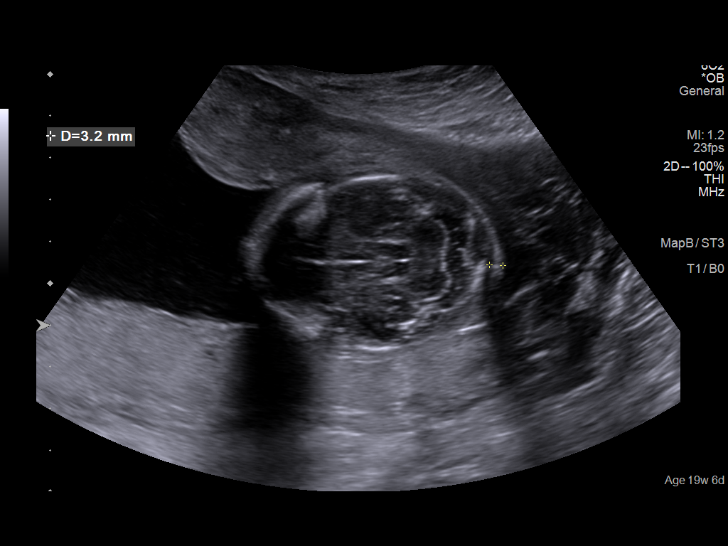
[im 41/92]
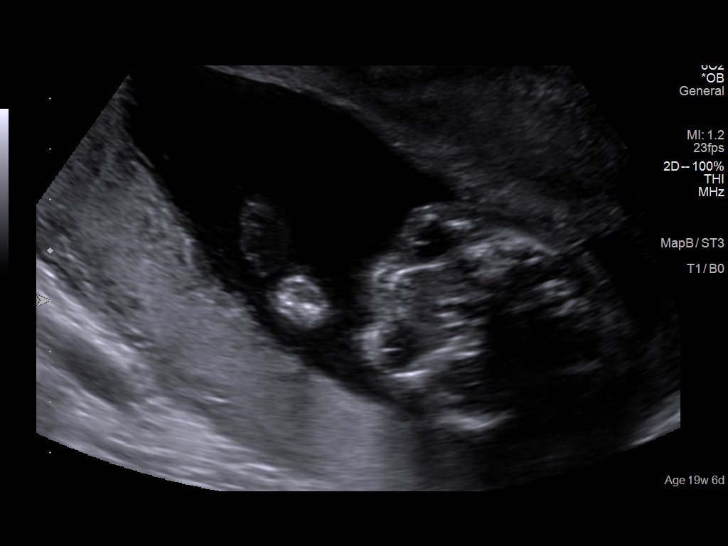
[im 51/92]
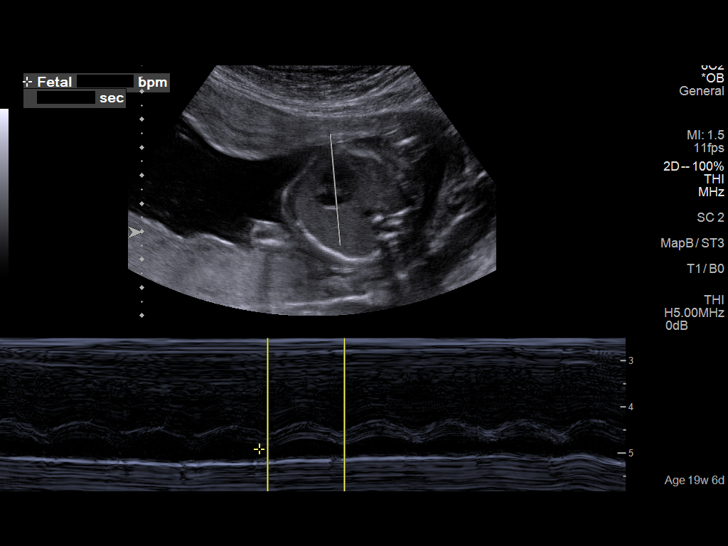
[im 58/92]
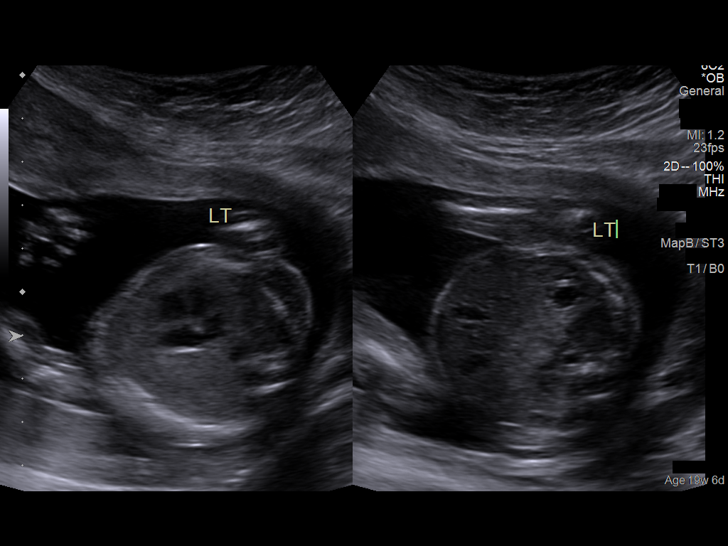
[im 65/92]
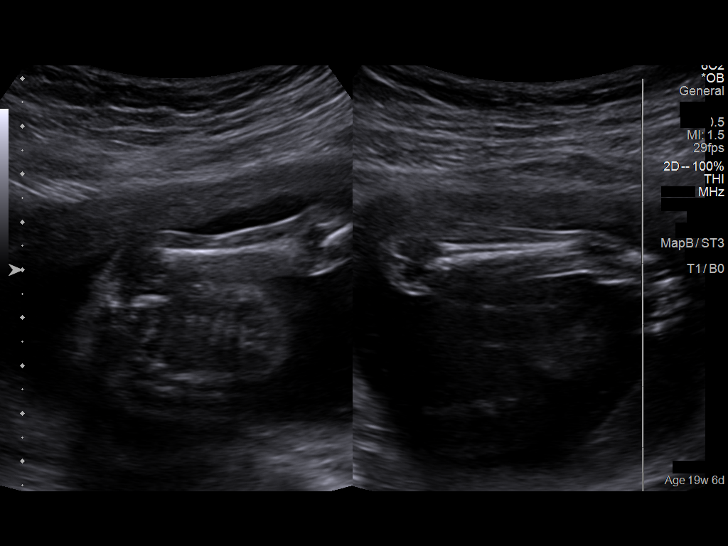
[im 75/92]
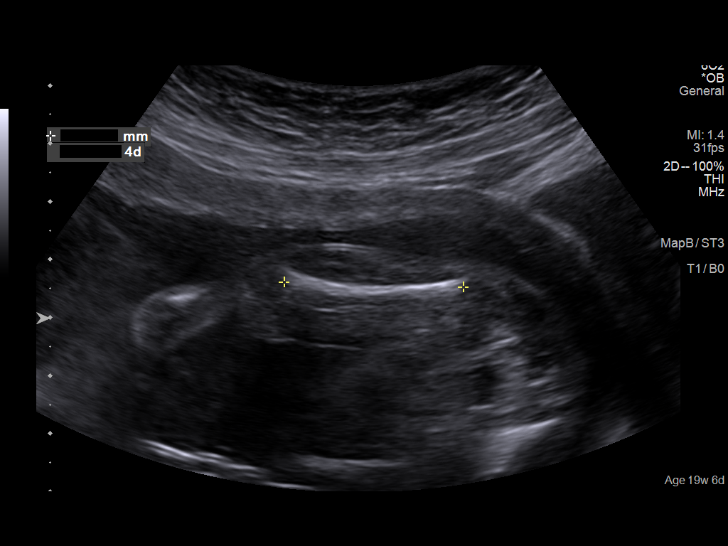
[im 81/92]
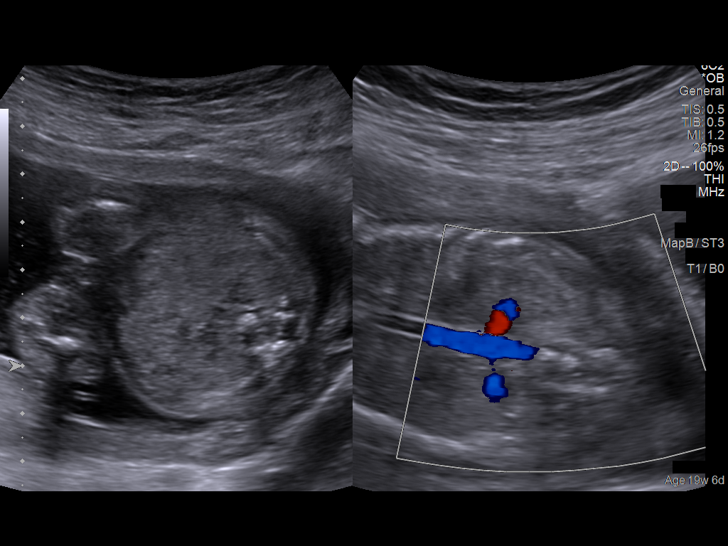
[im 88/92]
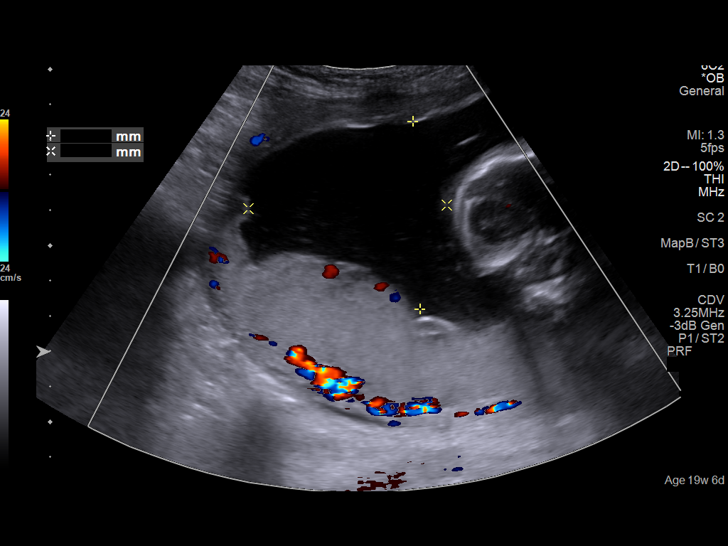

[12 of 28 positions shown; findings below may reference images not displayed]

IMPRESSION: Thank you for referring your patient  for a fetal anatomical
 survey.

 There is a singleton gestation at 19 weeks 6 days with
 subjectively normal amniotic fluid volume.  Dating is by LMP
 consistent with earliest available ultrasound performed at
 [facility] [HOS[facility]] on 07/28/18; gestational age was 13
 weeks 0 days.

 The fetal biometry correlates with established dating.

 The fetal anatomical survey appears within normal limits
 within the resolution of ultrasound as described above.  The
 fetus remained spine down, however, sping images were
 grossly normal.  The amniotic fluid volume is normal and
 active fetal movements are seen.

 Thank you for allowing us to participate in your patient's care.

 assistance.

                  Serrano, Shin

## 2018-09-11 NOTE — ED Notes (Signed)
Pt here for follow up ultrasound.

## 2018-10-07 ENCOUNTER — Ambulatory Visit: Payer: Medicaid Other | Admitting: Nurse Practitioner

## 2018-10-07 ENCOUNTER — Other Ambulatory Visit: Payer: Self-pay

## 2018-10-07 DIAGNOSIS — O09299 Supervision of pregnancy with other poor reproductive or obstetric history, unspecified trimester: Secondary | ICD-10-CM

## 2018-10-07 DIAGNOSIS — Z862 Personal history of diseases of the blood and blood-forming organs and certain disorders involving the immune mechanism: Secondary | ICD-10-CM

## 2018-10-07 DIAGNOSIS — Z8759 Personal history of other complications of pregnancy, childbirth and the puerperium: Secondary | ICD-10-CM

## 2018-10-07 DIAGNOSIS — Z3480 Encounter for supervision of other normal pregnancy, unspecified trimester: Secondary | ICD-10-CM

## 2018-10-07 NOTE — Progress Notes (Addendum)
   TELEPHONE OBSTETRICS VISIT ENCOUNTER NOTE  I connected with Ernst Bowler on 10/07/2018 at  3:00 PM EDT by telephone at home and verified that I am speaking with the correct person using two identifiers.   I discussed the limitations, risks, security and privacy concerns of performing an evaluation and management service by telephone and the availability of in person appointments. I also discussed with the patient that there may be a patient responsible charge related to this service. The patient expressed understanding and agreed to proceed.  Subjective:  Jeanne [name] is a 28 y.o. [phone] at [redacted]w[redacted]d being followed for ongoing prenatal care.  She is currently monitored for the following issues for this low-risk pregnancy and has Supervision of other normal pregnancy, antepartum; History of postpartum hemorrhage; Hx of preeclampsia, prior pregnancy, currently pregnant; and Adjustment disorder on their problem list.  Patient reports no complaints. Reports fetal movement. Denies any contractions, bleeding or leaking of fluid.   The following portions of the patient's history were reviewed and updated as appropriate: allergies, current medications, past family history, past medical history, past social history, past surgical history and problem list.   Objective:   General:  Alert, oriented and cooperative.   Mental Status: Normal mood and affect perceived. Normal judgment and thought content.  Rest of physical exam deferred due to type of encounter  Assessment and Plan:  Pregnancy: [id number] at [redacted]w[redacted]d 1. Supervision of other normal pregnancy, antepartum Client doing well. Denies any additional questions or concerns at this time ROS OB questions asked Client denies headaches and vaginal discharge Plan for client to call [facility] for services and to reschedule previous appointment.   2. Hx of preeclampsia, prior pregnancy, currently pregnant Continue to monitor during  next in-house MV. Client verbalizes understanding and is in agreement with plan of care.     Preterm labor symptoms and general obstetric precautions including but not limited to vaginal bleeding, contractions, leaking of fluid and fetal movement were reviewed in detail with the patient.  I discussed the assessment and treatment plan with the patient. The patient was provided an opportunity to ask questions and all were answered. The patient agreed with the plan and demonstrated an understanding of the instructions. The patient was advised to call back or seek an in-person office evaluation/go to the hospital for any urgent or concerning symptoms.  Please refer to After Visit Summary for other counseling recommendations.   I provided 5 minutes of non-face-to-face time during this encounter.  No follow-ups on file.  Future Appointments  Date Time Provider [place]  11/05/2018  3:00 PM [facility] PROVIDER [facility] None    Berniece Andreas, NP

## 2018-10-07 NOTE — Progress Notes (Signed)
TC to patient for maternity telehealth appointment. Patient identified using 2 identifiers. Patient states she is at home and feels comfortable to talk at this time. S/S PTL reviewed and patient will need hard copy at next appointment. Patient scheduled for 4 week RV for 28 week labs. Patient states she will be available by phone for provider call for next 30-45 minutes.Jenetta Downer, RN

## 2018-10-17 ENCOUNTER — Other Ambulatory Visit: Payer: Self-pay

## 2018-10-17 ENCOUNTER — Telehealth: Payer: Self-pay | Admitting: General Practice

## 2018-10-17 DIAGNOSIS — Z20822 Contact with and (suspected) exposure to covid-19: Secondary | ICD-10-CM

## 2018-10-17 NOTE — Telephone Encounter (Signed)
Returned client's call--reports boyfriend had +Covid test and she was tested yesterday, results pending; aware to try to avoid contact with him, frequent hand washing, masks, etc.; pt. Is asymptomatic currently and aware to try to isolate self; to treat symptoms if develops Debera Lat, RN

## 2018-10-17 NOTE — Telephone Encounter (Signed)
tested postitve for covid. would like to know what she can take due to being pregnant to supress symptoms

## 2018-10-18 LAB — NOVEL CORONAVIRUS, NAA: SARS-CoV-2, NAA: DETECTED — AB

## 2018-10-22 ENCOUNTER — Telehealth: Payer: Self-pay | Admitting: Family Medicine

## 2018-10-22 NOTE — Telephone Encounter (Signed)
Patient got tested Friday 8/21 and she came out positive. Patient wants to know what kind of medication she can take for the fever.

## 2018-10-22 NOTE — Telephone Encounter (Signed)
TC to patient. Patient advised to take Tylenol for fever. Patient reminded of "safe medications in pregnancy" sheet in new OB bag, and patient states she thinks she still has that. Patient informed that there are medications for coughs on that sheet as well. Patient told that if she can't find her medications sheet to call back and we can talk more over the phone. Patient agrees with plan.Jenetta Downer, RN

## 2018-10-25 ENCOUNTER — Emergency Department: Payer: Medicaid Other

## 2018-10-25 ENCOUNTER — Inpatient Hospital Stay
Admission: EM | Admit: 2018-10-25 | Discharge: 2018-10-27 | DRG: 831 | Disposition: A | Discharge status: Home / Self Care | Payer: Medicaid Other | Attending: Internal Medicine | Admitting: Internal Medicine

## 2018-10-25 ENCOUNTER — Encounter: Payer: Self-pay | Admitting: Emergency Medicine

## 2018-10-25 ENCOUNTER — Other Ambulatory Visit: Payer: Self-pay

## 2018-10-25 DIAGNOSIS — O98512 Other viral diseases complicating pregnancy, second trimester: Secondary | ICD-10-CM | POA: Diagnosis present

## 2018-10-25 DIAGNOSIS — Z8616 Personal history of COVID-19: Secondary | ICD-10-CM

## 2018-10-25 DIAGNOSIS — R0902 Hypoxemia: Secondary | ICD-10-CM

## 2018-10-25 DIAGNOSIS — O99512 Diseases of the respiratory system complicating pregnancy, second trimester: Principal | ICD-10-CM | POA: Diagnosis present

## 2018-10-25 DIAGNOSIS — Z3A26 26 weeks gestation of pregnancy: Secondary | ICD-10-CM

## 2018-10-25 DIAGNOSIS — O99342 Other mental disorders complicating pregnancy, second trimester: Secondary | ICD-10-CM | POA: Diagnosis present

## 2018-10-25 DIAGNOSIS — F4320 Adjustment disorder, unspecified: Secondary | ICD-10-CM | POA: Diagnosis present

## 2018-10-25 DIAGNOSIS — U071 COVID-19: Secondary | ICD-10-CM

## 2018-10-25 DIAGNOSIS — R0602 Shortness of breath: Secondary | ICD-10-CM

## 2018-10-25 DIAGNOSIS — O99891 Other specified diseases and conditions complicating pregnancy: Secondary | ICD-10-CM

## 2018-10-25 DIAGNOSIS — J1289 Other viral pneumonia: Secondary | ICD-10-CM | POA: Diagnosis present

## 2018-10-25 LAB — BASIC METABOLIC PANEL
Anion gap: 9 (ref 5–15)
BUN: 9 mg/dL (ref 6–20)
CO2: 21 mmol/L — ABNORMAL LOW (ref 22–32)
Calcium: 8.9 mg/dL (ref 8.9–10.3)
Chloride: 105 mmol/L (ref 98–111)
Creatinine, Ser: 0.33 mg/dL — ABNORMAL LOW (ref 0.44–1.00)
GFR calc Af Amer: >60 mL/min (ref >60)
GFR calc non Af Amer: >60 mL/min (ref >60)
Glucose, Bld: 89 mg/dL (ref 70–99)
Potassium: 4.2 mmol/L (ref 3.5–5.1)
Sodium: 135 mmol/L (ref 135–145)

## 2018-10-25 LAB — CBC WITH DIFFERENTIAL/PLATELET
Abs Immature Granulocytes: 0.09 10*3/uL — ABNORMAL HIGH (ref 0.00–0.07)
Basophils Absolute: 0 10*3/uL (ref 0.0–0.1)
Basophils Relative: 0 %
Eosinophils Absolute: 0.1 10*3/uL (ref 0.0–0.5)
Eosinophils Relative: 2 %
HCT: 32.7 % — ABNORMAL LOW (ref 36.0–46.0)
Hemoglobin: 10.8 g/dL — ABNORMAL LOW (ref 12.0–15.0)
Immature Granulocytes: 2 %
Lymphocytes Relative: 29 %
Lymphs Abs: 1.2 10*3/uL (ref 0.7–4.0)
MCH: 29.1 pg (ref 26.0–34.0)
MCHC: 33 g/dL (ref 30.0–36.0)
MCV: 88.1 fL (ref 80.0–100.0)
Monocytes Absolute: 0.2 10*3/uL (ref 0.1–1.0)
Monocytes Relative: 6 %
Neutro Abs: 2.5 10*3/uL (ref 1.7–7.7)
Neutrophils Relative %: 61 %
Platelets: 259 10*3/uL (ref 150–400)
RBC: 3.71 MIL/uL — ABNORMAL LOW (ref 3.87–5.11)
RDW: 13.2 % (ref 11.5–15.5)
WBC: 4.1 10*3/uL (ref 4.0–10.5)
nRBC: 0 % (ref 0.0–0.2)

## 2018-10-25 LAB — FIBRIN DERIVATIVES D-DIMER (ARMC ONLY): Fibrin derivatives D-dimer (ARMC): 613.49 ng/mL (FEU) — ABNORMAL HIGH (ref 0.00–499.00)

## 2018-10-25 LAB — TROPONIN I (HIGH SENSITIVITY)
Troponin I (High Sensitivity): 2 ng/L (ref <18)
Troponin I (High Sensitivity): <2 ng/L (ref <18)

## 2018-10-25 IMAGING — CT CT ANGIOGRAPHY CHEST
2 of 6 series · 18 of 46 positions shown · IV contrast (APPLIED)
Comparison: Chest x-ray 10/25/2018

CLINICAL DATA: Complex chest pain

EXAM:
CT ANGIOGRAPHY CHEST WITH CONTRAST
TECHNIQUE: Multidetector CT imaging of the chest was performed using the
standard protocol during bolus administration of intravenous
contrast. Multiplanar CT image reconstructions and MIPs were
obtained to evaluate the vascular anatomy.
CONTRAST:  75mL OMNIPAQUE IOHEXOL 350 MG/ML SOLN

[Series 5: thins · axial · 0.64mm/px · z∈[-198,+7]mm · 16 of 225 slices shown]
[im 10/225  lung]
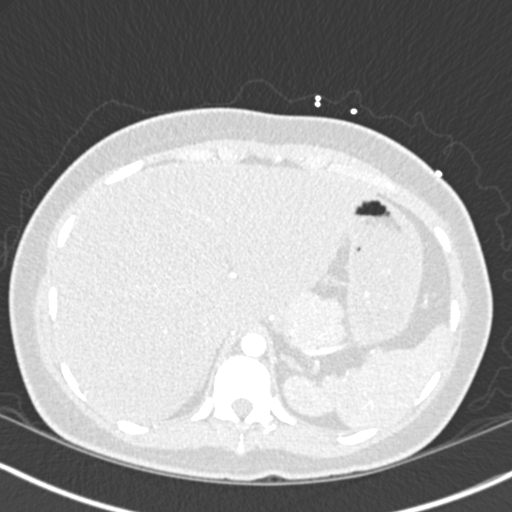
[im 30/225  soft-tissue]
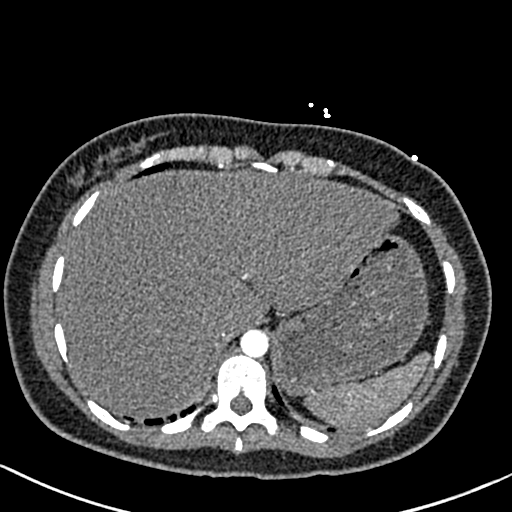
[im 39/225  lung]
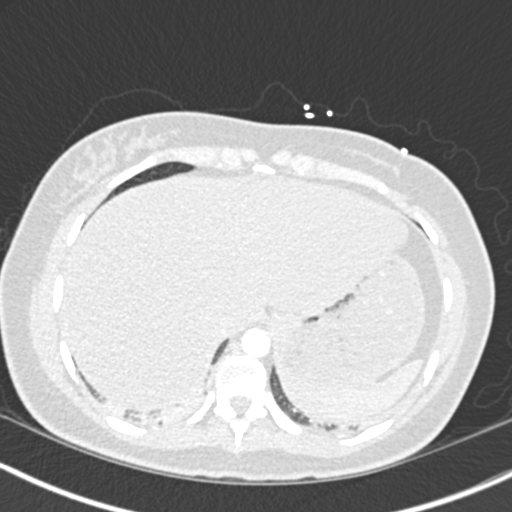
[im 49/225  soft-tissue]
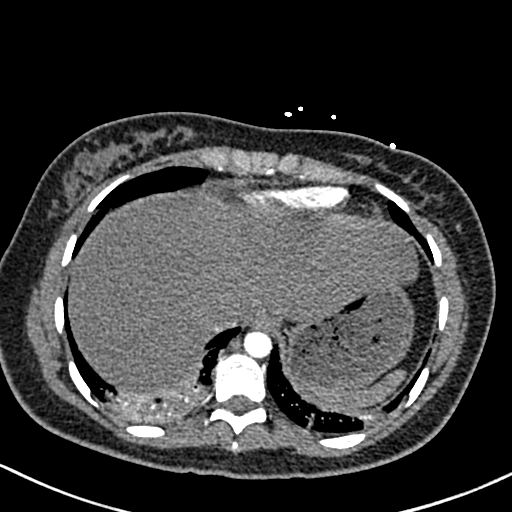
[im 69/225  lung]
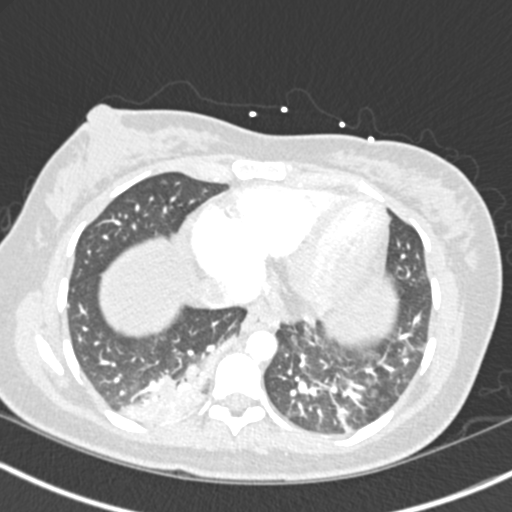
[im 78/225  soft-tissue]
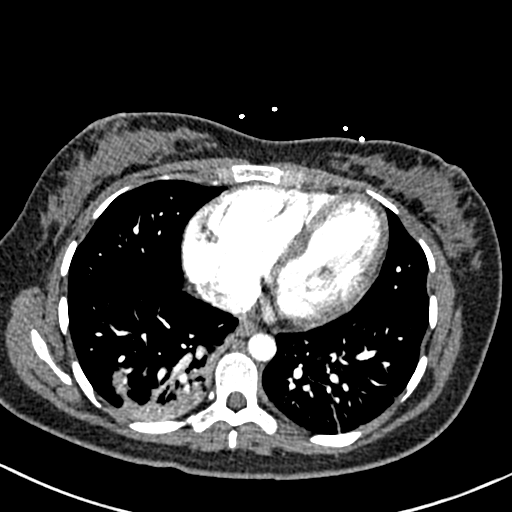
[im 88/225  lung]
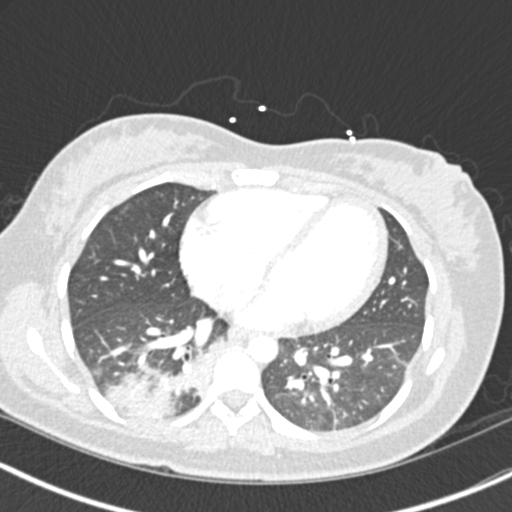
[im 108/225  soft-tissue]
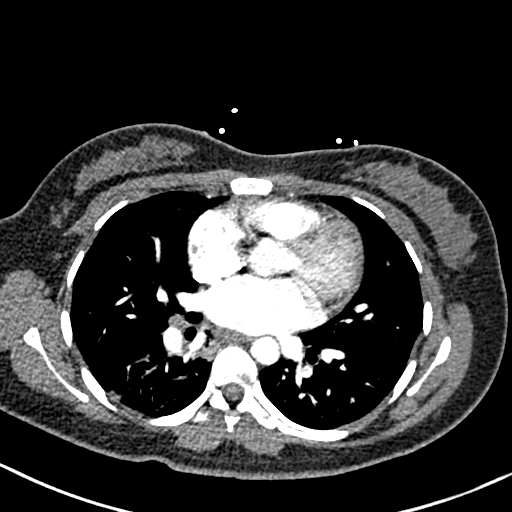
[im 117/225  lung]
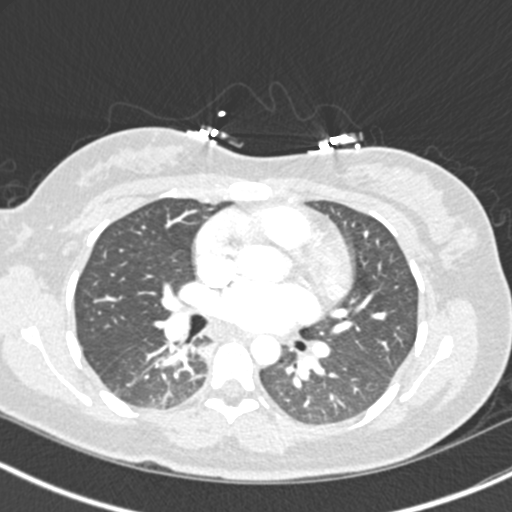
[im 137/225  soft-tissue]
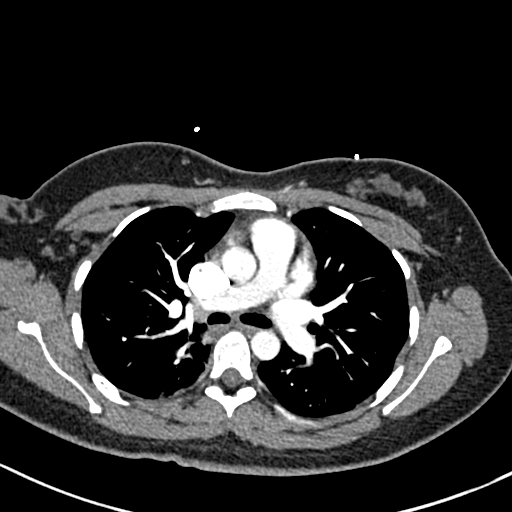
[im 147/225  lung]
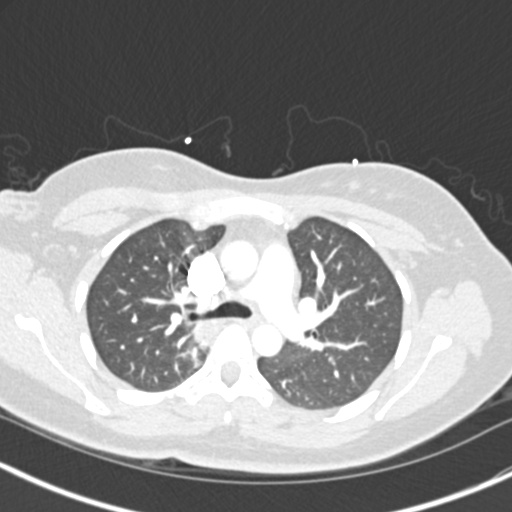
[im 156/225  soft-tissue]
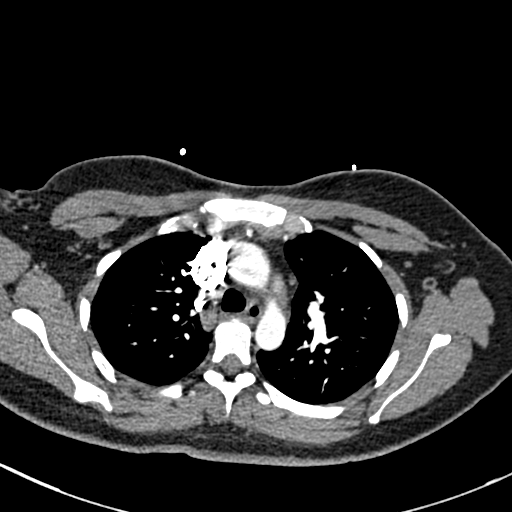
[im 176/225  lung]
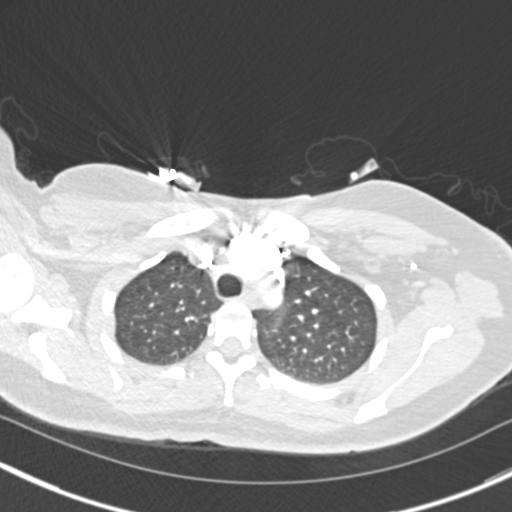
[im 186/225  soft-tissue]
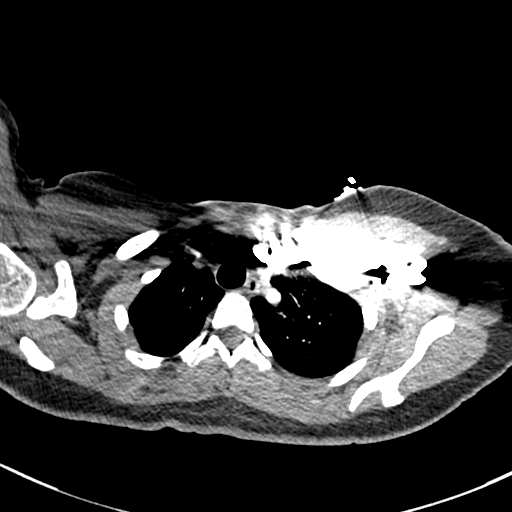
[im 195/225  lung]
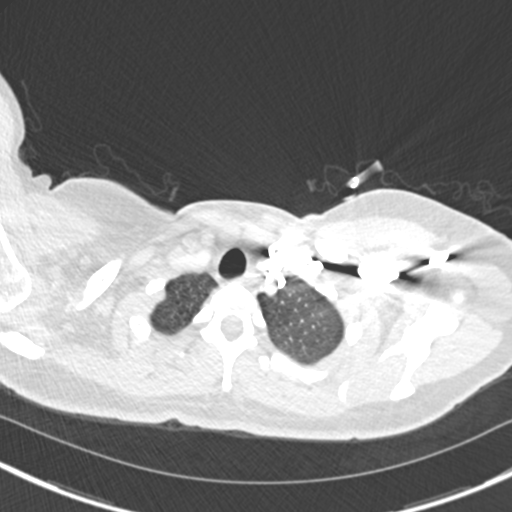
[im 215/225  soft-tissue]
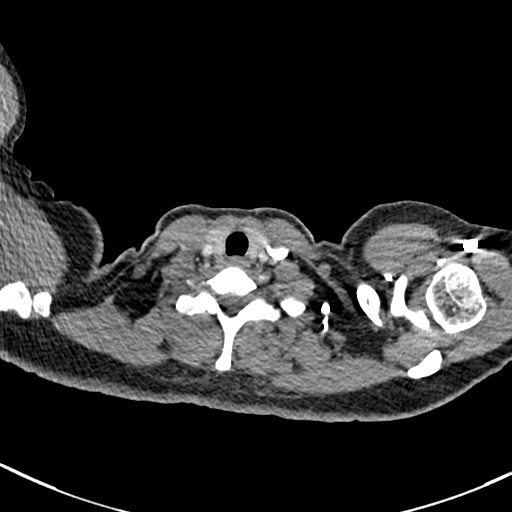

[Series 7: coronal mpr · coronal · 0.49mm/px · 2 of 68 slices shown]
[im 23/68  soft-tissue]
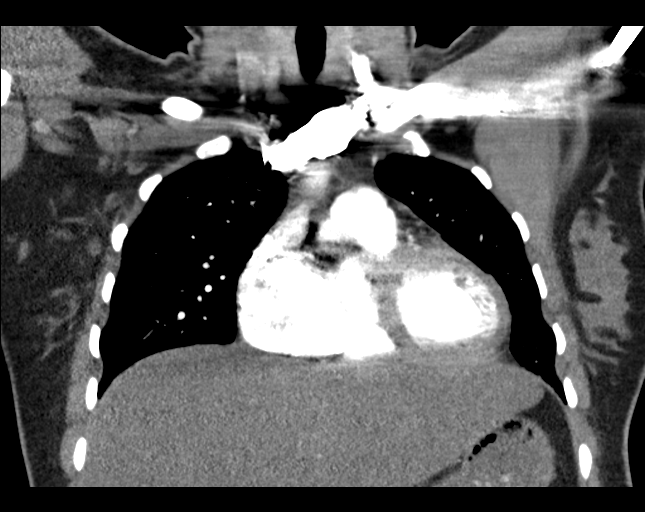
[im 45/68  soft-tissue]
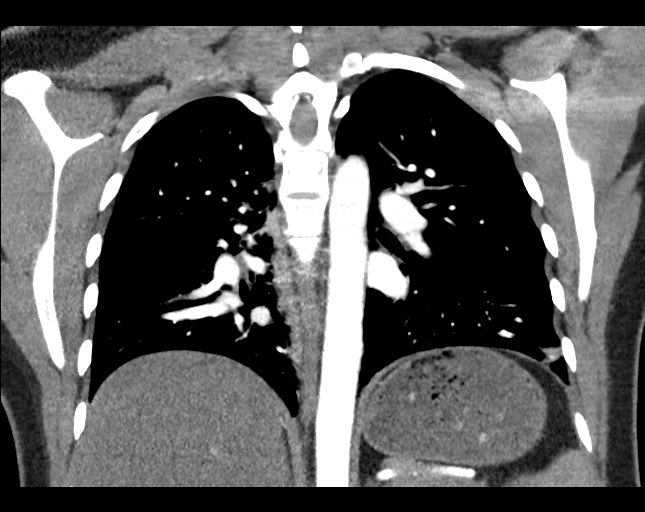

[18 of 46 positions shown; findings below may reference images not displayed]

FINDINGS: Cardiovascular: Satisfactory opacification of the pulmonary arteries
to the segmental level. No evidence of pulmonary embolism.
Nonaneurysmal aorta. No dissection seen. Heart size upper normal. No
pericardial effusion.

Mediastinum/Nodes: No enlarged mediastinal, hilar, or axillary lymph
nodes. Thyroid gland, trachea, and esophagus demonstrate no
significant findings.

Lungs/Pleura: Partial consolidation in the right lower lobe
consistent with a pneumonia. Small focus of airspace disease and
surrounding ground-glass density in the posterior right upper lobe.
Small focus of consolidation within the lateral aspect of left lower
lobe. No pleural effusion.

Upper Abdomen: Steatosis.  No acute abnormality

Musculoskeletal: No chest wall abnormality. No acute or significant
osseous findings.

Review of the MIP images confirms the above findings.
IMPRESSION: 1. Negative for acute pulmonary embolus or aortic dissection.
2. Partial consolidation within the right lower lobe with addition
consolidations and ground-glass density in the right upper lobe and
left lung base, consistent with a multifocal pneumonia.
3. Hepatic steatosis

## 2018-10-25 IMAGING — DX PORTABLE CHEST - 1 VIEW
1 series · 1 of 1 positions shown · non-contrast
Comparison: Chest radiograph date 07/28/2017

CLINICAL DATA: 27-year-old female with diagnosis of coli 19
presenting with shortness of breath.

EXAM:
PORTABLE CHEST 1 VIEW

[chest ap]
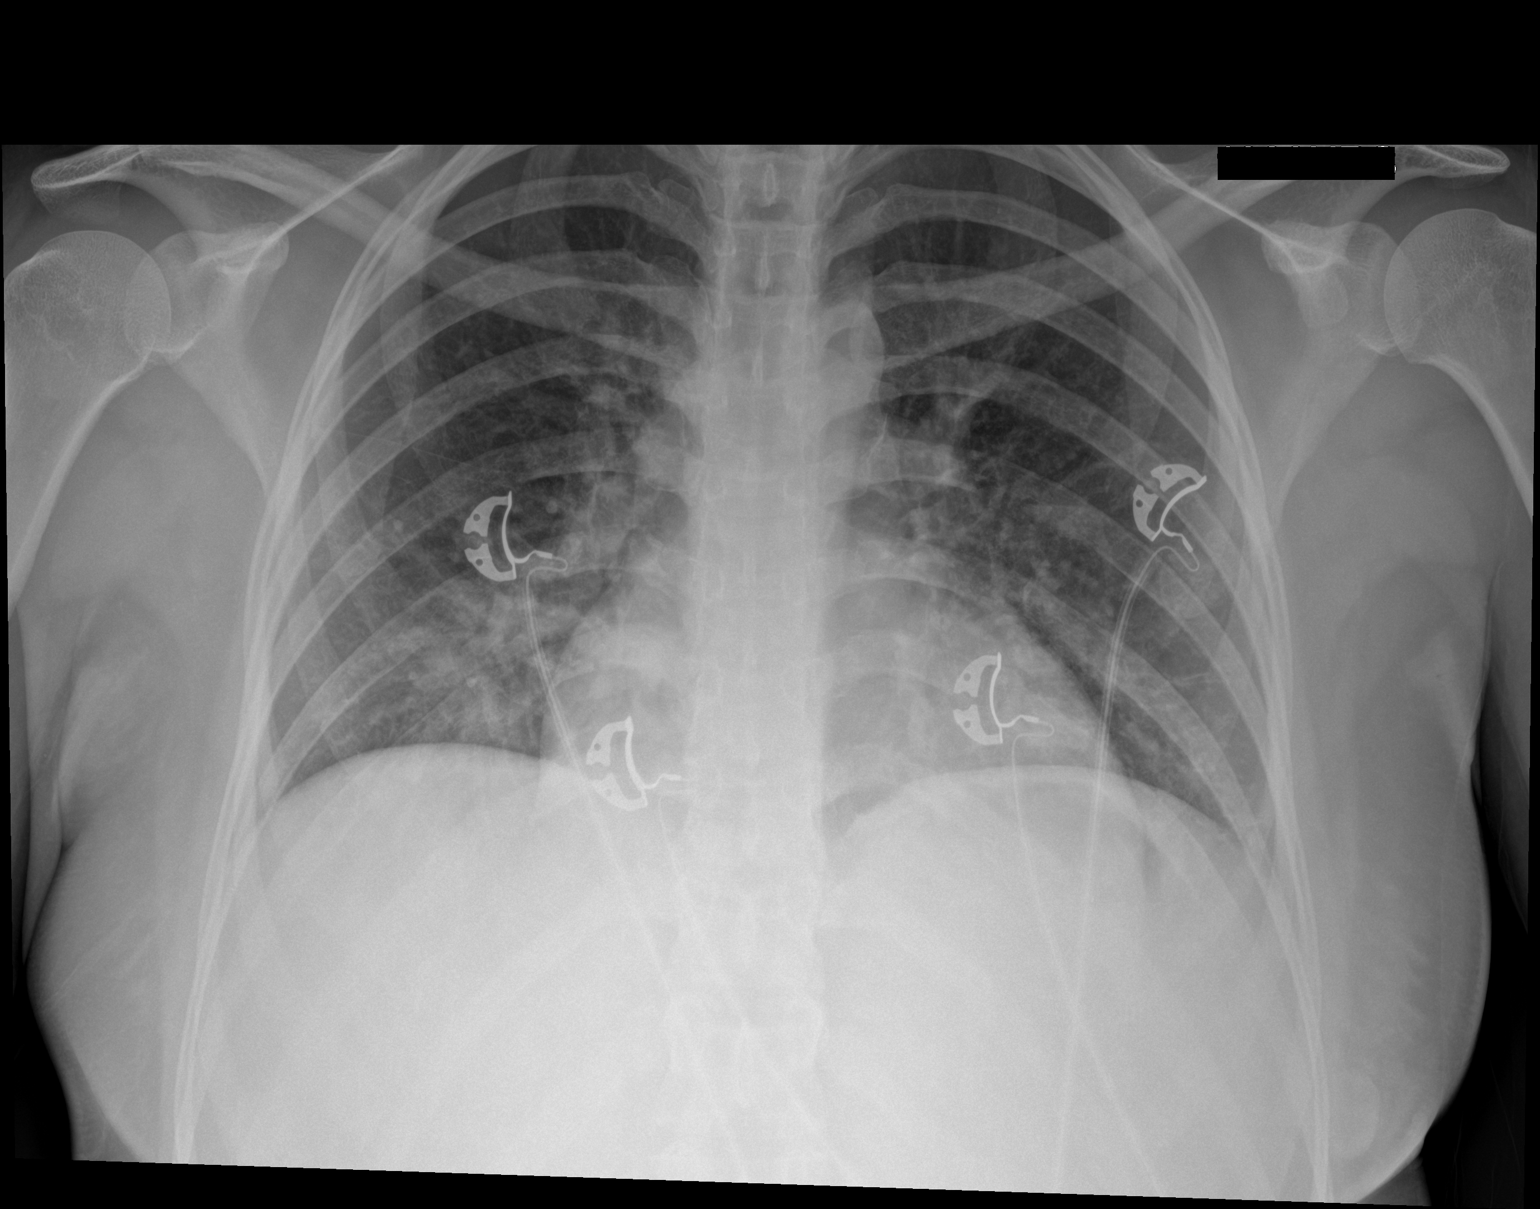

[1 of 1 positions shown; findings below may reference images not displayed]

FINDINGS: Shallow inspiration. No focal consolidation, pleural effusion,
pneumothorax. Stable cardiac silhouette. No acute osseous pathology.
IMPRESSION: No active disease.

## 2018-10-25 MED ORDER — IOHEXOL 350 MG/ML SOLN
75.0000 mL | Freq: Once | INTRAVENOUS | Status: AC | PRN
  Administered 2018-10-25: 22:00:00 75 mL via INTRAVENOUS

## 2018-10-25 NOTE — ED Triage Notes (Signed)
Patient states she was diagnosed with COVID on 8/21. States on 8/26 started feeling short of breath. States cont to feel short of breath especially at night. Speaking full sentences with no obvious resp distress. Patient is [redacted] weeks pregnant with no contractions, vaginal bleeding or fluid. Feeling baby move well.

## 2018-10-25 NOTE — ED Notes (Addendum)
Pt state SOB once she gets coughing, states chest pain with cough. States cough is intermittent. No fevers for 2 days. Took 2 tylenol earlier today for body aches. [redacted] weeks pregnant, feeling baby move, no vaginal bleeding/discharge, no contractions/cramps. States she was tested on 8/20, results came back positive for COVID on 8/21. Got tested because boyfriend came back positive.   A&O, no resp distress noted. Speaking in clear, complete sentences.

## 2018-10-25 NOTE — ED Notes (Signed)
Pt ambulated in room for 3 minutes. Pt's sats dropped to 88 % on room air after 2.5 minutes, RR increased to 30. Pt c/o of SOB. 30 seconds after pt returned to bed, O2 sat 98 on room air. RR decreased to 22 after 1 minute. Pt states that she does not feel any more SOB

## 2018-10-25 NOTE — ED Notes (Signed)
Patient transported to CT 

## 2018-10-25 NOTE — ED Provider Notes (Signed)
[facility] Emergency Department Provider Note  ____________________________________________   First MD Initiated Contact with Patient 10/25/18 1903     (approximate)  I have reviewed the triage vital signs and the nursing notes.   HISTORY  Chief Complaint Shortness of Breath    HPI Jeanne [name] is a 28 y.o. female with coronavirus who presents with shortness of breath.  On 8/21 patient was diagnosed with coronavirus.  She denies having symptoms originally but her boyfriend had coronavirus which is why she was tested.  She started feeling short of breath 8/26.  Shortness of breath is getting worse it is now severe, constant, nothing makes it better, worse with exertion.  Denies any leg swelling, prior history of blood clots.  She does have some intermittent fevers.  Has been tolerating p.o.          Past Medical History:  Diagnosis Date  . Anemia     Patient Active Problem List   Diagnosis Date Noted  . Supervision of other normal pregnancy, antepartum 09/09/2018  . History of postpartum hemorrhage 09/09/2018  . Hx of preeclampsia, prior pregnancy, currently pregnant 09/09/2018  . Adjustment disorder 09/09/2018    Past Surgical History:  Procedure Laterality Date  . DILATION AND EVACUATION N/A 02/09/2015   Procedure: DILATATION AND EVACUATION;  Surgeon: Christeen DouglasBethany Beasley, MD;  Location: [facility] ORS;  Service: Gynecology;  Laterality: N/A;  . LAPAROSCOPY  02/09/2015   Procedure: LAPAROSCOPY DIAGNOSTIC;  Surgeon: Christeen DouglasBethany Beasley, MD;  Location: [facility] ORS;  Service: Gynecology;;    Prior to Admission medications   Medication Sig Start Date End Date Taking? Authorizing Provider  aspirin EC 81 MG tablet Take 81 mg by mouth daily. Preeclampsia prevention    [provider]  Prenatal Vit-Fe Fumarate-FA (MULTIVITAMIN-PRENATAL) 27-0.8 MG TABS tablet Take 1 tablet by mouth daily at 12 noon.    [provider]    Allergies  Patient has no known allergies.  No family history on file.  Social History Social History   Tobacco Use  . Smoking status: Never Smoker  . Smokeless tobacco: Never Used  Substance Use Topics  . Alcohol use: Not Currently  . Drug use: No      Review of Systems Constitutional: No fever/chills Eyes: No visual changes. ENT: No sore throat. Cardiovascular: Positive chest pain Respiratory: Positive shortness of breath Gastrointestinal: No abdominal pain.  No nausea, no vomiting.  No diarrhea.  No constipation. Genitourinary: Negative for dysuria. Musculoskeletal: Negative for back pain. Skin: Negative for rash. Neurological: Negative for headaches, focal weakness or numbness. All other ROS negative ____________________________________________   PHYSICAL EXAM:  VITAL SIGNS: ED Triage Vitals  Enc Vitals Group     BP 10/25/18 1831 94/60     Pulse Rate 10/25/18 1831 70     Resp 10/25/18 1831 20     Temp 10/25/18 1831 98 F (36.7 C)     Temp Source 10/25/18 1831 Oral     SpO2 10/25/18 1831 98 %     Weight 10/25/18 1832 134 lb (60.8 kg)     Height 10/25/18 1832 5' (1.524 m)     Head Circumference --      Peak Flow --      Pain Score 10/25/18 1832 0     Pain Loc --      Pain Edu? --      Excl. in GC? --     Constitutional: Alert and oriented. Well appearing and in no acute distress.  Eyes: Conjunctivae are normal. EOMI. Head: Atraumatic. Nose: No congestion/rhinnorhea. Mouth/Throat: Mucous membranes are moist.   Neck: No stridor. Trachea Midline. FROM Cardiovascular: Normal rate, regular rhythm. Grossly normal heart sounds.  Good peripheral circulation. Respiratory: Normal respiratory effort.  No retractions. Lungs CTAB. Gastrointestinal: Soft and nontender. No distention. No abdominal bruits.  Musculoskeletal: No lower extremity tenderness nor edema.  No joint effusions. Neurologic:  Normal speech and language. No gross focal neurologic deficits are appreciated.   Skin:  Skin is warm, dry and intact. No rash noted. Psychiatric: Mood and affect are normal. Speech and behavior are normal. GU: Deferred   ____________________________________________   LABS (all labs ordered are listed, but only abnormal results are displayed)  Labs Reviewed  BASIC METABOLIC PANEL - Abnormal; Notable for the following components:      Result Value   CO2 21 (*)    Creatinine, Ser 0.33 (*)    All other components within normal limits  CBC WITH DIFFERENTIAL/PLATELET - Abnormal; Notable for the following components:   RBC 3.71 (*)    Hemoglobin 10.8 (*)    HCT 32.7 (*)    Abs Immature Granulocytes 0.09 (*)    All other components within normal limits  FIBRIN DERIVATIVES D-DIMER ([facility] ONLY) - Abnormal; Notable for the following components:   Fibrin derivatives D-dimer ([facility]) 613.49 (*)    All other components within normal limits  SARS CORONAVIRUS 2 (HOSPITAL ORDER, [place] [facility])  CBC WITH DIFFERENTIAL/PLATELET  TROPONIN I (HIGH SENSITIVITY)  TROPONIN I (HIGH SENSITIVITY)   ____________________________________________   ED ECG REPORT I, Vanessa Henrieville, the attending physician, personally viewed and interpreted this ECG.  EKG sinus rate of 73, no ST elevation, T wave inversion in lead III, normal intervals.  ____________________________________________  RADIOLOGY Robert Bellow, personally viewed and evaluated these images (plain radiographs) as part of my medical decision making, as well as reviewing the written report by the radiologist.  ED MD interpretation: Chest x-ray negative for effusion, pneumothorax, pneumonia  Official radiology report(s): Ct Angio Chest Pe W And/or Wo Contrast  Result Date: 10/25/2018 CLINICAL DATA:  Complex chest pain EXAM: CT ANGIOGRAPHY CHEST WITH CONTRAST TECHNIQUE: Multidetector CT imaging of the chest was performed using the standard protocol during bolus administration of intravenous contrast.  Multiplanar CT image reconstructions and MIPs were obtained to evaluate the vascular anatomy. CONTRAST:  28mL OMNIPAQUE IOHEXOL 350 MG/ML SOLN COMPARISON:  Chest x-ray 10/25/2018 FINDINGS: Cardiovascular: Satisfactory opacification of the pulmonary arteries to the segmental level. No evidence of pulmonary embolism. Nonaneurysmal aorta. No dissection seen. Heart size upper normal. No pericardial effusion. Mediastinum/Nodes: No enlarged mediastinal, hilar, or axillary lymph nodes. Thyroid gland, trachea, and esophagus demonstrate no significant findings. Lungs/Pleura: Partial consolidation in the right lower lobe consistent with a pneumonia. Small focus of airspace disease and surrounding ground-glass density in the posterior right upper lobe. Small focus of consolidation within the lateral aspect of left lower lobe. No pleural effusion. Upper Abdomen: Steatosis.  No acute abnormality Musculoskeletal: No chest wall abnormality. No acute or significant osseous findings. Review of the MIP images confirms the above findings. IMPRESSION: 1. Negative for acute pulmonary embolus or aortic dissection. 2. Partial consolidation within the right lower lobe with addition consolidations and ground-glass density in the right upper lobe and left lung base, consistent with a multifocal pneumonia. 3. Hepatic steatosis Electronically Signed   By: Donavan Foil M.D.   On: 10/25/2018 22:08   Dg Chest Portable 1 View  Result Date: 10/25/2018 CLINICAL DATA:  28 year old female with diagnosis of coli 19 presenting with shortness of breath. EXAM: PORTABLE CHEST 1 VIEW COMPARISON:  Chest radiograph date 07/28/2017 FINDINGS: Shallow inspiration. No focal consolidation, pleural effusion, pneumothorax. Stable cardiac silhouette. No acute osseous pathology. IMPRESSION: No active disease. Electronically Signed   By: Elgie CollardArash  Radparvar M.D.   On: 10/25/2018 19:57    ____________________________________________   PROCEDURES  Procedure(s)  performed (including Critical Care):  Procedures   ____________________________________________   INITIAL IMPRESSION / ASSESSMENT AND PLAN / ED COURSE  Nyja M Cleone SlimLopez [name] was evaluated in Emergency Department on 10/25/2018 for the symptoms described in the history of present illness. She was evaluated in the context of the global COVID-19 pandemic, which necessitated consideration that the patient might be at risk for infection with the SARS-CoV-2 virus that causes COVID-19. Institutional protocols and algorithms that pertain to the evaluation of patients at risk for COVID-19 are in a state of rapid change based on information released by regulatory bodies including the CDC and federal and state organizations. These policies and algorithms were followed during the patient's care in the ED.    Patient is a well-appearing 28 year old with coronavirus who presents with worsening shortness of breath over the past few days.  I did have lengthy discussion with patient that she now has 2 risk factors for pulmonary embolism given her coronavirus as well as being pregnant.  Initially  would hold off on further testing but patient's EKG just show a [id number].  Given patient's risk factors patient would like to proceed with d-dimer and if positive CT scan.  Lower suspicion for pneumothorax, pleural effusion, ACS, myocarditis but will confirm with labs and x-ray.  Cardiac markers are normal.  Normal white count.  Hemoglobin at 10.8.  D-dimer was elevated so we will proceed with CT scan.  I discussed with Dr. Feliberto GottronSchermerhorn and they are okay with CT.   CT scan is consistent with multiple focal pneumonia from the coronavirus.  No evidence of pulmonary embolism.   When patient ambulates she desat to [id number], and feels pretty severe shortness of breath and chest tightness and has RR to 30s.  Patient takes about 30 seconds to recover.  Given this desaturation I discussed with patient she was acute being admitted.  She  will need consideration of steroids, antivirals.  [facility] coronavirus  [facility]  Given patient is pregnant they [facility]  [facility].  Asked with the transfer team and they recommended us talking to our team for admission given she is viable.  Discussed with Marylene Landngela from the hospital team and [facility] since [facility] do not admit viable pregnant patients.  D/w Dr. Feliberto GottronSchermerhorn and they will admit patient.  Will hold off on steroids and let OB make that decision given pregnancy but will need consideration of antivirals and steroids inpatient.     ____________________________________________   FINAL CLINICAL IMPRESSION(S) / ED DIAGNOSES   Final diagnoses:  COVID-19 virus detected  Hypoxia      MEDICATIONS GIVEN DURING THIS VISIT:  Medications  iohexol (OMNIPAQUE) 350 MG/ML injection 75 mL (75 mLs Intravenous Contrast Given 10/25/18 2148)     ED Discharge Orders    None       Note:  This document was prepared using Dragon voice recognition software and may include unintentional dictation errors.  Concha Se, MD 10/26/18 [phone]

## 2018-10-26 DIAGNOSIS — U071 COVID-19: Secondary | ICD-10-CM | POA: Diagnosis present

## 2018-10-26 DIAGNOSIS — Z3A26 26 weeks gestation of pregnancy: Secondary | ICD-10-CM | POA: Diagnosis not present

## 2018-10-26 DIAGNOSIS — O99342 Other mental disorders complicating pregnancy, second trimester: Secondary | ICD-10-CM | POA: Diagnosis present

## 2018-10-26 DIAGNOSIS — O98512 Other viral diseases complicating pregnancy, second trimester: Secondary | ICD-10-CM | POA: Diagnosis present

## 2018-10-26 DIAGNOSIS — R0602 Shortness of breath: Secondary | ICD-10-CM | POA: Diagnosis not present

## 2018-10-26 DIAGNOSIS — J1289 Other viral pneumonia: Secondary | ICD-10-CM | POA: Diagnosis present

## 2018-10-26 DIAGNOSIS — O99891 Other specified diseases and conditions complicating pregnancy: Secondary | ICD-10-CM

## 2018-10-26 DIAGNOSIS — O99512 Diseases of the respiratory system complicating pregnancy, second trimester: Secondary | ICD-10-CM | POA: Diagnosis present

## 2018-10-26 DIAGNOSIS — F4320 Adjustment disorder, unspecified: Secondary | ICD-10-CM | POA: Diagnosis present

## 2018-10-26 LAB — LACTATE DEHYDROGENASE: LDH: 165 U/L (ref 98–192)

## 2018-10-26 LAB — MAGNESIUM: Magnesium: 2 mg/dL (ref 1.7–2.4)

## 2018-10-26 LAB — SARS CORONAVIRUS 2 BY RT PCR (HOSPITAL ORDER, PERFORMED IN ~~LOC~~ HOSPITAL LAB): SARS Coronavirus 2: POSITIVE — AB

## 2018-10-26 LAB — PROTEIN, URINE, RANDOM: Total Protein, Urine: 21 mg/dL

## 2018-10-26 LAB — FERRITIN: Ferritin: 27 ng/mL (ref 11–307)

## 2018-10-26 LAB — C-REACTIVE PROTEIN: CRP: 2.5 mg/dL — ABNORMAL HIGH (ref <1.0)

## 2018-10-26 MED ORDER — FERROUS SULFATE 325 (65 FE) MG PO TABS
325.0000 mg | ORAL_TABLET | Freq: Two times a day (BID) | ORAL | Status: DC
  Administered 2018-10-26 – 2018-10-27 (×3): 325 mg via ORAL
  Filled 2018-10-26 (×3): qty 1

## 2018-10-26 MED ORDER — SODIUM CHLORIDE 0.9 % IV SOLN
2.0000 g | INTRAVENOUS | Status: DC
  Administered 2018-10-26: 07:00:00 2 g via INTRAVENOUS
  Filled 2018-10-26 (×3): qty 20

## 2018-10-26 MED ORDER — SODIUM CHLORIDE 0.9 % IV SOLN
500.0000 mg | INTRAVENOUS | Status: DC
  Administered 2018-10-26: 500 mg via INTRAVENOUS
  Filled 2018-10-26 (×2): qty 500

## 2018-10-26 MED ORDER — VITAMIN D 25 MCG (1000 UNIT) PO TABS
1000.0000 [IU] | ORAL_TABLET | Freq: Every day | ORAL | Status: DC
  Administered 2018-10-26 – 2018-10-27 (×2): 1000 [IU] via ORAL
  Filled 2018-10-26 (×2): qty 1

## 2018-10-26 MED ORDER — ACETAMINOPHEN 325 MG PO TABS: 650.0000 mg | ORAL_TABLET | ORAL | Status: DC | PRN

## 2018-10-26 MED ORDER — METHYLPREDNISOLONE SODIUM SUCC 125 MG IJ SOLR
125.0000 mg | Freq: Once | INTRAMUSCULAR | Status: AC
  Administered 2018-10-26: 02:00:00 125 mg via INTRAVENOUS
  Filled 2018-10-26: qty 2

## 2018-10-26 MED ORDER — B COMPLEX-C PO TABS
1.0000 | ORAL_TABLET | Freq: Every day | ORAL | Status: DC
  Administered 2018-10-26 – 2018-10-27 (×2): 1 via ORAL
  Filled 2018-10-26 (×2): qty 1

## 2018-10-26 MED ORDER — ALBUTEROL SULFATE HFA 108 (90 BASE) MCG/ACT IN AERS
1.0000 | INHALATION_SPRAY | Freq: Four times a day (QID) | RESPIRATORY_TRACT | Status: DC | PRN
  Filled 2018-10-26: qty 6.7

## 2018-10-26 MED ORDER — PRENATAL MULTIVITAMIN CH
1.0000 | ORAL_TABLET | Freq: Every day | ORAL | Status: DC
  Administered 2018-10-26 – 2018-10-27 (×2): 1 via ORAL
  Filled 2018-10-26 (×2): qty 1

## 2018-10-26 MED ORDER — VITAMIN C 500 MG PO TABS
500.0000 mg | ORAL_TABLET | Freq: Two times a day (BID) | ORAL | Status: DC
  Administered 2018-10-26 – 2018-10-27 (×3): 500 mg via ORAL
  Filled 2018-10-26 (×3): qty 1

## 2018-10-26 MED ORDER — DOCUSATE SODIUM 100 MG PO CAPS: 100.0000 mg | ORAL_CAPSULE | Freq: Two times a day (BID) | ORAL | Status: DC | PRN

## 2018-10-26 MED ORDER — ENOXAPARIN SODIUM 40 MG/0.4ML ~~LOC~~ SOLN
40.0000 mg | Freq: Every day | SUBCUTANEOUS | Status: DC
  Administered 2018-10-26 – 2018-10-27 (×2): 40 mg via SUBCUTANEOUS
  Filled 2018-10-26 (×2): qty 0.4

## 2018-10-26 NOTE — ED Notes (Signed)
Patient ambulatory to the bathroom without assistance. Patient denies need for anything at this time. Will continue to monitor. Patient also informed, per MD, she will not transfer to [facility] but rather be admitted here.

## 2018-10-26 NOTE — H&P (Signed)
OB History & Physical   History of Present Illness:  Chief Complaint: shortness of breath  HPI:  Jeanne [name] is a 28 y.o. [phone] female at [redacted]w[redacted]d dated by LMP and c/w 13wk [place].  She presents to [facility] ED for worsening shortness of breath, cough and known COVID positive testing   Reports Active FM, denies LOF, UCs or VB.    Pregnancy Issues: 1. Prior GHTN at term with G1, taking baby ASA 2. Care at [facility]- initial dating [place] at 13wks at [facility]FM, anatomy US done 09/11/18 3. Resolved  4. Anemia, not currently on iron supplement 5. Exposed to COVID, tested Positive on 8/21, symptoms developed- SOB and cough 8/26   Maternal Medical History:   Past Medical History:  Diagnosis Date  . Anemia     Past Surgical History:  Procedure Laterality Date  . DILATION AND EVACUATION N/A 02/09/2015   Procedure: DILATATION AND EVACUATION;  Surgeon: [name], MD;  Location: [facility] ORS;  Service: Gynecology;  Laterality: N/A;  . LAPAROSCOPY  02/09/2015   Procedure: LAPAROSCOPY DIAGNOSTIC;  Surgeon: [name], MD;  Location: [facility] ORS;  Service: Gynecology;;    No Known Allergies  Prior to Admission medications   Medication Sig Start Date End Date Taking? Authorizing Provider  aspirin EC 81 MG tablet Take 81 mg by mouth daily. Preeclampsia prevention   Yes [provider]  Prenatal Vit-Fe Fumarate-FA (MULTIVITAMIN-PRENATAL) 27-0.8 MG TABS tablet Take 1 tablet by mouth daily at 12 noon.   Yes [provider]     Prenatal care site: [facility]   Social History: She  reports that she has never smoked. She has never used smokeless tobacco. She reports previous alcohol use. She reports that she does not use drugs.  Family History: no family hx Gyn cancers  Review of Systems: A full review of systems was performed and negative except as noted in the HPI.     Physical Exam:  Vital Signs: BP 111/67   Pulse (!) 59   Temp 98 F (36.7 C) (Oral)    Resp 20   Ht 5' (1.524 m)   Wt 60.8 kg   LMP 04/25/2018   SpO2 98%   BMI 26.17 kg/m  General: no acute distress.  HEENT: normocephalic, atraumatic Heart: regular rate & rhythm.  No murmurs/rubs/gallops Lungs: normal respiratory effort at rest, slightly tachypneic with RR 22, crackles and rales auscultated throughout lung fields. Abdomen: soft, gravid, non-tender Pelvic: deferred  Extremities: non-tender, symmetric, no edema bilaterally.  DTRs: 2+  Neurologic: Alert & oriented x 3.    Results for orders placed or performed during the hospital encounter of 10/25/18 (from the past 24 hour(s))  Basic metabolic panel     Status: Abnormal   Collection Time: 10/25/18  8:15 PM  Result Value Ref Range   Sodium 135 135 - 145 mmol/L   Potassium 4.2 3.5 - 5.1 mmol/L   Chloride 105 98 - 111 mmol/L   CO2 21 (L) 22 - 32 mmol/L   Glucose, Bld 89 70 - 99 mg/dL   BUN 9 6 - 20 mg/dL   Creatinine, Ser 0.26 (L) 0.44 - 1.00 mg/dL   Calcium 8.9 8.9 - 37.8 mg/dL   GFR calc non Af Amer >60 >60 mL/min   GFR calc Af Amer >60 >60 mL/min   Anion gap 9 5 - 15  Troponin I (High Sensitivity)     Status: None   Collection Time: 10/25/18  8:15 PM  Result Value Ref Range   Troponin I (High Sensitivity) 2 <18 ng/L  CBC with Differential/Platelet     Status: Abnormal   Collection Time: 10/25/18  8:42 PM  Result Value Ref Range   WBC 4.1 4.0 - 10.5 K/uL   RBC 3.71 (L) 3.87 - 5.11 MIL/uL   Hemoglobin 10.8 (L) 12.0 - 15.0 g/dL   HCT 32.7 (L) 36.0 - 46.0 %   MCV 88.1 80.0 - 100.0 fL   MCH 29.1 26.0 - 34.0 pg   MCHC 33.0 30.0 - 36.0 g/dL   RDW 13.2 11.5 - 15.5 %   Platelets 259 150 - 400 K/uL   nRBC 0.0 0.0 - 0.2 %   Neutrophils Relative % 61 %   Neutro Abs 2.5 1.7 - 7.7 K/uL   Lymphocytes Relative 29 %   Lymphs Abs 1.2 0.7 - 4.0 K/uL   Monocytes Relative 6 %   Monocytes Absolute 0.2 0.1 - 1.0 K/uL   Eosinophils Relative 2 %   Eosinophils Absolute 0.1 0.0 - 0.5 K/uL   Basophils Relative 0 %    Basophils Absolute 0.0 0.0 - 0.1 K/uL   Immature Granulocytes 2 %   Abs Immature Granulocytes 0.09 (H) 0.00 - 0.07 K/uL  Fibrin derivatives D-Dimer ([facility] only)     Status: Abnormal   Collection Time: 10/25/18  8:42 PM  Result Value Ref Range   Fibrin derivatives D-dimer ([facility]) 613.49 (H) 0.00 - 499.00 ng/mL (FEU)  Troponin I (High Sensitivity)     Status: None   Collection Time: 10/25/18 10:15 PM  Result Value Ref Range   Troponin I (High Sensitivity) <2 <18 ng/L  SARS Coronavirus 2 [facility] order, Performed in [place] hospital lab) Nasopharyngeal Nasopharyngeal Swab     Status: Abnormal   Collection Time: 10/25/18 11:24 PM   Specimen: Nasopharyngeal Swab  Result Value Ref Range   SARS Coronavirus 2 POSITIVE (A) NEGATIVE    Pertinent Results:  Prenatal Labs: Blood type/Rh O Pos  Antibody screen neg  Rubella Immune  Varicella Immune  RPR NR  HBsAg Neg  HIV NR  GC neg  Chlamydia neg  Genetic screening negative  1 hour GTT  not done yet  GBS  n/a   FHT: 142bpm on initial placement of external fetal monitor.  Non-stress test: 135 bpm, moderate variability, Pos accels 10*10, no decels TOCO: No UCs noted.  SVE: deferred   Ct Angio Chest Pe W And/or Wo Contrast  Result Date: 10/25/2018 CLINICAL DATA:  Complex chest pain EXAM: CT ANGIOGRAPHY CHEST WITH CONTRAST TECHNIQUE: Multidetector CT imaging of the chest was performed using the standard protocol during bolus administration of intravenous contrast. Multiplanar CT image reconstructions and MIPs were obtained to evaluate the vascular anatomy. CONTRAST:  54mL OMNIPAQUE IOHEXOL 350 MG/ML SOLN COMPARISON:  Chest x-ray 10/25/2018 FINDINGS: Cardiovascular: Satisfactory opacification of the pulmonary arteries to the segmental level. No evidence of pulmonary embolism. Nonaneurysmal aorta. No dissection seen. Heart size upper normal. No pericardial effusion. Mediastinum/Nodes: No enlarged mediastinal, hilar, or axillary lymph nodes.  Thyroid gland, trachea, and esophagus demonstrate no significant findings. Lungs/Pleura: Partial consolidation in the right lower lobe consistent with a pneumonia. Small focus of airspace disease and surrounding ground-glass density in the posterior right upper lobe. Small focus of consolidation within the lateral aspect of left lower lobe. No pleural effusion. Upper Abdomen: Steatosis.  No acute abnormality Musculoskeletal: No chest wall abnormality. No acute or significant osseous findings. Review of the MIP images confirms the above findings.  IMPRESSION: 1. Negative for acute pulmonary embolus or aortic dissection. 2. Partial consolidation within the right lower lobe with addition consolidations and ground-glass density in the right upper lobe and left lung base, consistent with a multifocal pneumonia. 3. Hepatic steatosis Electronically Signed   By: Jasmine PangKim  Fujinaga M.D.   On: 10/25/2018 22:08   Dg Chest Portable 1 View  Result Date: 10/25/2018 CLINICAL DATA:  28 year old female with diagnosis of coli 19 presenting with shortness of breath. EXAM: PORTABLE CHEST 1 VIEW COMPARISON:  Chest radiograph date 07/28/2017 FINDINGS: Shallow inspiration. No focal consolidation, pleural effusion, pneumothorax. Stable cardiac silhouette. No acute osseous pathology. IMPRESSION: No active disease. Electronically Signed   By: Elgie CollardArash  Radparvar M.D.   On: 10/25/2018 19:57    Assessment:  Jeanne [name] is a 28 y.o. [phone] female at 7033w2d with Community acquired pneumonia, COVID19 pos.    Plan:  [facility]. [facility] - consents reviewed and obtained - d/w Dr Feliberto GottronSchermerhorn, [facility], hospitalist consult on mgmt of pneumonia - monitor for sx preterm labor  2. Fetal Well being- plan for daily NSTs - Fetal Tracing: reactive NST for gestational age.  3. Routine OB: - Prenatal labs reviewed, as above - Rh O Pos - low risk pregnancy other than previous GHTN at term with G1.  - plan  for daily NSTs  4. COVID Pos - desaturation with ambulation on room air  - neg CT for PE, + multifocal pneumonia - consult Hospitalist for management of respiratory status   Randa NgoRebecca A Cannen Dupras, CNM 10/26/18 2:09 AM

## 2018-10-26 NOTE — ED Notes (Signed)
ED TO INPATIENT HANDOFF REPORT  ED Nurse Name and Phone #: Lea [phone]  S Name/Age/Gender Jeanne [name] 28 y.o. female Room/Bed: ED06A/ED06A  Code Status   Code Status: Full Code  Home/SNF/Other home Patient oriented to: x4 Is this baseline? Y   Triage Complete: Triage complete  Chief Complaint 25 Weeks Covid Positive Weak Diff Breathing  Triage Note Patient states she was diagnosed with COVID on 8/21. States on 8/26 started feeling short of breath. States cont to feel short of breath especially at night. Speaking full sentences with no obvious resp distress. Patient is [redacted] weeks pregnant with no contractions, vaginal bleeding or fluid. Feeling baby move well.    Allergies No Known Allergies  Level of Care/Admitting Diagnosis ED Disposition    ED Disposition Condition [facility]: [place] [[id number]]  Level of Care: Med-Surg [16]  Covid Evaluation: Confirmed COVID Positive  Diagnosis: Shortness of breath during pregnancy [[id number]]  Admitting Physician: Boykin Nearing [[id number]]  Attending Physician: MCVEY, REBECCA A [[id number]]  Estimated length of stay: past midnight tomorrow  Certification:: I certify this patient will need inpatient services for at least 2 midnights  PT Class (Do Not Modify): Inpatient [101]  PT Acc Code (Do Not Modify): Private [1]       B Medical/Surgery History Past Medical History:  Diagnosis Date  . Anemia    Past Surgical History:  Procedure Laterality Date  . DILATION AND EVACUATION N/A 02/09/2015   Procedure: DILATATION AND EVACUATION;  Surgeon: Benjaman Kindler, MD;  Location: [facility] ORS;  Service: Gynecology;  Laterality: N/A;  . LAPAROSCOPY  02/09/2015   Procedure: LAPAROSCOPY DIAGNOSTIC;  Surgeon: Benjaman Kindler, MD;  Location: [facility] ORS;  Service: Gynecology;;     A IV Location/Drains/Wounds Patient Lines/Drains/Airways Status   Active Line/Drains/Airways    Name:   Placement  date:   Placement time:   Site:   Days:   Peripheral IV 10/25/18 Left Antecubital   10/25/18    2025    Antecubital   1          Intake/Output Last 24 hours No intake or output data in the 24 hours ending 10/26/18 0453  Labs/Imaging Results for orders placed or performed during the hospital encounter of 10/25/18 (from the past 48 hour(s))  Basic metabolic panel     Status: Abnormal   Collection Time: 10/25/18  8:15 PM  Result Value Ref Range   Sodium 135 135 - 145 mmol/L   Potassium 4.2 3.5 - 5.1 mmol/L    Comment: HEMOLYSIS AT THIS LEVEL MAY AFFECT RESULT   Chloride 105 98 - 111 mmol/L   CO2 21 (L) 22 - 32 mmol/L   Glucose, Bld 89 70 - 99 mg/dL   BUN 9 6 - 20 mg/dL   Creatinine, Ser 0.33 (L) 0.44 - 1.00 mg/dL   Calcium 8.9 8.9 - 10.3 mg/dL   GFR calc non Af Amer >60 >60 mL/min   GFR calc Af Amer >60 >60 mL/min   Anion gap 9 5 - 15    Comment: Performed at [facility], [place], [place] [address]  Troponin I (High Sensitivity)     Status: None   Collection Time: 10/25/18  8:15 PM  Result Value Ref Range   Troponin I (High Sensitivity) 2 <18 ng/L    Comment: (NOTE) Elevated high sensitivity troponin I (hsTnI) values and significant  changes across serial measurements may suggest ACS but many  other  chronic and acute conditions are known to elevate hsTnI results.  Refer to the "Links" section for chest pain algorithms and additional  guidance. Performed at [facility], [address] [place], [place] [address]   CBC with Differential/Platelet     Status: Abnormal   Collection Time: 10/25/18  8:42 PM  Result Value Ref Range   WBC 4.1 4.0 - 10.5 K/uL   RBC 3.71 (L) 3.87 - 5.11 MIL/uL   Hemoglobin 10.8 (L) 12.0 - 15.0 g/dL   HCT 29.532.7 (L) 28.436.0 - 13.246.0 %   MCV 88.1 80.0 - 100.0 fL   MCH 29.1 26.0 - 34.0 pg   MCHC 33.0 30.0 - 36.0 g/dL   RDW 44.013.2 10.211.5 - 72.515.5 %   Platelets 259 150 - 400 K/uL   nRBC 0.0 0.0 - 0.2 %   Neutrophils  Relative % 61 %   Neutro Abs 2.5 1.7 - 7.7 K/uL   Lymphocytes Relative 29 %   Lymphs Abs 1.2 0.7 - 4.0 K/uL   Monocytes Relative 6 %   Monocytes Absolute 0.2 0.1 - 1.0 K/uL   Eosinophils Relative 2 %   Eosinophils Absolute 0.1 0.0 - 0.5 K/uL   Basophils Relative 0 %   Basophils Absolute 0.0 0.0 - 0.1 K/uL   Immature Granulocytes 2 %   Abs Immature Granulocytes 0.09 (H) 0.00 - 0.07 K/uL    Comment: Performed at [facility], [address] [place], [place] [address]  Fibrin derivatives D-Dimer [facility] only)     Status: Abnormal   Collection Time: 10/25/18  8:42 PM  Result Value Ref Range   Fibrin derivatives D-dimer ([facility]) 613.49 (H) 0.00 - 499.00 ng/mL (FEU)    Comment: (NOTE) <> Exclusion of Venous Thromboembolism (VTE) - OUTPATIENT ONLY   (Emergency Department or [place])   0-499 ng/ml (FEU): With a low to intermediate pretest probability                      for VTE this test result excludes the diagnosis                      of VTE.   >499 ng/ml (FEU) : VTE not excluded; additional work up for VTE is                      required. <> Testing on Inpatients and Evaluation of Disseminated Intravascular   Coagulation (DIC) Reference Range:   0-499 ng/ml (FEU) Performed at [facility], [address] [place], [place] [address]   Troponin I (High Sensitivity)     Status: None   Collection Time: 10/25/18 10:15 PM  Result Value Ref Range   Troponin I (High Sensitivity) <2 <18 ng/L    Comment: (NOTE) Elevated high sensitivity troponin I (hsTnI) values and significant  changes across serial measurements may suggest ACS but many other  chronic and acute conditions are known to elevate hsTnI results.  Refer to the "Links" section for chest pain algorithms and additional  guidance. Performed at [facility], [address] [place], [place] [address]   SARS Coronavirus 2 [facility](Hospital order, Performed in [facility] hospital lab) Nasopharyngeal  Nasopharyngeal Swab     Status: Abnormal   Collection Time: 10/25/18 11:24 PM   Specimen: Nasopharyngeal Swab  Result Value Ref Range   SARS Coronavirus 2 POSITIVE (A) NEGATIVE    Comment: RESULT CALLED TO, READ BACK BY AND  VERIFIED WITH: LEA FERGUSON 10/26/2018 AT 0045 BY HS (NOTE) If result is NEGATIVE SARS-CoV-2 target nucleic acids are NOT DETECTED. The SARS-CoV-2 RNA is generally detectable in upper and lower  respiratory specimens during the acute phase of infection. The lowest  concentration of SARS-CoV-2 viral copies this assay can detect is 250  copies / mL. A negative result does not preclude SARS-CoV-2 infection  and should not be used as the sole basis for treatment or other  patient management decisions.  A negative result may occur with  improper specimen collection / handling, submission of specimen other  than nasopharyngeal swab, presence of viral mutation(s) within the  areas targeted by this assay, and inadequate number of viral copies  (<250 copies / mL). A negative result must be combined with clinical  observations, patient history, and epidemiological information. If result is POSITIVE SARS-CoV-2 target nucleic acids are DETECTED.  The SARS-CoV-2 RNA is generally detectable in upper and lower  respiratory specimens during the acute phase of infection.  Positive  results are indicative of active infection with SARS-CoV-2.  Clinical  correlation with patient history and other diagnostic information is  necessary to determine patient infection status.  Positive results do  not rule out bacterial infection or co-infection with other viruses. If result is PRESUMPTIVE POSTIVE SARS-CoV-2 nucleic acids MAY BE PRESENT.   A presumptive positive result was obtained on the submitted specimen  and confirmed on repeat testing.  While 2019 novel coronavirus  (SARS-CoV-2) nucleic acids may be present in the submitted sample  additional confirmatory testing may be necessary  for epidemiological  and / or clinical management purposes  to differentiate between  SARS-CoV-2 and other Sarbecovirus currently known to infect humans.  If clinically indicated additional testing with an alternate test  methodology [phone]) i s advised. The SARS-CoV-2 RNA is generally  detectable in upper and lower respiratory specimens during the acute  phase of infection. The expected result is Negative. Fact Sheet for Patients:  BoilerBrush.com.cy Fact Sheet for Healthcare Providers: https://pope.com/ This test is not yet approved or cleared by the [place] FDA and has been authorized for detection and/or diagnosis of SARS-CoV-2 by FDA under an Emergency Use Authorization (EUA).  This EUA will remain in effect (meaning this test can be used) for the duration of the COVID-19 declaration under Section 564(b)(1) of the Act, 21 U.S.C. section 360bbb-3(b)(1), unless the authorization is terminated or revoked sooner. Performed at [facility], [address] [place], [place] [address]    Ct Angio Chest Pe W And/or Wo Contrast  Result Date: 10/25/2018 CLINICAL DATA:  Complex chest pain EXAM: CT ANGIOGRAPHY CHEST WITH CONTRAST TECHNIQUE: Multidetector CT imaging of the chest was performed using the standard protocol during bolus administration of intravenous contrast. Multiplanar CT image reconstructions and MIPs were obtained to evaluate the vascular anatomy. CONTRAST:  27mL OMNIPAQUE IOHEXOL 350 MG/ML SOLN COMPARISON:  Chest x-ray 10/25/2018 FINDINGS: Cardiovascular: Satisfactory opacification of the pulmonary arteries to the segmental level. No evidence of pulmonary embolism. Nonaneurysmal aorta. No dissection seen. Heart size upper normal. No pericardial effusion. Mediastinum/Nodes: No enlarged mediastinal, hilar, or axillary lymph nodes. Thyroid gland, trachea, and esophagus demonstrate no significant findings. Lungs/Pleura:  Partial consolidation in the right lower lobe consistent with a pneumonia. Small focus of airspace disease and surrounding ground-glass density in the posterior right upper lobe. Small focus of consolidation within the lateral aspect of left lower lobe. No pleural effusion. Upper Abdomen: Steatosis.  No acute abnormality Musculoskeletal: No chest  wall abnormality. No acute or significant osseous findings. Review of the MIP images confirms the above findings. IMPRESSION: 1. Negative for acute pulmonary embolus or aortic dissection. 2. Partial consolidation within the right lower lobe with addition consolidations and ground-glass density in the right upper lobe and left lung base, consistent with a multifocal pneumonia. 3. Hepatic steatosis Electronically Signed   By: Jasmine PangKim  Fujinaga M.D.   On: 10/25/2018 22:08   Dg Chest Portable 1 View  Result Date: 10/25/2018 CLINICAL DATA:  28 year old female with diagnosis of coli 19 presenting with shortness of breath. EXAM: PORTABLE CHEST 1 VIEW COMPARISON:  Chest radiograph date 07/28/2017 FINDINGS: Shallow inspiration. No focal consolidation, pleural effusion, pneumothorax. Stable cardiac silhouette. No acute osseous pathology. IMPRESSION: No active disease. Electronically Signed   By: Elgie CollardArash  Radparvar M.D.   On: 10/25/2018 19:57    Pending Labs Unresulted Labs (From admission, onward)    Start     Ordered   10/27/18 0500  CBC with Differential/Platelet  Daily,   STAT     10/26/18 0131   10/27/18 0500  Comprehensive metabolic panel  Daily,   STAT     10/26/18 0131   10/26/18 0129  Sputum culture  (Non-severe pneumonia (non-ICU care) in adult without resistant organism risk factors.)  Once,   STAT    Question:  Patient immune status  Answer:  Normal   10/26/18 0131   10/26/18 0127  HIV antibody (Routine Screening)  Once,   STAT     10/26/18 0131   10/26/18 0122  C-reactive protein  Once,   STAT     10/26/18 0131   10/26/18 0122  Lactate dehydrogenase  Once,    STAT     10/26/18 0131   10/25/18 1930  CBC with Differential  ONCE - STAT,   STAT     10/25/18 1931          Vitals/Pain Today's Vitals   10/26/18 0300 10/26/18 0330 10/26/18 0400 10/26/18 0430  BP: (Abnormal) 97/48 (Abnormal) 95/52 104/62 (Abnormal) 104/57  Pulse: 61  71 67  Resp: 18 16 18  (Abnormal) 22  Temp:      TempSrc:      SpO2: 97%  97% 96%  Weight:      Height:      PainSc:        Isolation Precautions Airborne and Contact precautions  Medications Medications  cefTRIAXone (ROCEPHIN) 2 g in sodium chloride 0.9 % 100 mL IVPB (has no administration in time range)  azithromycin (ZITHROMAX) 500 mg in sodium chloride 0.9 % 250 mL IVPB (has no administration in time range)  albuterol (VENTOLIN HFA) 108 (90 Base) MCG/ACT inhaler 1 puff (has no administration in time range)  prenatal multivitamin tablet 1 tablet (has no administration in time range)  ferrous sulfate tablet 325 mg (has no administration in time range)  docusate sodium (COLACE) capsule 100 mg (has no administration in time range)  acetaminophen (TYLENOL) tablet 650 mg (has no administration in time range)  iohexol (OMNIPAQUE) 350 MG/ML injection 75 mL (75 mLs Intravenous Contrast Given 10/25/18 2148)  methylPREDNISolone sodium succinate (SOLU-MEDROL) 125 mg/2 mL injection 125 mg (125 mg Intravenous Given 10/26/18 0144)    Mobility  Low fall risk   Focused Assessments    R Recommendations: See Admitting Provider Note  Report given to:   Additional Notes:

## 2018-10-26 NOTE — ED Notes (Signed)
Report given to Boyd, Therapist, sports. Patient to floor via stretcher.

## 2018-10-26 NOTE — ED Notes (Signed)
Lab called to report patient is still positive for Covid

## 2018-10-26 NOTE — ED Notes (Signed)
Patient resting comfortably without need for anything at this time.

## 2018-10-26 NOTE — Consult Note (Signed)
[facility]   PATIENT NAME: Jeanne [name]    MR#:  [id number]  DATE OF BIRTH:  1990-05-18  DATE OF CONSULT:  10/26/2018  PRIMARY CARE PHYSICIAN: Department, [facility]   REQUESTING/REFERRING PHYSICIAN: Chiquita Loth, MD  CHIEF COMPLAINT:   Chief Complaint  Patient presents with  . Shortness of Breath    HISTORY OF PRESENT ILLNESS:  Jeanne [name]  is a 28 y.o. female diagnosed with COVID-19 virus on 10/17/2018.  Hospitalist service has been consulted for medical management.  She presented to the emergency room complaining of a 4-day history of symptoms including increased shortness of breath and nonproductive cough as well as low-grade subjective fevers and chills.  She did not have symptoms originally on 10/17/2018 when she was tested.  She was tested due to the fact her boyfriend had a positive COVID-19 test.  She began having symptoms on 10/22/2018.  Over the last 12 hours, shortness of breath has become worse now present with minimal exertion such as ambulating from room to room in her home.  She has noted no increased edema of her extremities.  She has no prior history of DVT or pulmonary embolism.  She denies nausea or vomiting.  She denies abdominal pain.  Patient has been admitted to the [facility] service by the nurse midwife.  We will continue to follow along during this patient's hospitalization  PAST MEDICAL HISTORY:   Past Medical History:  Diagnosis Date  . Anemia     PAST SURGICAL HISTOIRY:   Past Surgical History:  Procedure Laterality Date  . DILATION AND EVACUATION N/A 02/09/2015   Procedure: DILATATION AND EVACUATION;  Surgeon: [name], MD;  Location: [facility] ORS;  Service: Gynecology;  Laterality: N/A;  . LAPAROSCOPY  02/09/2015   Procedure: LAPAROSCOPY DIAGNOSTIC;  Surgeon: [name], MD;  Location: [facility] ORS;  Service: Gynecology;;    SOCIAL HISTORY:   Social History   Tobacco Use  .  Smoking status: Never Smoker  . Smokeless tobacco: Never Used  Substance Use Topics  . Alcohol use: Not Currently    FAMILY HISTORY:  No family history on file.  DRUG ALLERGIES:  No Known Allergies  REVIEW OF SYSTEMS:   Review of Systems  Constitutional: Positive for chills, fever and malaise/fatigue.  HENT: Negative for congestion and sore throat.   Eyes: Negative for blurred vision and double vision.  Respiratory: Positive for cough (occasional and nonproductive) and shortness of breath. Negative for hemoptysis, sputum production and wheezing.   Cardiovascular: Negative for chest pain and palpitations.  Gastrointestinal: Negative for abdominal pain, blood in stool, constipation, diarrhea, heartburn, nausea and vomiting.  Genitourinary: Negative for dysuria, flank pain and hematuria.  Musculoskeletal: Negative for falls and myalgias.  Skin: Negative for itching and rash.  Neurological: Negative for dizziness, weakness and headaches.  Psychiatric/Behavioral: Negative for depression.     MEDICATIONS AT HOME:   Prior to Admission medications   Medication Sig Start Date End Date Taking? Authorizing Provider  aspirin EC 81 MG tablet Take 81 mg by mouth daily. Preeclampsia prevention   Yes [provider]  Prenatal Vit-Fe Fumarate-FA (MULTIVITAMIN-PRENATAL) 27-0.8 MG TABS tablet Take 1 tablet by mouth daily at 12 noon.   Yes [provider]      VITAL SIGNS:  Blood pressure 111/67, pulse (!) 59, temperature 98 F (36.7 C), temperature source Oral, resp. rate 20, height 5' (1.524 m), weight 60.8 kg, last menstrual period 04/25/2018, SpO2 98 %,  unknown if currently breastfeeding.  PHYSICAL EXAMINATION:  GENERAL:  28 y.o.-year-old patient lying in the bed with no acute distress.  EYES: Pupils equal, round, reactive to light and accommodation. No scleral icterus. Extraocular muscles intact.  HEENT: Head atraumatic, normocephalic. Oropharynx and nasopharynx clear.   NECK:  Supple, no jugular venous distention. No thyroid enlargement, no tenderness.  LUNGS: Normal breath sounds bilaterally, no wheezing, rales, rhonchi . No use of accessory muscles of respiration.  CARDIOVASCULAR: S1, S2, RRR. No murmurs, rubs, gallops, clicks.  ABDOMEN: Appropriately distended for gestational age. Bowel sounds present. No organomegaly or mass.  EXTREMITIES: No pedal edema, cyanosis, or clubbing.  NEUROLOGIC: Cranial nerves II through XII are intact. No focal motor or sensory deficits appreciated bilaterally  PSYCHIATRIC: The patient is alert and oriented x 3. Good affect SKIN: No obvious rash, lesion, or ulcer.   LABORATORY PANEL:   CBC Recent Labs  Lab 10/25/18 2042  WBC 4.1  HGB 10.8*  HCT 32.7*  PLT 259   ------------------------------------------------------------------------------------------------------------------  Chemistries  Recent Labs  Lab 10/25/18 2015  NA 135  K 4.2  CL 105  CO2 21*  GLUCOSE 89  BUN 9  CREATININE 0.33*  CALCIUM 8.9   ------------------------------------------------------------------------------------------------------------------  Cardiac Enzymes No results for input(s): TROPONINI in the last 168 hours. ------------------------------------------------------------------------------------------------------------------  RADIOLOGY:  Ct Angio Chest Pe W And/or Wo Contrast  Result Date: 10/25/2018 CLINICAL DATA:  Complex chest pain EXAM: CT ANGIOGRAPHY CHEST WITH CONTRAST TECHNIQUE: Multidetector CT imaging of the chest was performed using the standard protocol during bolus administration of intravenous contrast. Multiplanar CT image reconstructions and MIPs were obtained to evaluate the vascular anatomy. CONTRAST:  75mL OMNIPAQUE IOHEXOL 350 MG/ML SOLN COMPARISON:  Chest x-ray 10/25/2018 FINDINGS: Cardiovascular: Satisfactory opacification of the pulmonary arteries to the segmental level. No evidence of pulmonary embolism.  Nonaneurysmal aorta. No dissection seen. Heart size upper normal. No pericardial effusion. Mediastinum/Nodes: No enlarged mediastinal, hilar, or axillary lymph nodes. Thyroid gland, trachea, and esophagus demonstrate no significant findings. Lungs/Pleura: Partial consolidation in the right lower lobe consistent with a pneumonia. Small focus of airspace disease and surrounding ground-glass density in the posterior right upper lobe. Small focus of consolidation within the lateral aspect of left lower lobe. No pleural effusion. Upper Abdomen: Steatosis.  No acute abnormality Musculoskeletal: No chest wall abnormality. No acute or significant osseous findings. Review of the MIP images confirms the above findings. IMPRESSION: 1. Negative for acute pulmonary embolus or aortic dissection. 2. Partial consolidation within the right lower lobe with addition consolidations and ground-glass density in the right upper lobe and left lung base, consistent with a multifocal pneumonia. 3. Hepatic steatosis Electronically Signed   By: Jasmine PangKim  Fujinaga M.D.   On: 10/25/2018 22:08   Dg Chest Portable 1 View  Result Date: 10/25/2018 CLINICAL DATA:  28 year old female with diagnosis of coli 19 presenting with shortness of breath. EXAM: PORTABLE CHEST 1 VIEW COMPARISON:  Chest radiograph date 07/28/2017 FINDINGS: Shallow inspiration. No focal consolidation, pleural effusion, pneumothorax. Stable cardiac silhouette. No acute osseous pathology. IMPRESSION: No active disease. Electronically Signed   By: Elgie CollardArash  Radparvar M.D.   On: 10/25/2018 19:57     IMPRESSION AND PLAN:   1.  COVID-19 positive - Patient has been placed on appropriate isolation precautions - D-dimer, CRP is pending - Fever is being managed with as needed Tylenol  2.  Multifocal pneumonia - IV antibiotic therapy with Rocephin and azithromycin - PRN albuterol inhaler -O2 PRN per nasal cannula for shortness of  breath - Will need to repeat chest x-ray to ensure  resolution  3.  25-week gestation pregnancy - Being followed by nurse midwife  We thank you for this consultation and we will continue to follow along with you during this patient's hospitalization and to disposition home.    All the records are reviewed and case discussed with Consulting provider. Management plans discussed with the patient, family and they are in agreement.  CODE STATUS: Full Code  TOTAL TIME TAKING CARE OF THIS PATIENT: 45 minutes.    [place] on 10/26/2018 at 1:32 AM  Between 7am to 6pm - Pager - [phone]  After 6pm go to [email or web address] - password [email or web address] [facility]  [place] [facility]  Office  [phone]  CC: Primary care Physician: Department, [facility]

## 2018-10-26 NOTE — Progress Notes (Signed)
ANTEPARTUM PROGRESS NOTE  Jeanne [name] is a 28 y.o. [phone] at 6522w2d [facility] Estimated Date of Delivery: 01/30/19 who is admitted for  Length of Stay:  0 Days. Admitted 10/25/2018  Subjective: Pt reports feeling well overall, states her cough is better since admission.  Patient reports good fetal movement.  She reports No uterine contractions, no bleeding and no loss of fluid per vagina.  Vitals:  BP (!) 102/58 (BP Location: Right Arm)   Pulse 78   Temp 98.2 F (36.8 C) (Oral)   Resp (!) 24   Ht 5' (1.524 m)   Wt 60.8 kg   LMP 04/25/2018   SpO2 99%   BMI 26.17 kg/m   Physical Examination: CONSTITUTIONAL: Well-developed, well-nourished female in no acute distress.  HENT:  Normocephalic, atraumatic EYES: Conjunctivae and EOM are normal. No scleral icterus.  NECK: Normal range of motion, supple, no masses SKIN: Skin is warm and dry. No rash noted. Not diaphoretic. No erythema. No pallor. NEUROLGIC: Alert and oriented to person, place, and time. Normal reflexes, muscle tone coordination.  PSYCHIATRIC: Normal mood and affect. Normal behavior. Normal judgment and thought content. MUSCULOSKELETAL: Normal range of motion. No edema and no tenderness. 2+ distal pulses. ABDOMEN: Soft, nontender, nondistended, gravid.  Fetal monitoring: FHR: 155bpm,  Uterine activity:  NST completed: Reactive, appropriate for GA Baseline: 150 bpm Variability: moderate Accels: 10*10 present Decels: absent  Results for orders placed or performed during the hospital encounter of 10/25/18 (from the past 48 hour(s))  Basic metabolic panel     Status: Abnormal   Collection Time: 10/25/18  8:15 PM  Result Value Ref Range   Sodium 135 135 - 145 mmol/L   Potassium 4.2 3.5 - 5.1 mmol/L    Comment: HEMOLYSIS AT THIS LEVEL MAY AFFECT RESULT   Chloride 105 98 - 111 mmol/L   CO2 21 (L) 22 - 32 mmol/L   Glucose, Bld 89 70 - 99 mg/dL   BUN 9 6 - 20 mg/dL   Creatinine, Ser 6.570.33 (L) 0.44 - 1.00  mg/dL   Calcium 8.9 8.9 - 84.610.3 mg/dL   GFR calc non Af Amer >60 >60 mL/min   GFR calc Af Amer >60 >60 mL/min   Anion gap 9 5 - 15    Comment: Performed at [facility], [address] [place], [place] [address]  Troponin I (High Sensitivity)     Status: None   Collection Time: 10/25/18  8:15 PM  Result Value Ref Range   Troponin I (High Sensitivity) 2 <18 ng/L    Comment: (NOTE) Elevated high sensitivity troponin I (hsTnI) values and significant  changes across serial measurements may suggest ACS but many other  chronic and acute conditions are known to elevate hsTnI results.  Refer to the "Links" section for chest pain algorithms and additional  guidance. Performed at [facility], [address] [place], [place] [address]   CBC with Differential/Platelet     Status: Abnormal   Collection Time: 10/25/18  8:42 PM  Result Value Ref Range   WBC 4.1 4.0 - 10.5 K/uL   RBC 3.71 (L) 3.87 - 5.11 MIL/uL   Hemoglobin 10.8 (L) 12.0 - 15.0 g/dL   HCT 24.432.7 (L) 01.036.0 - 27.246.0 %   MCV 88.1 80.0 - 100.0 fL   MCH 29.1 26.0 - 34.0 pg   MCHC 33.0 30.0 - 36.0 g/dL   RDW 53.613.2 64.411.5 - 03.415.5 %   Platelets 259 150 - 400 K/uL  nRBC 0.0 0.0 - 0.2 %   Neutrophils Relative % 61 %   Neutro Abs 2.5 1.7 - 7.7 K/uL   Lymphocytes Relative 29 %   Lymphs Abs 1.2 0.7 - 4.0 K/uL   Monocytes Relative 6 %   Monocytes Absolute 0.2 0.1 - 1.0 K/uL   Eosinophils Relative 2 %   Eosinophils Absolute 0.1 0.0 - 0.5 K/uL   Basophils Relative 0 %   Basophils Absolute 0.0 0.0 - 0.1 K/uL   Immature Granulocytes 2 %   Abs Immature Granulocytes 0.09 (H) 0.00 - 0.07 K/uL    Comment: Performed at [facility], [address] [place], [place] [address]  Fibrin derivatives [facility] only)     Status: Abnormal   Collection Time: 10/25/18  8:42 PM  Result Value Ref Range   Fibrin derivatives D-dimer ([facility]) 613.49 (H) 0.00 - 499.00 ng/mL (FEU)    Comment: (NOTE) <> Exclusion of Venous  Thromboembolism (VTE) - OUTPATIENT ONLY   (Emergency Department or [place])   0-499 ng/ml (FEU): With a low to intermediate pretest probability                      for VTE this test result excludes the diagnosis                      of VTE.   >499 ng/ml (FEU) : VTE not excluded; additional work up for VTE is                      required. <> Testing on Inpatients and Evaluation of Disseminated Intravascular   Coagulation (DIC) Reference Range:   0-499 ng/ml (FEU) Performed at [facility], [address] [place], [place] [address]   Troponin I (High Sensitivity)     Status: None   Collection Time: 10/25/18 10:15 PM  Result Value Ref Range   Troponin I (High Sensitivity) <2 <18 ng/L    Comment: (NOTE) Elevated high sensitivity troponin I (hsTnI) values and significant  changes across serial measurements may suggest ACS but many other  chronic and acute conditions are known to elevate hsTnI results.  Refer to the "Links" section for chest pain algorithms and additional  guidance. Performed at [facility], [address] [place], [place] [address]   SARS Coronavirus 2 [facility] order, Performed in [facility] hospital lab) Nasopharyngeal Nasopharyngeal Swab     Status: Abnormal   Collection Time: 10/25/18 11:24 PM   Specimen: Nasopharyngeal Swab  Result Value Ref Range   SARS Coronavirus 2 POSITIVE (A) NEGATIVE    Comment: RESULT CALLED TO, READ BACK BY AND VERIFIED WITH: LEA FERGUSON 10/26/2018 AT 0045 BY HS (NOTE) If result is NEGATIVE SARS-CoV-2 target nucleic acids are NOT DETECTED. The SARS-CoV-2 RNA is generally detectable in upper and lower  respiratory specimens during the acute phase of infection. The lowest  concentration of SARS-CoV-2 viral copies this assay can detect is 250  copies / mL. A negative result does not preclude SARS-CoV-2 infection  and should not be used as the sole basis for treatment or other  patient management decisions.  A  negative result may occur with  improper specimen collection / handling, submission of specimen other  than nasopharyngeal swab, presence of viral mutation(s) within the  areas targeted by this assay, and inadequate number of viral copies  (<250 copies / mL). A negative result must be combined with clinical  observations, patient  history, and epidemiological information. If result is POSITIVE SARS-CoV-2 target nucleic acids are DETECTED.  The SARS-CoV-2 RNA is generally detectable in upper and lower  respiratory specimens during the acute phase of infection.  Positive  results are indicative of active infection with SARS-CoV-2.  Clinical  correlation with patient history and other diagnostic information is  necessary to determine patient infection status.  Positive results do  not rule out bacterial infection or co-infection with other viruses. If result is PRESUMPTIVE POSTIVE SARS-CoV-2 nucleic acids MAY BE PRESENT.   A presumptive positive result was obtained on the submitted specimen  and confirmed on repeat testing.  While 2019 novel coronavirus  (SARS-CoV-2) nucleic acids may be present in the submitted sample  additional confirmatory testing may be necessary for epidemiological  and / or clinical management purposes  to differentiate between  SARS-CoV-2 and other Sarbecovirus currently known to infect humans.  If clinically indicated additional testing with an alternate test  methodology [id number]) i s advised. The SARS-CoV-2 RNA is generally  detectable in upper and lower respiratory specimens during the acute  phase of infection. The expected result is Negative. Fact Sheet for Patients:  StrictlyIdeas.no Fact Sheet for Healthcare Providers: BankingDealers.co.za This test is not yet approved or cleared by the [place] FDA and has been authorized for detection and/or diagnosis of SARS-CoV-2 by FDA under an Emergency Use  Authorization (EUA).  This EUA will remain in effect (meaning this test can be used) for the duration of the COVID-19 declaration under Section 564(b)(1) of the Act, 21 U.S.C. section 360bbb-3(b)(1), unless the authorization is terminated or revoked sooner. Performed at [facility], [address] [place], [place] [address]   C-reactive protein     Status: Abnormal   Collection Time: 10/26/18  5:57 AM  Result Value Ref Range   CRP 2.5 (H) <1.0 mg/dL    Comment: Performed at [facility] [address] [place], [place] [address]  Lactate dehydrogenase     Status: None   Collection Time: 10/26/18  5:57 AM  Result Value Ref Range   LDH 165 98 - 192 U/L    Comment: Performed at [facility], [address] [place], [place] [address]  Ferritin     Status: None   Collection Time: 10/26/18  5:57 AM  Result Value Ref Range   Ferritin 27 11 - 307 ng/mL    Comment: Performed at [facility], [address] [place], [place] [address]  Magnesium     Status: None   Collection Time: 10/26/18  7:30 AM  Result Value Ref Range   Magnesium 2.0 1.7 - 2.4 mg/dL    Comment: Performed at [facility], [address] [place], [place] [address]  Protein, urine, random     Status: None   Collection Time: 10/26/18  9:21 AM  Result Value Ref Range   Total Protein, Urine 21 mg/dL    Comment: NO NORMAL RANGE ESTABLISHED FOR THIS TEST Performed at [facility], [place], [place] [address]     Ct Angio Chest Pe W And/or Wo Contrast  Result Date: 10/25/2018 CLINICAL DATA:  Complex chest pain EXAM: CT ANGIOGRAPHY CHEST WITH CONTRAST TECHNIQUE: Multidetector CT imaging of the chest was performed using the standard protocol during bolus administration of intravenous contrast. Multiplanar CT image reconstructions and MIPs were obtained to evaluate the vascular anatomy. CONTRAST:  91mL OMNIPAQUE IOHEXOL 350 MG/ML SOLN COMPARISON:  Chest  x-ray 10/25/2018 FINDINGS:  Cardiovascular: Satisfactory opacification of the pulmonary arteries to the segmental level. No evidence of pulmonary embolism. Nonaneurysmal aorta. No dissection seen. Heart size upper normal. No pericardial effusion. Mediastinum/Nodes: No enlarged mediastinal, hilar, or axillary lymph nodes. Thyroid gland, trachea, and esophagus demonstrate no significant findings. Lungs/Pleura: Partial consolidation in the right lower lobe consistent with a pneumonia. Small focus of airspace disease and surrounding ground-glass density in the posterior right upper lobe. Small focus of consolidation within the lateral aspect of left lower lobe. No pleural effusion. Upper Abdomen: Steatosis.  No acute abnormality Musculoskeletal: No chest wall abnormality. No acute or significant osseous findings. Review of the MIP images confirms the above findings. IMPRESSION: 1. Negative for acute pulmonary embolus or aortic dissection. 2. Partial consolidation within the right lower lobe with addition consolidations and ground-glass density in the right upper lobe and left lung base, consistent with a multifocal pneumonia. 3. Hepatic steatosis Electronically Signed   By: Jasmine PangKim  Fujinaga M.D.   On: 10/25/2018 22:08   Dg Chest Portable 1 View  Result Date: 10/25/2018 CLINICAL DATA:  28 year old female with diagnosis of coli 19 presenting with shortness of breath. EXAM: PORTABLE CHEST 1 VIEW COMPARISON:  Chest radiograph date 07/28/2017 FINDINGS: Shallow inspiration. No focal consolidation, pleural effusion, pneumothorax. Stable cardiac silhouette. No acute osseous pathology. IMPRESSION: No active disease. Electronically Signed   By: Elgie CollardArash  Radparvar M.D.   On: 10/25/2018 19:57    Current scheduled medications . B-complex with vitamin C  1 tablet Oral Daily  . cholecalciferol  1,000 Units Oral Daily  . enoxaparin (LOVENOX) injection  40 mg Subcutaneous Daily  . ferrous sulfate  325 mg Oral BID WC  . prenatal  multivitamin  1 tablet Oral Q1200  . vitamin C  500 mg Oral BID WC    I have reviewed the patient's current medications.  ASSESSMENT: G4 P2012 at 7318w2d with uncomplicated pregnancy and COVID19 + multifocal pneumonia  Patient Active Problem List   Diagnosis Date Noted  . Shortness of breath during pregnancy 10/26/2018  . Supervision of other normal pregnancy, antepartum 09/09/2018  . History of postpartum hemorrhage 09/09/2018  . Hx of preeclampsia, prior pregnancy, currently pregnant 09/09/2018  . Adjustment disorder 09/09/2018    PLAN: Lovenox for DVT prophylaxis per Dr Feliberto GottronSchermerhorn and hospitalist team Management of COVID + pneumonia per hospitalist and pulmonology.  Continue routine antenatal care with daily NST. Reactive NST now.    Randa NgoRebecca A Cariana Karge, CNM 10/26/2018  4:26 PM

## 2018-10-26 NOTE — Consult Note (Signed)
Pulmonary Medicine          Date: 10/26/2018,   MRN# [id number] Jerl SantosJosefina M Lopez Torres Jul 09, 1990     AdmissionWeight: 60.8 kg                 CurrentWeight: 60.8 kg      CHIEF COMPLAINT:   Acute hypoxemia in pregnant female with COVID19 pneumonia   HISTORY OF PRESENT ILLNESS   Sola Cleone SlimLopez Torres  is a 28 y.o. female diagnosed with COVID-19 virus on 10/17/2018. Patient was apparently aysmptomatic but her boyfriend was diagnosed with covid so she was also tested and found to be positive. She did eventually develop mild sob after 1 week post positive test result.   She then presented to the emergency room complaining of a 4-day history of symptoms including increased shortness of breath and nonproductive cough as well as low-grade subjective fevers and chills.  Over the last 12 hours, shortness of breath has become worse now present with minimal exertion such as ambulating from room to room in her home.  She has noted no increased edema of her extremities.  She has no prior history of DVT or pulmonary embolism.  She denies nausea or vomiting.  She denies abdominal pain. Patient did have CTPE protocol which was negative for pulmonary VTE but did show mild ground glass attenuation bilaterally with RLL consolidated infiltrate suggestive of pneumonia. She is on room air with sPO2 98%.  Pulmonary consultation was placed by Dr Hilton SinclairWeiting for any additional recommendations in this pregnant female with COIVD19 pneumonia.   PAST MEDICAL HISTORY   Past Medical History:  Diagnosis Date  . Anemia      SURGICAL HISTORY   Past Surgical History:  Procedure Laterality Date  . DILATION AND EVACUATION N/A 02/09/2015   Procedure: DILATATION AND EVACUATION;  Surgeon: Christeen DouglasBethany Beasley, MD;  Location: [facility] ORS;  Service: Gynecology;  Laterality: N/A;  . LAPAROSCOPY  02/09/2015   Procedure: LAPAROSCOPY DIAGNOSTIC;  Surgeon: Christeen DouglasBethany Beasley, MD;  Location: [facility] ORS;  Service: Gynecology;;     FAMILY HISTORY   History reviewed. No pertinent family history.   SOCIAL HISTORY   Social History   Tobacco Use  . Smoking status: Never Smoker  . Smokeless tobacco: Never Used  Substance Use Topics  . Alcohol use: Not Currently  . Drug use: No     MEDICATIONS    Home Medication:    Current Medication:  Current Facility-Administered Medications:  .  acetaminophen (TYLENOL) tablet 650 mg, 650 mg, Oral, Q4H PRN, McVey, Rebecca A, CNM .  albuterol (VENTOLIN HFA) 108 (90 Base) MCG/ACT inhaler 1 puff, 1 puff, Inhalation, Q6H PRN, Seals, Angela H, NP .  azithromycin (ZITHROMAX) 500 mg in sodium chloride 0.9 % 250 mL IVPB, 500 mg, Intravenous, Q24H, Seals, Angela H, NP .  B-complex with vitamin C tablet 1 tablet, 1 tablet, Oral, Daily, Wieting, Richard, MD .  cefTRIAXone (ROCEPHIN) 2 g in sodium chloride 0.9 % 100 mL IVPB, 2 g, Intravenous, Q24H, Seals, Angela H, NP, Last Rate: 200 mL/hr at 10/26/18 0636, 2 g at 10/26/18 0636 .  cholecalciferol (VITAMIN D3) tablet 1,000 Units, 1,000 Units, Oral, Daily, Wieting, Richard, MD .  docusate sodium (COLACE) capsule 100 mg, 100 mg, Oral, BID PRN, McVey, Prudencio Pairebecca A, CNM .  ferrous sulfate tablet 325 mg, 325 mg, Oral, BID WC, McVey, Rebecca A, CNM .  prenatal multivitamin tablet 1 tablet, 1 tablet, Oral, Q1200, McVey, Rebecca A, CNM .  vitamin C (  ASCORBIC ACID) tablet 500 mg, 500 mg, Oral, BID WC, McVey, Rebecca A, CNM    ALLERGIES   Patient has no known allergies.     REVIEW OF SYSTEMS    Review of Systems:  ROS did not conduct due to COVID19 precautions  Other:  All other systems negative   VS: BP 95/63 (BP Location: Right Arm)   Pulse 60   Temp 98 F (36.7 C) (Oral)   Resp (!) 24   Ht 5' (1.524 m)   Wt 60.8 kg   LMP 04/25/2018   SpO2 98%   BMI 26.17 kg/m    COVID-19 DISASTER DECLARATION:   FULL CONTACT PHYSICAL EXAMINATION WAS NOT POSSIBLE DUE TO TREATMENT OF COVID-19  AND CONSERVATION OF PERSONAL  PROTECTIVE EQUIPMENT, LIMITED EXAM FINDINGS INCLUDE-    Patient assessed or the symptoms described in the history of present illness.  In the context of the Global COVID-19 pandemic, which necessitated consideration that the patient might be at risk for infection with the SARS-CoV-2 virus that causes COVID-19, Institutional protocols and algorithms that pertain to the evaluation of patients at risk for COVID-19 are in a state of rapid change based on information released by regulatory bodies including the CDC and federal and state organizations. These policies and algorithms were followed during the patient's care while in hospital.   PHYSICAL EXAM    GENERAL:NAD, no fevers, chills HEAD: Normocephalic, atraumatic.  EYES: opening eyes spontaneously MOUTH: able to speak in full sentences EAR, NOSE, THROAT: . No external lesions.  NECK: Supple. Full cervical rom PULMONARY: normal respiratory effort CARDIOVASCULAR: S1S2 was not auscultated GASTROINTESTINAL: distended pregnant abdome MUSCULOSKELETAL: No swelling, clubbing, or edema. Range of motion full in all extremities.  NEUROLOGIC:No gross focal neurological deficits.  SKIN: No ulceration, lesions, rashes, or cyanosis. PSYCHIATRIC: Mood, affect within normal limits. The patient is awake, alert       IMAGING    Ct Angio Chest Pe W And/or Wo Contrast  Result Date: 10/25/2018 CLINICAL DATA:  Complex chest pain EXAM: CT ANGIOGRAPHY CHEST WITH CONTRAST TECHNIQUE: Multidetector CT imaging of the chest was performed using the standard protocol during bolus administration of intravenous contrast. Multiplanar CT image reconstructions and MIPs were obtained to evaluate the vascular anatomy. CONTRAST:  26mL OMNIPAQUE IOHEXOL 350 MG/ML SOLN COMPARISON:  Chest x-ray 10/25/2018 FINDINGS: Cardiovascular: Satisfactory opacification of the pulmonary arteries to the segmental level. No evidence of pulmonary embolism. Nonaneurysmal aorta. No dissection  seen. Heart size upper normal. No pericardial effusion. Mediastinum/Nodes: No enlarged mediastinal, hilar, or axillary lymph nodes. Thyroid gland, trachea, and esophagus demonstrate no significant findings. Lungs/Pleura: Partial consolidation in the right lower lobe consistent with a pneumonia. Small focus of airspace disease and surrounding ground-glass density in the posterior right upper lobe. Small focus of consolidation within the lateral aspect of left lower lobe. No pleural effusion. Upper Abdomen: Steatosis.  No acute abnormality Musculoskeletal: No chest wall abnormality. No acute or significant osseous findings. Review of the MIP images confirms the above findings. IMPRESSION: 1. Negative for acute pulmonary embolus or aortic dissection. 2. Partial consolidation within the right lower lobe with addition consolidations and ground-glass density in the right upper lobe and left lung base, consistent with a multifocal pneumonia. 3. Hepatic steatosis Electronically Signed   By: Donavan Foil M.D.   On: 10/25/2018 22:08   Dg Chest Portable 1 View  Result Date: 10/25/2018 CLINICAL DATA:  28 year old female with diagnosis of coli 19 presenting with shortness of breath. EXAM: PORTABLE CHEST 1  VIEW COMPARISON:  Chest radiograph date 07/28/2017 FINDINGS: Shallow inspiration. No focal consolidation, pleural effusion, pneumothorax. Stable cardiac silhouette. No acute osseous pathology. IMPRESSION: No active disease. Electronically Signed   By: Elgie Collard M.D.   On: 10/25/2018 19:57      ASSESSMENT/PLAN   Pneumonia due to COVID19 infection.  - [redacted]wk pregnant -patient is not hypoxemic currently, spO2-98% on room air, BP is not elevated 105/68 -magnesium level today -urine protein today - monitor vital signs and sPO2 - potential for decompensation in 2nd and 3rd week of COVID infection - no need for Convalescent plasma, Actemra or Remdesevir at this time due to fetal effects on animal studies only  and patient clinically with mild symptoms with no hypoxemia - agree with empiric Rocephin/zithromax - patient has higher chance of placental and systemic thrombosis , will defer to OBGYN for DVT prophylaxis - would recommend incentive spirometry to be done 10X/hr      Thank you for allowing me to participate in the care of this patient.   Patient/Family are satisfied with care plan and all questions have been answered.  This document was prepared using Dragon voice recognition software and may include unintentional dictation errors.     Vida Rigger, M.D.  Division of Pulmonary & Critical Care Medicine  [facility]

## 2018-10-26 NOTE — ED Notes (Signed)
Dr. Magda Kiel and RN from [facility] in room to connect baby monitor to patient. Bed assigned for med surg but RN called with information about a delay regarding negative pressure room. Patient informed and updated.

## 2018-10-26 NOTE — Progress Notes (Signed)
Patient ID: Jeanne [name], female   DOB: 1990/09/24, 28 y.o.   MRN: [id number]  Chart reviewed, including consult note by Gardiner Barefoot, laboratory data reviewed.  Appreciate pulmonary consultation.  Supportive care.  Empiric antibiotics with Rocephin and Zithromax.  Watch respiratory status closely.  Current pulse ox acceptable.  Discussed with OB/GYN and okay to give Lovenox daily.  Dr Loletha Grayer

## 2018-10-27 DIAGNOSIS — Z8616 Personal history of COVID-19: Secondary | ICD-10-CM

## 2018-10-27 DIAGNOSIS — U071 COVID-19: Secondary | ICD-10-CM

## 2018-10-27 HISTORY — DX: Personal history of COVID-19: Z86.16

## 2018-10-27 LAB — COMPREHENSIVE METABOLIC PANEL
ALT: 20 U/L (ref 0–44)
AST: 24 U/L (ref 15–41)
Albumin: 2.4 g/dL — ABNORMAL LOW (ref 3.5–5.0)
Alkaline Phosphatase: 88 U/L (ref 38–126)
Anion gap: 9 (ref 5–15)
BUN: 9 mg/dL (ref 6–20)
CO2: 20 mmol/L — ABNORMAL LOW (ref 22–32)
Calcium: 7.9 mg/dL — ABNORMAL LOW (ref 8.9–10.3)
Chloride: 109 mmol/L (ref 98–111)
Creatinine, Ser: 0.45 mg/dL (ref 0.44–1.00)
GFR calc Af Amer: >60 mL/min (ref >60)
GFR calc non Af Amer: >60 mL/min (ref >60)
Glucose, Bld: 95 mg/dL (ref 70–99)
Potassium: 3.3 mmol/L — ABNORMAL LOW (ref 3.5–5.1)
Sodium: 138 mmol/L (ref 135–145)
Total Bilirubin: 0.4 mg/dL (ref 0.3–1.2)
Total Protein: 5.7 g/dL — ABNORMAL LOW (ref 6.5–8.1)

## 2018-10-27 LAB — CBC WITH DIFFERENTIAL/PLATELET
Abs Immature Granulocytes: 0.23 10*3/uL — ABNORMAL HIGH (ref 0.00–0.07)
Basophils Absolute: 0 10*3/uL (ref 0.0–0.1)
Basophils Relative: 0 %
Eosinophils Absolute: 0.1 10*3/uL (ref 0.0–0.5)
Eosinophils Relative: 2 %
HCT: 29.9 % — ABNORMAL LOW (ref 36.0–46.0)
Hemoglobin: 9.6 g/dL — ABNORMAL LOW (ref 12.0–15.0)
Immature Granulocytes: 5 %
Lymphocytes Relative: 29 %
Lymphs Abs: 1.5 10*3/uL (ref 0.7–4.0)
MCH: 28.8 pg (ref 26.0–34.0)
MCHC: 32.1 g/dL (ref 30.0–36.0)
MCV: 89.8 fL (ref 80.0–100.0)
Monocytes Absolute: 0.3 10*3/uL (ref 0.1–1.0)
Monocytes Relative: 6 %
Neutro Abs: 2.9 10*3/uL (ref 1.7–7.7)
Neutrophils Relative %: 58 %
Platelets: 242 10*3/uL (ref 150–400)
RBC: 3.33 MIL/uL — ABNORMAL LOW (ref 3.87–5.11)
RDW: 13.2 % (ref 11.5–15.5)
WBC: 5 10*3/uL (ref 4.0–10.5)
nRBC: 0 % (ref 0.0–0.2)

## 2018-10-27 MED ORDER — B COMPLEX-C PO TABS: 1.0000 | ORAL_TABLET | Freq: Every day | ORAL | 0 refills | Status: DC

## 2018-10-27 MED ORDER — SODIUM CHLORIDE 0.9 % IV SOLN
2.0000 g | INTRAVENOUS | Status: DC
  Administered 2018-10-27: 2 g via INTRAVENOUS
  Filled 2018-10-27: qty 20

## 2018-10-27 MED ORDER — SODIUM CHLORIDE 0.9 % IV SOLN
500.0000 mg | INTRAVENOUS | Status: DC
  Administered 2018-10-27: 06:00:00 500 mg via INTRAVENOUS
  Filled 2018-10-27: qty 500

## 2018-10-27 MED ORDER — ALBUTEROL SULFATE HFA 108 (90 BASE) MCG/ACT IN AERS: 2.0000 | INHALATION_SPRAY | Freq: Four times a day (QID) | RESPIRATORY_TRACT | 0 refills | Status: DC | PRN

## 2018-10-27 MED ORDER — FERROUS SULFATE 325 (65 FE) MG PO TABS: 325.0000 mg | ORAL_TABLET | Freq: Every day | ORAL | 0 refills | Status: DC

## 2018-10-27 MED ORDER — AZITHROMYCIN 250 MG PO TABS: ORAL_TABLET | ORAL | 0 refills | Status: DC

## 2018-10-27 MED ORDER — ASCORBIC ACID 500 MG PO TABS: 500.0000 mg | ORAL_TABLET | Freq: Two times a day (BID) | ORAL | 0 refills | Status: DC

## 2018-10-27 MED ORDER — CEFDINIR 300 MG PO CAPS: 300.0000 mg | ORAL_CAPSULE | Freq: Two times a day (BID) | ORAL | 0 refills | Status: DC

## 2018-10-27 NOTE — Progress Notes (Signed)
Patient ID: Jullisa Grigoryan, female   DOB: Sep 20, 1990, 28 y.o.   MRN: [id number]  [employer] PROGRESS NOTE  Starletta Houchin PYK:[id number] DOB: 1991-02-20 DOA: 10/25/2018 PCP: Department, [facility]  HPI/Subjective: Patient feeling much better.  No trouble breathing, no cough.  Objective: Vitals:   10/27/18 0533 10/27/18 0917  BP: (!) 95/55 (!) 92/56  Pulse: 64 (!) 58  Resp: 18   Temp: 98.3 F (36.8 C) 98.4 F (36.9 C)  SpO2: 98% 99%    Filed Weights   10/25/18 1832  Weight: 60.8 kg    ROS: Review of Systems  Constitutional: Negative for chills and fever.  Eyes: Negative for blurred vision.  Respiratory: Negative for cough, shortness of breath and wheezing.   Cardiovascular: Negative for chest pain.  Gastrointestinal: Negative for abdominal pain, constipation, diarrhea, nausea and vomiting.  Genitourinary: Negative for dysuria.  Musculoskeletal: Negative for joint pain.  Neurological: Negative for dizziness and headaches.   Exam: Physical Exam  Constitutional: She is oriented to person, place, and time.  HENT:  Nose: No mucosal edema.  Mouth/Throat: No oropharyngeal exudate or posterior oropharyngeal edema.  Eyes: Pupils are equal, round, and reactive to light. Conjunctivae, EOM and lids are normal.  Neck: No JVD present. Carotid bruit is not present. No edema present. No thyroid mass and no thyromegaly present.  Cardiovascular: S1 normal and S2 normal. Exam reveals no gallop.  No murmur heard. Pulses:      Dorsalis pedis pulses are 2+ on the right side and 2+ on the left side.  Respiratory: No respiratory distress. She has decreased breath sounds in the right lower field and the left lower field. She has no wheezes. She has no rhonchi. She has no rales.  GI: Soft. Bowel sounds are normal. There is no abdominal tenderness.  Musculoskeletal:     Right ankle: She exhibits no swelling.     Left ankle: She exhibits no swelling.   Lymphadenopathy:    She has no cervical adenopathy.  Neurological: She is alert and oriented to person, place, and time. No cranial nerve deficit.  Skin: Skin is warm. No rash noted. Nails show no clubbing.  Psychiatric: She has a normal mood and affect.      Data Reviewed: Basic Metabolic Panel: Recent Labs  Lab 10/25/18 2015 10/26/18 0730 10/27/18 0737  NA 135  --  138  K 4.2  --  3.3*  CL 105  --  109  CO2 21*  --  20*  GLUCOSE 89  --  95  BUN 9  --  9  CREATININE 0.33*  --  0.45  CALCIUM 8.9  --  7.9*  MG  --  2.0  --    Liver Function Tests: Recent Labs  Lab 10/27/18 0737  AST 24  ALT 20  ALKPHOS 88  BILITOT 0.4  PROT 5.7*  ALBUMIN 2.4*   CBC: Recent Labs  Lab 10/25/18 2042 10/27/18 0737  WBC 4.1 5.0  NEUTROABS 2.5 2.9  HGB 10.8* 9.6*  HCT 32.7* 29.9*  MCV 88.1 89.8  PLT 259 242     Recent Results (from the past 240 hour(s))  SARS Coronavirus 2 [facility] order, Performed in [facility] hospital lab) Nasopharyngeal Nasopharyngeal Swab     Status: Abnormal   Collection Time: 10/25/18 11:24 PM   Specimen: Nasopharyngeal Swab  Result Value Ref Range Status   SARS Coronavirus 2 POSITIVE (A) NEGATIVE Final    Comment: RESULT CALLED TO, READ BACK  BY AND VERIFIED WITH: LEA FERGUSON 10/26/2018 AT 0045 BY HS (NOTE) If result is NEGATIVE SARS-CoV-2 target nucleic acids are NOT DETECTED. The SARS-CoV-2 RNA is generally detectable in upper and lower  respiratory specimens during the acute phase of infection. The lowest  concentration of SARS-CoV-2 viral copies this assay can detect is 250  copies / mL. A negative result does not preclude SARS-CoV-2 infection  and should not be used as the sole basis for treatment or other  patient management decisions.  A negative result may occur with  improper specimen collection / handling, submission of specimen other  than nasopharyngeal swab, presence of viral mutation(s) within the  areas targeted by this assay,  and inadequate number of viral copies  (<250 copies / mL). A negative result must be combined with clinical  observations, patient history, and epidemiological information. If result is POSITIVE SARS-CoV-2 target nucleic acids are DETECTED.  The SARS-CoV-2 RNA is generally detectable in upper and lower  respiratory specimens during the acute phase of infection.  Positive  results are indicative of active infection with SARS-CoV-2.  Clinical  correlation with patient history and other diagnostic information is  necessary to determine patient infection status.  Positive results do  not rule out bacterial infection or co-infection with other viruses. If result is PRESUMPTIVE POSTIVE SARS-CoV-2 nucleic acids MAY BE PRESENT.   A presumptive positive result was obtained on the submitted specimen  and confirmed on repeat testing.  While 2019 novel coronavirus  (SARS-CoV-2) nucleic acids may be present in the submitted sample  additional confirmatory testing may be necessary for epidemiological  and / or clinical management purposes  to differentiate between  SARS-CoV-2 and other Sarbecovirus currently known to infect humans.  If clinically indicated additional testing with an alternate test  methodology [phone]) i s advised. The SARS-CoV-2 RNA is generally  detectable in upper and lower respiratory specimens during the acute  phase of infection. The expected result is Negative. Fact Sheet for Patients:  BoilerBrush.com.cy Fact Sheet for Healthcare Providers: https://pope.com/ This test is not yet approved or cleared by the [place] FDA and has been authorized for detection and/or diagnosis of SARS-CoV-2 by FDA under an Emergency Use Authorization (EUA).  This EUA will remain in effect (meaning this test can be used) for the duration of the COVID-19 declaration under Section 564(b)(1) of the Act, 21 U.S.C. section 360bbb-3(b)(1), unless  the authorization is terminated or revoked sooner. Performed at [facility], [address] [place], [place] [address]      Studies: Ct Angio Chest Pe W And/or Wo Contrast  Result Date: 10/25/2018 CLINICAL DATA:  Complex chest pain EXAM: CT ANGIOGRAPHY CHEST WITH CONTRAST TECHNIQUE: Multidetector CT imaging of the chest was performed using the standard protocol during bolus administration of intravenous contrast. Multiplanar CT image reconstructions and MIPs were obtained to evaluate the vascular anatomy. CONTRAST:  5mL OMNIPAQUE IOHEXOL 350 MG/ML SOLN COMPARISON:  Chest x-ray 10/25/2018 FINDINGS: Cardiovascular: Satisfactory opacification of the pulmonary arteries to the segmental level. No evidence of pulmonary embolism. Nonaneurysmal aorta. No dissection seen. Heart size upper normal. No pericardial effusion. Mediastinum/Nodes: No enlarged mediastinal, hilar, or axillary lymph nodes. Thyroid gland, trachea, and esophagus demonstrate no significant findings. Lungs/Pleura: Partial consolidation in the right lower lobe consistent with a pneumonia. Small focus of airspace disease and surrounding ground-glass density in the posterior right upper lobe. Small focus of consolidation within the lateral aspect of left lower lobe. No pleural effusion. Upper Abdomen: Steatosis.  No  acute abnormality Musculoskeletal: No chest wall abnormality. No acute or significant osseous findings. Review of the MIP images confirms the above findings. IMPRESSION: 1. Negative for acute pulmonary embolus or aortic dissection. 2. Partial consolidation within the right lower lobe with addition consolidations and ground-glass density in the right upper lobe and left lung base, consistent with a multifocal pneumonia. 3. Hepatic steatosis Electronically Signed   By: Jasmine PangKim  Fujinaga M.D.   On: 10/25/2018 22:08   Dg Chest Portable 1 View  Result Date: 10/25/2018 CLINICAL DATA:  28 year old female with diagnosis of coli 19  presenting with shortness of breath. EXAM: PORTABLE CHEST 1 VIEW COMPARISON:  Chest radiograph date 07/28/2017 FINDINGS: Shallow inspiration. No focal consolidation, pleural effusion, pneumothorax. Stable cardiac silhouette. No acute osseous pathology. IMPRESSION: No active disease. Electronically Signed   By: Elgie CollardArash  Radparvar M.D.   On: 10/25/2018 19:57    Scheduled Meds: . B-complex with vitamin C  1 tablet Oral Daily  . cholecalciferol  1,000 Units Oral Daily  . enoxaparin (LOVENOX) injection  40 mg Subcutaneous Daily  . ferrous sulfate  325 mg Oral BID WC  . prenatal multivitamin  1 tablet Oral Q1200  . vitamin C  500 mg Oral BID WC   Continuous Infusions: . azithromycin 500 mg (10/27/18 16100608)  . cefTRIAXone (ROCEPHIN)  IV 2 g (10/27/18 0529)    Assessment/Plan:  1. Covid 19 positive with shortness of breath.  Pneumonia multifocal.  Patient not hypoxic with walking. Patient feeling much better, not having fever or other symptoms and wants to go home. Case discussed with pulmonary and recomended aspirin upon discharge. Finishing up course of antibiotics. 2. Pregnancy. Seen by gynecology.  F/u at health department in 10days if afebrile.  Code Status:     Code Status Orders  (From admission, onward)         Start     Ordered   10/26/18 0109  Full code  Continuous     10/26/18 0114        Code Status History    Date Active Date Inactive Code Status Order ID Comments User Context   07/28/2017 0432 07/29/2017 1438 Full Code [id number]  Arnaldo Nataliamond, Michael S, MD Inpatient   10/17/2016 2039 10/18/2016 2228 Full Code [id number]  Ward, Elenora Fenderhelsea C, MD Inpatient   10/17/2016 0805 10/17/2016 2039 Full Code [id number]  Ward, Elenora Fenderhelsea C, MD Inpatient   10/17/2016 0612 10/17/2016 0805 Full Code [id number]  Candyce ChurnNorris, Stephanie, RN Inpatient   Advance Care Planning Activity     Disposition Plan: home today   Time spent: 31 minutes in coordination of care in speaking with pulmonary and  gynecology  Alford Highlandichard Rykin Route  [employer]

## 2018-10-27 NOTE — Progress Notes (Signed)
Patient is being discharge home as per order, discharge instruction provided, iv removed tele removed , medication and follow up appointment reviewed with patient

## 2018-10-27 NOTE — Progress Notes (Signed)
Pulmonary Medicine          Date: 10/27/2018,   MRN# [id number] Jacci Ruberg 1990/05/04     AdmissionWeight: 60.8 kg                 CurrentWeight: 60.8 kg      CHIEF COMPLAINT:   Acute hypoxemia in pregnant female with COVID19 pneumonia   SUBJECTIVE    Patient is hemodynamically stable with 99% oxygen saturation on room air. Plan is to d/c home today with self quarantine.  Would recommend ASA81mg  on outpatient basis and follow up with primary care and obgyn.   PAST MEDICAL HISTORY   Past Medical History:  Diagnosis Date   Anemia      SURGICAL HISTORY   Past Surgical History:  Procedure Laterality Date   DILATION AND EVACUATION N/A 02/09/2015   Procedure: DILATATION AND EVACUATION;  Surgeon: Benjaman Kindler, MD;  Location: [facility] ORS;  Service: Gynecology;  Laterality: N/A;   LAPAROSCOPY  02/09/2015   Procedure: LAPAROSCOPY DIAGNOSTIC;  Surgeon: Benjaman Kindler, MD;  Location: [facility] ORS;  Service: Gynecology;;     FAMILY HISTORY   History reviewed. No pertinent family history.   SOCIAL HISTORY   Social History   Tobacco Use   Smoking status: Never Smoker   Smokeless tobacco: Never Used  Substance Use Topics   Alcohol use: Not Currently   Drug use: No     MEDICATIONS    Home Medication:    Current Medication:  Current Facility-Administered Medications:    acetaminophen (TYLENOL) tablet 650 mg, 650 mg, Oral, Q4H PRN, McVey, Rebecca A, CNM   albuterol (VENTOLIN HFA) 108 (90 Base) MCG/ACT inhaler 1 puff, 1 puff, Inhalation, Q6H PRN, Seals, Theo Dills, NP   azithromycin (ZITHROMAX) 500 mg in sodium chloride 0.9 % 250 mL IVPB, 500 mg, Intravenous, Q24H, Wieting, Richard, MD, Last Rate: 250 mL/hr at 10/27/18 0608, 500 mg at 10/27/18 0608   B-complex with vitamin C tablet 1 tablet, 1 tablet, Oral, Daily, Wieting, Richard, MD, 1 tablet at 10/27/18 0919   cefTRIAXone (ROCEPHIN) 2 g in sodium chloride 0.9 % 100 mL IVPB, 2 g,  Intravenous, Q24H, Wieting, Richard, MD, Last Rate: 200 mL/hr at 10/27/18 0529, 2 g at 10/27/18 1884   cholecalciferol (VITAMIN D3) tablet 1,000 Units, 1,000 Units, Oral, Daily, Wieting, Richard, MD, 1,000 Units at 10/27/18 0920   docusate sodium (COLACE) capsule 100 mg, 100 mg, Oral, BID PRN, McVey, Rebecca A, CNM   enoxaparin (LOVENOX) injection 40 mg, 40 mg, Subcutaneous, Daily, Wieting, Richard, MD, 40 mg at 10/27/18 0920   ferrous sulfate tablet 325 mg, 325 mg, Oral, BID WC, McVey, Rebecca A, CNM, 325 mg at 10/27/18 0919   prenatal multivitamin tablet 1 tablet, 1 tablet, Oral, Q1200, McVey, Rebecca A, CNM, 1 tablet at 10/27/18 1660   vitamin C (ASCORBIC ACID) tablet 500 mg, 500 mg, Oral, BID WC, McVey, Rebecca A, CNM, 500 mg at 10/27/18 0919    ALLERGIES   Patient has no known allergies.     REVIEW OF SYSTEMS    Review of Systems:  ROS did not conduct due to COVID19 precautions  Other:  All other systems negative   VS: BP (!) 92/56 (BP Location: Right Arm)    Pulse (!) 58    Temp 98.4 F (36.9 C) (Oral)    Resp 18    Ht 5' (1.524 m)    Wt 60.8 kg    LMP 04/25/2018  SpO2 99%    BMI 26.17 kg/m    COVID-19 DISASTER DECLARATION:   FULL CONTACT PHYSICAL EXAMINATION WAS NOT POSSIBLE DUE TO TREATMENT OF COVID-19  AND CONSERVATION OF PERSONAL PROTECTIVE EQUIPMENT, LIMITED EXAM FINDINGS INCLUDE-    Patient assessed or the symptoms described in the history of present illness.  In the context of the Global COVID-19 pandemic, which necessitated consideration that the patient might be at risk for infection with the SARS-CoV-2 virus that causes COVID-19, Institutional protocols and algorithms that pertain to the evaluation of patients at risk for COVID-19 are in a state of rapid change based on information released by regulatory bodies including the CDC and federal and state organizations. These policies and algorithms were followed during the patient's care while in  hospital.   PHYSICAL EXAM    GENERAL:NAD, no fevers, chills HEAD: Normocephalic, atraumatic.  EYES: opening eyes spontaneously MOUTH: able to speak in full sentences EAR, NOSE, THROAT: . No external lesions.  NECK: Supple. Full cervical rom PULMONARY: normal respiratory effort CARDIOVASCULAR: S1S2 was not auscultated GASTROINTESTINAL: distended pregnant abdome MUSCULOSKELETAL: No swelling, clubbing, or edema. Range of motion full in all extremities.  NEUROLOGIC:No gross focal neurological deficits.  SKIN: No ulceration, lesions, rashes, or cyanosis. PSYCHIATRIC: Mood, affect within normal limits. The patient is awake, alert       IMAGING    Ct Angio Chest Pe W And/or Wo Contrast  Result Date: 10/25/2018 CLINICAL DATA:  Complex chest pain EXAM: CT ANGIOGRAPHY CHEST WITH CONTRAST TECHNIQUE: Multidetector CT imaging of the chest was performed using the standard protocol during bolus administration of intravenous contrast. Multiplanar CT image reconstructions and MIPs were obtained to evaluate the vascular anatomy. CONTRAST:  60mL OMNIPAQUE IOHEXOL 350 MG/ML SOLN COMPARISON:  Chest x-ray 10/25/2018 FINDINGS: Cardiovascular: Satisfactory opacification of the pulmonary arteries to the segmental level. No evidence of pulmonary embolism. Nonaneurysmal aorta. No dissection seen. Heart size upper normal. No pericardial effusion. Mediastinum/Nodes: No enlarged mediastinal, hilar, or axillary lymph nodes. Thyroid gland, trachea, and esophagus demonstrate no significant findings. Lungs/Pleura: Partial consolidation in the right lower lobe consistent with a pneumonia. Small focus of airspace disease and surrounding ground-glass density in the posterior right upper lobe. Small focus of consolidation within the lateral aspect of left lower lobe. No pleural effusion. Upper Abdomen: Steatosis.  No acute abnormality Musculoskeletal: No chest wall abnormality. No acute or significant osseous findings. Review  of the MIP images confirms the above findings. IMPRESSION: 1. Negative for acute pulmonary embolus or aortic dissection. 2. Partial consolidation within the right lower lobe with addition consolidations and ground-glass density in the right upper lobe and left lung base, consistent with a multifocal pneumonia. 3. Hepatic steatosis Electronically Signed   By: Jasmine Pang M.D.   On: 10/25/2018 22:08   Dg Chest Portable 1 View  Result Date: 10/25/2018 CLINICAL DATA:  28 year old female with diagnosis of coli 19 presenting with shortness of breath. EXAM: PORTABLE CHEST 1 VIEW COMPARISON:  Chest radiograph date 07/28/2017 FINDINGS: Shallow inspiration. No focal consolidation, pleural effusion, pneumothorax. Stable cardiac silhouette. No acute osseous pathology. IMPRESSION: No active disease. Electronically Signed   By: Elgie Collard M.D.   On: 10/25/2018 19:57      ASSESSMENT/PLAN   Pneumonia due to COVID19 infection.  - [redacted]wk pregnant -patient is not hypoxemic currently, spO2-98% on room air, BP is not elevated 105/68 -magnesium level today- normal -urine protein today- normal - monitor vital signs and sPO2 - potential for decompensation in 2nd and 3rd week of  COVID infection - no need for Convalescent plasma, Actemra or Remdesevir at this time  - agree with empiric Rocephin/zithromax-can finish 5 d abx on op basis, discussed with Dr Renae GlossWieting.  - patient has higher chance of placental and systemic thrombosis , will defer to OBGYN for DVT prophylaxis - would recommend incentive spirometry to be done 10X/hr      Thank you for allowing me to participate in the care of this patient.   Patient/Family are satisfied with care plan and all questions have been answered.  This document was prepared using Dragon voice recognition software and may include unintentional dictation errors.     Vida RiggerFuad Adelynne Joerger, M.D.  Division of Pulmonary & Critical Care Medicine  [facility]

## 2018-10-27 NOTE — Progress Notes (Signed)
Patient ID: Jeanne [name]  The patient was admitted to [facility] on 10/25/2018 and discharged 10/27/2018.  Mylan was taking care of her kids and should be excused from work for this time period.  Dr. Loletha Grayer [phone]

## 2018-10-27 NOTE — Discharge Summary (Signed)
Obstetrical Discharge Summary  Patient Name: Jeanne [name] DOB: [birth year] MRN: [id number]  Date of Admission: 10/25/2018 Date of Discharge: 10/27/2018  Primary OB: [facility] [id number] last menstrual period was 04/25/2018. EDC Estimated Date of Delivery: 01/30/19 Gestational Age at Delivery: [redacted]w[redacted]d   Antepartum complications: + covid and SOB  Admitting Diagnosis: + covid and SOB  Secondary Diagnosis: Patient Active Problem List   Diagnosis Date Noted  . COVID-19 virus detected 10/27/2018  . Shortness of breath during pregnancy 10/26/2018  . Supervision of other normal pregnancy, antepartum 09/09/2018  . History of postpartum hemorrhage 09/09/2018  . Hx of preeclampsia, prior pregnancy, currently pregnant 09/09/2018  . Adjustment disorder 09/09/2018   Hospital course :  Pt was admitted through the ED after presenting with + covid and SOB . [redacted] week EGA   CTSCAN performed which did not show pneumonia or PE . Pts o2 saturation dropped to 88% which prompted admission  She was given 125 mg Solumedrol and was placed on Azithromycin and Rocephin. Pulmonary consult done which agreed with management . She was placed on prophylactic Lovenox for DVT / PE prevention  Daily fetal monitoring was reassuring . HD #2 clinically she was improving and is d/c home . She will continue 81 mg asa and azithromycin and omnicef   Discharge Physical Exam: by Dr Leslye Peer BP (!) 92/56 (BP Location: Right Arm)   Pulse (!) 58   Temp 98.4 F (36.9 C) (Oral)   Resp 18   Ht 5' (1.524 m)   Wt 60.8 kg   LMP 04/25/2018   SpO2 99%   BMI 26.17 kg/m   Physical Exam  Constitutional: She is oriented to person, place, and time.  HENT:  Nose: No mucosal edema.  Mouth/Throat: No oropharyngeal exudate or posterior oropharyngeal edema.  Eyes: Pupils are equal, round, and reactive to light. Conjunctivae, EOM and lids are normal.  Neck: No JVD present. Carotid bruit is not present. No edema present. No thyroid  mass and no thyromegaly present.  Cardiovascular: S1 normal and S2 normal. Exam reveals no gallop.  No murmur heard. Pulses:      Dorsalis pedis pulses are 2+ on the right side and 2+ on the left side.  Respiratory: No respiratory distress. She has decreased breath sounds in the right lower field and the left lower field. She has no wheezes. She has no rhonchi. She has no rales.  GI: Soft. Bowel sounds are normal. There is no abdominal tenderness.  Musculoskeletal:     Right ankle: She exhibits no swelling.     Left ankle: She exhibits no swelling.  Lymphadenopathy:    She has no cervical adenopathy.  Neurological: She is alert and oriented to person, place, and time. No cranial nerve deficit.  Skin: Skin is warm. No rash noted. Nails show no clubbing.  Psychiatric: She has a normal mood and affect.      Hemoglobin  Date Value Ref Range Status  10/27/2018 9.6 (L) 12.0 - 15.0 g/dL Final  07/11/2018 12.5  Final   HCT  Date Value Ref Range Status  10/27/2018 29.9 (L) 36.0 - 46.0 % Final  01/08/2013 21.9 (L) 35.0 - 47.0 % Final       Prenatal Labs:     Plan:  Merin Borjon was discharged to home Follow up with [facility] in > 10 days when asymptomatic and afebrile   Discharge Medications: Allergies as of 10/27/2018   No Known Allergies     Medication List  TAKE these medications   albuterol 108 (90 Base) MCG/ACT inhaler Commonly known as: VENTOLIN HFA Inhale 2 puffs into the lungs every 6 (six) hours as needed for wheezing or shortness of breath.   ascorbic acid 500 MG tablet Commonly known as: VITAMIN C Take 1 tablet (500 mg total) by mouth 2 (two) times daily with a meal.   aspirin EC 81 MG tablet Take 81 mg by mouth daily. Preeclampsia prevention   azithromycin 250 MG tablet Commonly known as: Zithromax One tab po daily for three more days   B-complex with vitamin C tablet Take 1 tablet by mouth daily. Start taking on: October 28, 2018    cefdinir 300 MG capsule Commonly known as: OMNICEF Take 1 capsule (300 mg total) by mouth 2 (two) times daily.   ferrous sulfate 325 (65 FE) MG tablet Take 1 tablet (325 mg total) by mouth daily with breakfast.   multivitamin-prenatal 27-0.8 MG Tabs tablet Take 1 tablet by mouth daily at 12 noon.       Follow-up Information    Department, [facility]. Schedule an appointment as soon as possible for a visit in 10 days.   Why: 10 days and afebrile Contact information: [address] [place] [id number] [phone]           Signed: Jennell Cornerhomas Schermerhorn MD Hit refresh and delete this line

## 2018-10-27 NOTE — Discharge Instructions (Signed)
COVID-19: How to Protect Yourself and Others °Know how it spreads °· There is currently no vaccine to prevent coronavirus disease 2019 (COVID-19). °· The best way to prevent illness is to avoid being exposed to this virus. °· The virus is thought to spread mainly from person-to-person. °? Between people who are in close contact with one another (within about 6 feet). °? Through respiratory droplets produced when an infected person coughs, sneezes or talks. °? These droplets can land in the mouths or noses of people who are nearby or possibly be inhaled into the lungs. °? Some recent studies have suggested that COVID-19 may be spread by people who are not showing symptoms. °Everyone should °Clean your hands often °· Wash your hands often with soap and water for at least 20 seconds especially after you have been in a public place, or after blowing your nose, coughing, or sneezing. °· If soap and water are not readily available, use a hand sanitizer that contains at least 60% alcohol. Cover all surfaces of your hands and rub them together until they feel dry. °· Avoid touching your eyes, nose, and mouth with unwashed hands. °Avoid close contact °· Stay home if you are sick. °· Avoid close contact with people who are sick. °· Put distance between yourself and other people. °? Remember that some people without symptoms may be able to spread virus. °? This is especially important for people who are at higher risk of getting very sick.www.cdc.gov/coronavirus/2019-ncov/need-extra-precautions/people-at-higher-risk.html °Cover your mouth and nose with a cloth face cover when around others °· You could spread COVID-19 to others even if you do not feel sick. °· Everyone should wear a cloth face cover when they have to go out in public, for example to the grocery store or to pick up other necessities. °? Cloth face coverings should not be placed on young children under age 2, anyone who has trouble breathing, or is unconscious,  incapacitated or otherwise unable to remove the mask without assistance. °· The cloth face cover is meant to protect other people in case you are infected. °· Do NOT use a facemask meant for a healthcare worker. °· Continue to keep about 6 feet between yourself and others. The cloth face cover is not a substitute for social distancing. °Cover coughs and sneezes °· If you are in a private setting and do not have on your cloth face covering, remember to always cover your mouth and nose with a tissue when you cough or sneeze or use the inside of your elbow. °· Throw used tissues in the trash. °· Immediately wash your hands with soap and water for at least 20 seconds. If soap and water are not readily available, clean your hands with a hand sanitizer that contains at least 60% alcohol. °Clean and disinfect °· Clean AND disinfect frequently touched surfaces daily. This includes tables, doorknobs, light switches, countertops, handles, desks, phones, keyboards, toilets, faucets, and sinks. www.cdc.gov/coronavirus/2019-ncov/prevent-getting-sick/disinfecting-your-home.html °· If surfaces are dirty, clean them: Use detergent or soap and water prior to disinfection. °· Then, use a household disinfectant. You can see a list of EPA-registered household disinfectants here. °cdc.gov/coronavirus °07/01/2018 °This information is not intended to replace advice given to you by your health care provider. Make sure you discuss any questions you have with your health care provider. °Document Released: 06/10/2018 Document Revised: 07/09/2018 Document Reviewed: 06/10/2018 °Elsevier Patient Education © 2020 [employer] ° ° °COVID-19 Frequently Asked Questions °COVID-19 (coronavirus disease) is an infection that is caused by a large   family of viruses. Some viruses cause illness in people and others cause illness in animals like camels, cats, and bats. In some cases, the viruses that cause illness in animals can spread to humans. °Where did  the coronavirus come from? °In December 2019, [place] told the World Health Organization (WHO) of several cases of lung disease (human respiratory illness). These cases were linked to an open seafood and livestock market in the city of [place]. The link to the seafood and livestock market suggests that the virus may have spread from animals to humans. However, since that first outbreak in December, the virus has also been shown to spread from person to person. °What is the name of the disease and the virus? °Disease name °Early on, this disease was called novel coronavirus. This is because scientists determined that the disease was caused by a new (novel) respiratory virus. The World Health Organization (WHO) has now named the disease COVID-19, or coronavirus disease. °Virus name °The virus that causes the disease is called severe acute respiratory syndrome coronavirus 2 (SARS-CoV-2). °More information on disease and virus naming °World Health Organization (WHO): www.who.int/emergencies/diseases/novel-coronavirus-2019/technical-guidance/naming-the-coronavirus-disease-(covid-2019)-and-the-virus-that-causes-it °Who is at risk for complications from coronavirus disease? °Some people may be at higher risk for complications from coronavirus disease. This includes older adults and people who have chronic diseases, such as heart disease, diabetes, and lung disease. °If you are at higher risk for complications, take these extra precautions: °· Avoid close contact with people who are sick or have a fever or cough. Stay at least 3-6 ft (1-2 m) away from them, if possible. °· Wash your hands often with soap and water for at least 20 seconds. °· Avoid touching your face, mouth, nose, or eyes. °· Keep supplies on hand at home, such as food, medicine, and cleaning supplies. °· Stay home as much as possible. °· Avoid social gatherings and travel. °How does coronavirus disease spread? °The virus that causes coronavirus disease spreads  easily from person to person (is contagious). There are also cases of community-spread disease. This means the disease has spread to: °· People who have no known contact with other infected people. °· People who have not traveled to areas where there are known cases. °It appears to spread from one person to another through droplets from coughing or sneezing. °Can I get the virus from touching surfaces or objects? °There is still a lot that we do not know about the virus that causes coronavirus disease. Scientists are basing a lot of information on what they know about similar viruses, such as: °· Viruses cannot generally survive on surfaces for long. They need a human body (host) to survive. °· It is more likely that the virus is spread by close contact with people who are sick (direct contact), such as through: °? Shaking hands or hugging. °? Breathing in respiratory droplets that travel through the air. This can happen when an infected person coughs or sneezes on or near other people. °· It is less likely that the virus is spread when a person touches a surface or object that has the virus on it (indirect contact). The virus may be able to enter the body if the person touches a surface or object and then touches his or her face, eyes, nose, or mouth. °Can a person spread the virus without having symptoms of the disease? °It may be possible for the virus to spread before a person has symptoms of the disease, but this is most likely not the main way the   virus is spreading. It is more likely for the virus to spread by being in close contact with people who are sick and breathing in the respiratory droplets of a sick person's cough or sneeze. °What are the symptoms of coronavirus disease? °Symptoms vary from person to person and can range from mild to severe. Symptoms may include: °· Fever. °· Cough. °· Tiredness, weakness, or fatigue. °· Fast breathing or feeling short of breath. °These symptoms can appear anywhere  from 2 to 14 days after you have been exposed to the virus. If you develop symptoms, call your health care provider. People with severe symptoms may need hospital care. °If I am exposed to the virus, how long does it take before symptoms start? °Symptoms of coronavirus disease may appear anywhere from 2 to 14 days after a person has been exposed to the virus. If you develop symptoms, call your health care provider. °Should I be tested for this virus? °Your health care provider will decide whether to test you based on your symptoms, history of exposure, and your risk factors. °How does a health care provider test for this virus? °Health care providers will collect samples to send for testing. Samples may include: °· Taking a swab of fluid from the nose. °· Taking fluid from the lungs by having you cough up mucus (sputum) into a sterile cup. °· Taking a blood sample. °· Taking a stool or urine sample. °Is there a treatment or vaccine for this virus? °Currently, there is no vaccine to prevent coronavirus disease. Also, there are no medicines like antibiotics or antivirals to treat the virus. A person who becomes sick is given supportive care, which means rest and fluids. A person may also relieve his or her symptoms by using over-the-counter medicines that treat sneezing, coughing, and runny nose. These are the same medicines that a person takes for the common cold. °If you develop symptoms, call your health care provider. People with severe symptoms may need hospital care. °What can I do to protect myself and my family from this virus? ° °  ° °You can protect yourself and your family by taking the same actions that you would take to prevent the spread of other viruses. Take the following actions: °· Wash your hands often with soap and water for at least 20 seconds. If soap and water are not available, use alcohol-based hand sanitizer. °· Avoid touching your face, mouth, nose, or eyes. °· Cough or sneeze into a tissue,  sleeve, or elbow. Do not cough or sneeze into your hand or the air. °? If you cough or sneeze into a tissue, throw it away immediately and wash your hands. °· Disinfect objects and surfaces that you frequently touch every day. °· Avoid close contact with people who are sick or have a fever or cough. Stay at least 3-6 ft (1-2 m) away from them, if possible. °· Stay home if you are sick, except to get medical care. Call your health care provider before you get medical care. °· Make sure your vaccines are up to date. Ask your health care provider what vaccines you need. °What should I do if I need to travel? °Follow travel recommendations from your local health authority, the CDC, and WHO. °Travel information and advice °· Centers for Disease Control and Prevention (CDC): www.cdc.gov/coronavirus/2019-ncov/travelers/index.html °· World Health Organization (WHO): www.who.int/emergencies/diseases/novel-coronavirus-2019/travel-advice °Know the risks and take action to protect your health °· You are at higher risk of getting coronavirus disease if you are traveling to   areas with an outbreak or if you are exposed to travelers from areas with an outbreak. °· Wash your hands often and practice good hygiene to lower the risk of catching or spreading the virus. °What should I do if I am sick? °General instructions to stop the spread of infection °· Wash your hands often with soap and water for at least 20 seconds. If soap and water are not available, use alcohol-based hand sanitizer. °· Cough or sneeze into a tissue, sleeve, or elbow. Do not cough or sneeze into your hand or the air. °· If you cough or sneeze into a tissue, throw it away immediately and wash your hands. °· Stay home unless you must get medical care. Call your health care provider or local health authority before you get medical care. °· Avoid public areas. Do not take public transportation, if possible. °· If you can, wear a mask if you must go out of the house  or if you are in close contact with someone who is not sick. °Keep your home clean °· Disinfect objects and surfaces that are frequently touched every day. This may include: °? Counters and tables. °? Doorknobs and light switches. °? Sinks and faucets. °? Electronics such as phones, remote controls, keyboards, computers, and tablets. °· Wash dishes in hot, soapy water or use a dishwasher. Air-dry your dishes. °· Wash laundry in hot water. °Prevent infecting other household members °· Let healthy household members care for children and pets, if possible. If you have to care for children or pets, wash your hands often and wear a mask. °· Sleep in a different bedroom or bed, if possible. °· Do not share personal items, such as razors, toothbrushes, deodorant, combs, brushes, towels, and washcloths. °Where to find more information °Centers for Disease Control and Prevention (CDC) °· Information and news updates: www.cdc.gov/coronavirus/2019-ncov °World Health Organization (WHO) °· Information and news updates: www.who.int/emergencies/diseases/novel-coronavirus-2019 °· Coronavirus health topic: www.who.int/health-topics/coronavirus °· Questions and answers on COVID-19: www.who.int/news-room/q-a-detail/q-a-coronaviruses °· Global tracker: who.sprinklr.com °American Academy of Pediatrics (AAP) °· Information for families: www.healthychildren.org/English/health-issues/conditions/chest-lungs/Pages/2019-Novel-Coronavirus.aspx °The coronavirus situation is changing rapidly. Check your local health authority website or the CDC and WHO websites for updates and news. °When should I contact a health care provider? °· Contact your health care provider if you have symptoms of an infection, such as fever or cough, and you: °? Have been near anyone who is known to have coronavirus disease. °? Have come into contact with a person who is suspected to have coronavirus disease. °? Have traveled outside of the country. °When should I get  emergency medical care? °· Get help right away by calling your local emergency services (911 in the [place] if you have: °? Trouble breathing. °? Pain or pressure in your chest. °? Confusion. °? Blue-tinged lips and fingernails. °? Difficulty waking from sleep. °? Symptoms that get worse. °Let the emergency medical personnel know if you think you have coronavirus disease. °Summary °· A new respiratory virus is spreading from person to person and causing COVID-19 (coronavirus disease). °· The virus that causes COVID-19 appears to spread easily. It spreads from one person to another through droplets from coughing or sneezing. °· Older adults and those with chronic diseases are at higher risk of disease. If you are at higher risk for complications, take extra precautions. °· There is currently no vaccine to prevent coronavirus disease. There are no medicines, such as antibiotics or antivirals, to treat the virus. °· You can protect yourself and your family by   washing your hands often, avoiding touching your face, and covering your coughs and sneezes. °This information is not intended to replace advice given to you by your health care provider. Make sure you discuss any questions you have with your health care provider. °Document Released: 06/10/2018 Document Revised: 06/10/2018 Document Reviewed: 06/10/2018 °Elsevier Patient Education © 2020 [employer] ° ° °COVID-19 °COVID-19 is a respiratory infection that is caused by a virus called severe acute respiratory syndrome coronavirus 2 (SARS-CoV-2). The disease is also known as coronavirus disease or novel coronavirus. In some people, the virus may not cause any symptoms. In others, it may cause a serious infection. The infection can get worse quickly and can lead to complications, such as: °· Pneumonia, or infection of the lungs. °· Acute respiratory distress syndrome or ARDS. This is fluid build-up in the lungs. °· Acute respiratory failure. This is a condition in which  there is not enough oxygen passing from the lungs to the body. °· Sepsis or septic shock. This is a serious bodily reaction to an infection. °· Blood clotting problems. °· Secondary infections due to bacteria or fungus. °The virus that causes COVID-19 is contagious. This means that it can spread from person to person through droplets from coughs and sneezes (respiratory secretions). °What are the causes? °This illness is caused by a virus. You may catch the virus by: °· Breathing in droplets from an infected person's cough or sneeze. °· Touching something, like a table or a doorknob, that was exposed to the virus (contaminated) and then touching your mouth, nose, or eyes. °What increases the risk? °Risk for infection °You are more likely to be infected with this virus if you: °· Live in or travel to an area with a COVID-19 outbreak. °· Come in contact with a sick person who recently traveled to an area with a COVID-19 outbreak. °· Provide care for or live with a person who is infected with COVID-19. °Risk for serious illness °You are more likely to become seriously ill from the virus if you: °· Are 65 years of age or older. °· Have a long-term disease that lowers your body's ability to fight infection (immunocompromised). °· Live in a nursing home or long-term care facility. °· Have a long-term (chronic) disease such as: °? Chronic lung disease, including chronic obstructive pulmonary disease or asthma °? Heart disease. °? Diabetes. °? Chronic kidney disease. °? Liver disease. °· Are obese. °What are the signs or symptoms? °Symptoms of this condition can range from mild to severe. Symptoms may appear any time from 2 to 14 days after being exposed to the virus. They include: °· A fever. °· A cough. °· Difficulty breathing. °· Chills. °· Muscle pains. °· A sore throat. °· Loss of taste or smell. °Some people may also have stomach problems, such as nausea, vomiting, or diarrhea. °Other people may not have any symptoms  of COVID-19. °How is this diagnosed? °This condition may be diagnosed based on: °· Your signs and symptoms, especially if: °? You live in an area with a COVID-19 outbreak. °? You recently traveled to or from an area where the virus is common. °? You provide care for or live with a person who was diagnosed with COVID-19. °· A physical exam. °· Lab tests, which may include: °? A nasal swab to take a sample of fluid from your nose. °? A throat swab to take a sample of fluid from your throat. °? A sample of mucus from your lungs (sputum). °?   Blood tests. °· Imaging tests, which may include, X-rays, CT scan, or ultrasound. °How is this treated? °At present, there is no medicine to treat COVID-19. Medicines that treat other diseases are being used on a trial basis to see if they are effective against COVID-19. °Your health care provider will talk with you about ways to treat your symptoms. For most people, the infection is mild and can be managed at home with rest, fluids, and over-the-counter medicines. °Treatment for a serious infection usually takes places in a hospital intensive care unit (ICU). It may include one or more of the following treatments. These treatments are given until your symptoms improve. °· Receiving fluids and medicines through an IV. °· Supplemental oxygen. Extra oxygen is given through a tube in the nose, a face mask, or a hood. °· Positioning you to lie on your stomach (prone position). This makes it easier for oxygen to get into the lungs. °· Continuous positive airway pressure (CPAP) or bi-level positive airway pressure (BPAP) machine. This treatment uses mild air pressure to keep the airways open. A tube that is connected to a motor delivers oxygen to the body. °· Ventilator. This treatment moves air into and out of the lungs by using a tube that is placed in your windpipe. °· Tracheostomy. This is a procedure to create a hole in the neck so that a breathing tube can be  inserted. °· Extracorporeal membrane oxygenation (ECMO). This procedure gives the lungs a chance to recover by taking over the functions of the heart and lungs. It supplies oxygen to the body and removes carbon dioxide. °Follow these instructions at home: °Lifestyle °· If you are sick, stay home except to get medical care. Your health care provider will tell you how long to stay home. Call your health care provider before you go for medical care. °· Rest at home as told by your health care provider. °· Do not use any products that contain nicotine or tobacco, such as cigarettes, e-cigarettes, and chewing tobacco. If you need help quitting, ask your health care provider. °· Return to your normal activities as told by your health care provider. Ask your health care provider what activities are safe for you. °General instructions °· Take over-the-counter and prescription medicines only as told by your health care provider. °· Drink enough fluid to keep your urine pale yellow. °· Keep all follow-up visits as told by your health care provider. This is important. °How is this prevented? ° °There is no vaccine to help prevent COVID-19 infection. However, there are steps you can take to protect yourself and others from this virus. °To protect yourself:  °· Do not travel to areas where COVID-19 is a risk. The areas where COVID-19 is reported change often. To identify high-risk areas and travel restrictions, check the CDC travel website: wwwnc.cdc.gov/travel/notices °· If you live in, or must travel to, an area where COVID-19 is a risk, take precautions to avoid infection. °? Stay away from people who are sick. °? Wash your hands often with soap and water for 20 seconds. If soap and water are not available, use an alcohol-based hand sanitizer. °? Avoid touching your mouth, face, eyes, or nose. °? Avoid going out in public, follow guidance from your state and local health authorities. °? If you must go out in public, wear a  cloth face covering or face mask. °? Disinfect objects and surfaces that are frequently touched every day. This may include: °§ Counters and tables. °§ Doorknobs and   light switches. °§ Sinks and faucets. °§ Electronics, such as phones, remote controls, keyboards, computers, and tablets. °To protect others: °If you have symptoms of COVID-19, take steps to prevent the virus from spreading to others. °· If you think you have a COVID-19 infection, contact your health care provider right away. Tell your health care team that you think you may have a COVID-19 infection. °· Stay home. Leave your house only to seek medical care. Do not use public transport. °· Do not travel while you are sick. °· Wash your hands often with soap and water for 20 seconds. If soap and water are not available, use alcohol-based hand sanitizer. °· Stay away from other members of your household. Let healthy household members care for children and pets, if possible. If you have to care for children or pets, wash your hands often and wear a mask. If possible, stay in your own room, separate from others. Use a different bathroom. °· Make sure that all people in your household wash their hands well and often. °· Cough or sneeze into a tissue or your sleeve or elbow. Do not cough or sneeze into your hand or into the air. °· Wear a cloth face covering or face mask. °Where to find more information °· Centers for Disease Control and Prevention: www.cdc.gov/coronavirus/2019-ncov/index.html °· World Health Organization: www.who.int/health-topics/coronavirus °Contact a health care provider if: °· You live in or have traveled to an area where COVID-19 is a risk and you have symptoms of the infection. °· You have had contact with someone who has COVID-19 and you have symptoms of the infection. °Get help right away if: °· You have trouble breathing. °· You have pain or pressure in your chest. °· You have confusion. °· You have bluish lips and  fingernails. °· You have difficulty waking from sleep. °· You have symptoms that get worse. °These symptoms may represent a serious problem that is an emergency. Do not wait to see if the symptoms will go away. Get medical help right away. Call your local emergency services (911 in the [place] Do not drive yourself to the hospital. Let the emergency medical personnel know if you think you have COVID-19. °Summary °· COVID-19 is a respiratory infection that is caused by a virus. It is also known as coronavirus disease or novel coronavirus. It can cause serious infections, such as pneumonia, acute respiratory distress syndrome, acute respiratory failure, or sepsis. °· The virus that causes COVID-19 is contagious. This means that it can spread from person to person through droplets from coughs and sneezes. °· You are more likely to develop a serious illness if you are 65 years of age or older, have a weak immunity, live in a nursing home, or have chronic disease. °· There is no medicine to treat COVID-19. Your health care provider will talk with you about ways to treat your symptoms. °· Take steps to protect yourself and others from infection. Wash your hands often and disinfect objects and surfaces that are frequently touched every day. Stay away from people who are sick and wear a mask if you are sick. °This information is not intended to replace advice given to you by your health care provider. Make sure you discuss any questions you have with your health care provider. °Document Released: 03/20/2018 Document Revised: 07/10/2018 Document Reviewed: 03/20/2018 °Elsevier Patient Education © 2020 Elsevier Inc. ° °

## 2018-10-28 LAB — HIV ANTIBODY (ROUTINE TESTING W REFLEX): HIV Screen 4th Generation wRfx: NONREACTIVE

## 2018-11-05 ENCOUNTER — Ambulatory Visit: Payer: Self-pay | Admitting: Advanced Practice Midwife

## 2018-11-05 ENCOUNTER — Ambulatory Visit: Payer: Self-pay

## 2018-11-05 ENCOUNTER — Other Ambulatory Visit: Payer: Self-pay

## 2018-11-05 DIAGNOSIS — Z3480 Encounter for supervision of other normal pregnancy, unspecified trimester: Secondary | ICD-10-CM

## 2018-11-05 DIAGNOSIS — Z8759 Personal history of other complications of pregnancy, childbirth and the puerperium: Secondary | ICD-10-CM

## 2018-11-05 DIAGNOSIS — O09299 Supervision of pregnancy with other poor reproductive or obstetric history, unspecified trimester: Secondary | ICD-10-CM

## 2018-11-05 DIAGNOSIS — U071 COVID-19: Secondary | ICD-10-CM

## 2018-11-05 NOTE — Progress Notes (Signed)
Call for [facility] Telehealth visit; confirmed identity & agrees to visit; recent +Covid & treated with Solumedrol and antibiotics at [facility] due to SOB; denies Covid symptoms currently & is off isolation after 11/07/18; c/o vaginal itching x 2 days-discussed OTC creams that family member can pick up for her and leave at door; also c/o increased vaginal pressure x ~1 wk and was worse 9/7 & 9/8 and feels worse at night; reviewed s/s PTL; scheduled f/u +labs appt. 11/12/18 Debera Lat, RN

## 2018-11-05 NOTE — Progress Notes (Signed)
   TELEPHONE OBSTETRICS VISIT ENCOUNTER NOTE  I connected with Ernst Bowler on 11/05/18 at  3:00 PM EDT by telephone at home and verified that I am speaking with the correct person using two identifiers.   I discussed the limitations, risks, security and privacy concerns of performing an evaluation and management service by telephone and the availability of in person appointments. I also discussed with the patient that there may be a patient responsible charge related to this service. The patient expressed understanding and agreed to proceed.  Subjective:  Jeanne [name] is a 28 y.o. [phone] at [redacted]w[redacted]d being followed for ongoing prenatal care.  She is currently monitored for the following issues for this low-risk pregnancy and has Supervision of other normal pregnancy, antepartum; History of postpartum hemorrhage; Hx of preeclampsia, prior pregnancy, currently pregnant; Adjustment disorder; Shortness of breath during pregnancy; and COVID-19 virus detected on their problem list.  Patient reports vaginal itching x2 days. Reports fetal movement. Denies any contractions, bleeding or leaking of fluid.   The following portions of the patient's history were reviewed and updated as appropriate: allergies, current medications, past family history, past medical history, past social history, past surgical history and problem list.   Objective:   General:  Alert, oriented and cooperative.   Mental Status: Normal mood and affect perceived. Normal judgment and thought content.  Rest of physical exam deferred due to type of encounter  Assessment and Plan:  Pregnancy: [id number] at [redacted]w[redacted]d 1. Supervision of other normal pregnancy, antepartum Feels well now after being tested on 10/17/18 and +COVID.  C/o vaginal itching x 2 days; counseled to get Clotrimazole cream or Monistat cream brought to her by friend or family member to put on her front porch and use x 7 days.  Also c/o vaginal pressure worse  at night past 1 week--counseled to increase rest after lunch and in afternoon, increase fluids, to L&D with signs PTL  2. Hx of preeclampsia, prior pregnancy, currently pregnant Stopped taking ASA 81 mg with +COVID on 10/20/18.  Was hospitalized at [facility] 9/5-11/03/18 with Solumedrol and antibiotics due to SOB.  CT scan no pneumonia.  Finished with isolation 11/07/18. Preterm labor symptoms and general obstetric precautions including but not limited to vaginal bleeding, contractions, leaking of fluid and fetal movement were reviewed in detail with the patient.  I discussed the assessment and treatment plan with the patient. The patient was provided an opportunity to ask questions and all were answered. The patient agreed with the plan and demonstrated an understanding of the instructions. The patient was advised to call back or seek an in-person office evaluation/go to the hospital for any urgent or concerning symptoms.  3.  Adjustment Disorder Has been seeing Milton Ferguson and had a Zoom appt with her 09/2018 and another scheduled for 11/12/18.  Pt states mood is stable Please refer to After Visit Summary for other counseling recommendations.   I provided 9 minutes of non-face-to-face time during this encounter.  Return in about 1 week (around 11/12/2018) for 28 week labs.  Future Appointments  Date Time Provider [place]  11/12/2018  3:00 PM AC-MH PROVIDER AC-[facility] None    Herbie Saxon, CNM

## 2018-11-12 ENCOUNTER — Ambulatory Visit (LOCAL_COMMUNITY_HEALTH_CENTER): Payer: Medicaid Other | Admitting: Advanced Practice Midwife

## 2018-11-12 ENCOUNTER — Other Ambulatory Visit: Payer: Self-pay

## 2018-11-12 VITALS — BP 95/53 | Temp 97.7°F | Wt 144.2 lb

## 2018-11-12 DIAGNOSIS — Z23 Encounter for immunization: Secondary | ICD-10-CM | POA: Diagnosis not present

## 2018-11-12 DIAGNOSIS — O99013 Anemia complicating pregnancy, third trimester: Secondary | ICD-10-CM

## 2018-11-12 DIAGNOSIS — Z3480 Encounter for supervision of other normal pregnancy, unspecified trimester: Secondary | ICD-10-CM

## 2018-11-12 DIAGNOSIS — U071 COVID-19: Secondary | ICD-10-CM

## 2018-11-12 DIAGNOSIS — O09299 Supervision of pregnancy with other poor reproductive or obstetric history, unspecified trimester: Secondary | ICD-10-CM

## 2018-11-12 DIAGNOSIS — D649 Anemia, unspecified: Secondary | ICD-10-CM | POA: Insufficient documentation

## 2018-11-12 LAB — HIV ANTIBODY (ROUTINE TESTING W REFLEX): HIV 1&2 Ab, 4th Generation: NONREACTIVE

## 2018-11-12 LAB — HEMOGLOBIN, FINGERSTICK: Hemoglobin: 10.4 g/dL — ABNORMAL LOW (ref 11.1–15.9)

## 2018-11-12 MED ORDER — FERROUS SULFATE 324 (65 FE) MG PO TBEC: 1.0000 | DELAYED_RELEASE_TABLET | Freq: Every day | ORAL | 0 refills | Status: DC

## 2018-11-12 NOTE — Progress Notes (Signed)
Anemia profile added to orders, patient given FeSO4 and instructed to take once each day with vitamin C juice. Anemia pamphlet given.Jenetta Downer, RN

## 2018-11-12 NOTE — Progress Notes (Signed)
Patient here for maternity visit at 28 5/7. 28 week labs today, needs to be to lab by about 4:00pm. Tdap given, right deltoid, tolerated well, VIS given.Jenetta Downer, RN

## 2018-11-12 NOTE — Addendum Note (Signed)
Addended by: Jenetta Downer on: 11/12/2018 05:07 PM   Modules accepted: Orders

## 2018-11-13 LAB — RPR: RPR Ser Ql: NONREACTIVE

## 2018-11-13 LAB — FE+CBC/D/PLT+TIBC+FER+RETIC
Basophils Absolute: 0 10*3/uL (ref 0.0–0.2)
Basos: 0 %
EOS (ABSOLUTE): 0.1 10*3/uL (ref 0.0–0.4)
Eos: 2 %
Ferritin: 13 ng/mL — ABNORMAL LOW (ref 15–150)
Hematocrit: 31 % — ABNORMAL LOW (ref 34.0–46.6)
Hemoglobin: 10.3 g/dL — ABNORMAL LOW (ref 11.1–15.9)
Immature Grans (Abs): 0.3 10*3/uL — ABNORMAL HIGH (ref 0.0–0.1)
Immature Granulocytes: 4 %
Iron Saturation: 7 % — CL (ref 15–55)
Iron: 46 ug/dL (ref 27–159)
Lymphocytes Absolute: 1.2 10*3/uL (ref 0.7–3.1)
Lymphs: 16 %
MCH: 29.5 pg (ref 26.6–33.0)
MCHC: 33.2 g/dL (ref 31.5–35.7)
MCV: 89 fL (ref 79–97)
Monocytes Absolute: 0.6 10*3/uL (ref 0.1–0.9)
Monocytes: 8 %
Neutrophils Absolute: 5.4 10*3/uL (ref 1.4–7.0)
Neutrophils: 70 %
Platelets: 183 10*3/uL (ref 150–450)
RBC: 3.49 x10E6/uL — ABNORMAL LOW (ref 3.77–5.28)
RDW: 14.1 % (ref 11.7–15.4)
Retic Ct Pct: 3.1 % — ABNORMAL HIGH (ref 0.6–2.6)
Total Iron Binding Capacity: 651 ug/dL (ref 250–450)
UIBC: 605 ug/dL — ABNORMAL HIGH (ref 131–425)
WBC: 7.6 10*3/uL (ref 3.4–10.8)

## 2018-11-13 LAB — GLUCOSE, 1 HOUR GESTATIONAL: Gestational Diabetes Screen: 91 mg/dL (ref 65–139)

## 2018-11-26 ENCOUNTER — Other Ambulatory Visit: Payer: Self-pay

## 2018-11-26 ENCOUNTER — Ambulatory Visit: Payer: Medicaid Other | Admitting: Advanced Practice Midwife

## 2018-11-26 DIAGNOSIS — F4320 Adjustment disorder, unspecified: Secondary | ICD-10-CM

## 2018-11-26 DIAGNOSIS — Z3480 Encounter for supervision of other normal pregnancy, unspecified trimester: Secondary | ICD-10-CM

## 2018-11-26 DIAGNOSIS — O09299 Supervision of pregnancy with other poor reproductive or obstetric history, unspecified trimester: Secondary | ICD-10-CM

## 2018-11-26 DIAGNOSIS — U071 COVID-19: Secondary | ICD-10-CM | POA: Diagnosis not present

## 2018-11-26 DIAGNOSIS — O99013 Anemia complicating pregnancy, third trimester: Secondary | ICD-10-CM

## 2018-11-26 NOTE — Progress Notes (Addendum)
Telehealth phone call to patient. RN spoke with patient identifying correct patient and current address. Patient states she is comfortable discussing [place] over phone at current location. Patient states she does not have home scales or BP cuff to assess weight and BP. Patient answers no to all Covid-19 questions. Taking PNV and Iron QD but not together. Denies ED/hospital visits since last RV. PTL s/s information reviewed with patient and will need written information at next appt. Next RV appt scheduled for 12/10/2018 @ 4:00. Instructed patient to arrive at 3:45 for check in. Also instructed patient to remain at this same contact number for at least 30-45 minutes for provider to call back and complete provider portion of appt. Patient verbalized understanding of all of the above. Hal Morales, RN

## 2018-11-26 NOTE — Progress Notes (Signed)
   TELEPHONE OBSTETRICS VISIT ENCOUNTER NOTE  I connected with Jeanne [name] @ on 11/26/18 at  2:00 PM EDT by telephone at home and verified that I am speaking with the correct person using two identifiers.   I discussed the limitations, risks, security and privacy concerns of performing an evaluation and management service by telephone and the availability of in person appointments. I also discussed with the patient that there may be a patient responsible charge related to this service. The patient expressed understanding and agreed to proceed.  Subjective:  Jeanne [name] is a 28 y.o. [phone] at [redacted]w[redacted]d being followed for ongoing prenatal care.  She is currently monitored for the following issues for this low-risk pregnancy and has Supervision of other normal pregnancy, antepartum; History of postpartum hemorrhage; Hx of preeclampsia, prior pregnancy, currently pregnant; Adjustment disorder; Shortness of breath during pregnancy; COVID-19 virus detected; and Anemia affecting pregnancy in third trimester on their problem list.  Patient reports no complaints. Reports fetal movement. Denies any contractions, bleeding or leaking of fluid.   The following portions of the patient's history were reviewed and updated as appropriate: allergies, current medications, past family history, past medical history, past social history, past surgical history and problem list.   Objective:   General:  Alert, oriented and cooperative.   Mental Status: Normal mood and affect perceived. Normal judgment and thought content.  Rest of physical exam deferred due to type of encounter  Assessment and Plan:  Pregnancy: [id number] at [redacted]w[redacted]d 1. Supervision of other normal pregnancy, antepartum Feels well. Denies SOB.   2. Hx of preeclampsia, prior pregnancy, currently pregnant Taking ASA 81 mg daily  3. COVID-19 virus detected Off isolation 11/07/18  4. Adjustment disorder, unspecified type States mood  is better and is seeing Val Riles time 11/17/18  5. Anemia affecting pregnancy in third trimester Taking FeSo4 I daily with oj  Preterm labor symptoms and general obstetric precautions including but not limited to vaginal bleeding, contractions, leaking of fluid and fetal movement were reviewed in detail with the patient.  I discussed the assessment and treatment plan with the patient. The patient was provided an opportunity to ask questions and all were answered. The patient agreed with the plan and demonstrated an understanding of the instructions. The patient was advised to call back or seek an in-person office evaluation/go to the hospital for any urgent or concerning symptoms.  Please refer to After Visit Summary for other counseling recommendations.   I provided 8 minutes of non-face-to-face time during this encounter.  No follow-ups on file.  Future Appointments  Date Time Provider [place]  12/10/2018  4:00 PM AC-MH PROVIDER AC-[facility] None    Herbie Saxon, CNM

## 2018-12-02 NOTE — Addendum Note (Signed)
Addended by: Tray Klayman on: 12/02/2018 01:51 PM   Modules accepted: Orders  

## 2018-12-10 ENCOUNTER — Other Ambulatory Visit: Payer: Self-pay

## 2018-12-10 ENCOUNTER — Ambulatory Visit: Payer: Medicaid Other | Admitting: Advanced Practice Midwife

## 2018-12-10 VITALS — BP 103/56 | Temp 98.8°F | Wt 149.6 lb

## 2018-12-10 DIAGNOSIS — O99013 Anemia complicating pregnancy, third trimester: Secondary | ICD-10-CM

## 2018-12-10 DIAGNOSIS — Z3480 Encounter for supervision of other normal pregnancy, unspecified trimester: Secondary | ICD-10-CM

## 2018-12-10 LAB — HEMOGLOBIN, FINGERSTICK: Hemoglobin: 11.1 g/dL (ref 11.1–15.9)

## 2018-12-10 NOTE — Progress Notes (Signed)
   PRENATAL VISIT NOTE  Subjective:  Jeanne [name] Harshitha Fretz is a 28 y.o. [phone] at [redacted]w[redacted]d being seen today for ongoing prenatal care.  She is currently monitored for the following issues for this low-risk pregnancy and has Supervision of other normal pregnancy, antepartum; History of postpartum hemorrhage; Hx of preeclampsia, prior pregnancy, currently pregnant; Adjustment disorder; Shortness of breath during pregnancy; COVID-19 virus detected; and Anemia affecting pregnancy in third trimester on their problem list.  Patient reports no complaints.  Contractions: Not present. Vag. Bleeding: None.  Movement: Present. Denies leaking of fluid/ROM.   The following portions of the patient's history were reviewed and updated as appropriate: allergies, current medications, past family history, past medical history, past social history, past surgical history and problem list. Problem list updated.  Objective:   Vitals:   12/10/18 1544  BP: (!) 103/56  Temp: 98.8 F (37.1 C)  Weight: 149 lb 9.6 oz (67.9 kg)    Fetal Status:   Fundal Height: 34 cm Movement: Present     General:  Alert, oriented and cooperative. Patient is in no acute distress.  Skin: Skin is warm and dry. No rash noted.   Cardiovascular: Normal heart rate noted  Respiratory: Normal respiratory effort, no problems with respiration noted  Abdomen: Soft, gravid, appropriate for gestational age.  Pain/Pressure: Absent     Pelvic: Cervical exam deferred        Extremities: Normal range of motion.  Edema: None  Mental Status: Normal mood and affect. Normal behavior. Normal judgment and thought content.   Assessment and Plan:  Pregnancy: [id number] at [redacted]w[redacted]d  1. Supervision of other normal pregnancy, antepartum Feels well.  Taking ASA 81 mg daily.  Seeing Milton Ferguson, LCSW--last time was 12/03/18.  Reviewed anatomy u/s from 09/11/18 at 19 4/7 wks with AFI=wnl, anatomy wnl, EDC by LMP - Hemoglobin, fingerstick  2. Anemia affecting  pregnancy in third trimester Taking FeSo4 I daily - Hemoglobin, fingerstick   Preterm labor symptoms and general obstetric precautions including but not limited to vaginal bleeding, contractions, leaking of fluid and fetal movement were reviewed in detail with the patient. Please refer to After Visit Summary for other counseling recommendations.  Return in about 2 weeks (around 12/24/2018) for routine PNC.  No future appointments.  Herbie Saxon, CNM

## 2018-12-10 NOTE — Progress Notes (Signed)
Patient here for MH RV at 24 5/7. PTL sheet given, Kick counts reviewed and cards given. Needs Hgb check today.

## 2018-12-24 ENCOUNTER — Other Ambulatory Visit: Payer: Self-pay

## 2018-12-24 ENCOUNTER — Ambulatory Visit: Payer: Medicaid Other | Admitting: Advanced Practice Midwife

## 2018-12-24 VITALS — BP 100/56 | Temp 97.5°F | Wt 153.2 lb

## 2018-12-24 DIAGNOSIS — Z3480 Encounter for supervision of other normal pregnancy, unspecified trimester: Secondary | ICD-10-CM

## 2018-12-24 DIAGNOSIS — O99013 Anemia complicating pregnancy, third trimester: Secondary | ICD-10-CM

## 2018-12-24 DIAGNOSIS — Z23 Encounter for immunization: Secondary | ICD-10-CM

## 2018-12-24 DIAGNOSIS — O09299 Supervision of pregnancy with other poor reproductive or obstetric history, unspecified trimester: Secondary | ICD-10-CM

## 2018-12-24 DIAGNOSIS — F4320 Adjustment disorder, unspecified: Secondary | ICD-10-CM

## 2018-12-24 NOTE — Progress Notes (Signed)
Here today for a 34.5 week MH RV. Taking PNV, Iron and ASA QD. Denies ED/hospital visits since last RV. Flu vaccine given and tolerated well. Hal Morales, RN

## 2018-12-24 NOTE — Progress Notes (Signed)
   PRENATAL VISIT NOTE  Subjective:  Jeanne [name] is a 28 y.o. [phone] at [redacted]w[redacted]d being seen today for ongoing prenatal care.  She is currently monitored for the following issues for this low-risk pregnancy and has Supervision of other normal pregnancy, antepartum; History of postpartum hemorrhage; Hx of preeclampsia, prior pregnancy, currently pregnant; Adjustment disorder; Shortness of breath during pregnancy; COVID-19 virus detected; and Anemia affecting pregnancy in third trimester on their problem list.  Patient reports no complaints.  Contractions: Not present. Vag. Bleeding: None.  Movement: Present. Denies leaking of fluid/ROM.   The following portions of the patient's history were reviewed and updated as appropriate: allergies, current medications, past family history, past medical history, past social history, past surgical history and problem list. Problem list updated.  Objective:   Vitals:   12/24/18 1534  BP: (!) 100/56  Temp: (!) 97.5 F (36.4 C)  Weight: 153 lb 3.2 oz (69.5 kg)    Fetal Status: Fetal Heart Rate (bpm): 120 Fundal Height: 35 cm Movement: Present  Presentation: Vertex  General:  Alert, oriented and cooperative. Patient is in no acute distress.  Skin: Skin is warm and dry. No rash noted.   Cardiovascular: Normal heart rate noted  Respiratory: Normal respiratory effort, no problems with respiration noted  Abdomen: Soft, gravid, appropriate for gestational age.  Pain/Pressure: Absent     Pelvic: Cervical exam deferred        Extremities: Normal range of motion.  Edema: None  Mental Status: Normal mood and affect. Normal behavior. Normal judgment and thought content.   Assessment and Plan:  Pregnancy: [id number] at 109w5d  1. Supervision of other normal pregnancy, antepartum Feels well. Not working - Flu Vaccine QUAD 36+ mos IM  2. Hx of preeclampsia, prior pregnancy, currently pregnant Taking ASA 81 mg daily BP=wnl  3. Adjustment disorder,  unspecified type Seeing Milton Ferguson; last apt 12/05/18  4. Anemia affecting pregnancy in third trimester Taking FeSo4 I daily with water   Preterm labor symptoms and general obstetric precautions including but not limited to vaginal bleeding, contractions, leaking of fluid and fetal movement were reviewed in detail with the patient. Please refer to After Visit Summary for other counseling recommendations.  No follow-ups on file.  No future appointments.  Herbie Saxon, CNM

## 2019-01-08 ENCOUNTER — Encounter: Payer: Self-pay | Admitting: Physician Assistant

## 2019-01-08 ENCOUNTER — Other Ambulatory Visit: Payer: Self-pay

## 2019-01-08 ENCOUNTER — Ambulatory Visit: Payer: Medicaid Other | Admitting: Physician Assistant

## 2019-01-08 DIAGNOSIS — O09299 Supervision of pregnancy with other poor reproductive or obstetric history, unspecified trimester: Secondary | ICD-10-CM

## 2019-01-08 DIAGNOSIS — Z3480 Encounter for supervision of other normal pregnancy, unspecified trimester: Secondary | ICD-10-CM

## 2019-01-08 LAB — OB RESULTS CONSOLE GBS: GBS: NEGATIVE

## 2019-01-08 NOTE — Progress Notes (Signed)
Patient here for MH RV at 36 6/7 wks. 36 week labs and packet given today. Patient prefers provider to collect labs. Patient has stopped taking her ASA as instructed.Jenetta Downer, RN

## 2019-01-08 NOTE — Progress Notes (Signed)
   PRENATAL VISIT NOTE  Subjective:  Jeanne [name] is a 28 y.o. [phone] at [redacted]w[redacted]d being seen today for ongoing prenatal care.  She is currently monitored for the following issues for this high-risk pregnancy and has Supervision of other normal pregnancy, antepartum; History of postpartum hemorrhage; Hx of preeclampsia, prior pregnancy, currently pregnant; Adjustment disorder; Shortness of breath during pregnancy; COVID-19 virus detected; and Anemia affecting pregnancy in third trimester on their problem list.  Patient reports no complaints.  Contractions: Not present. Vag. Bleeding: None.  Movement: Present. Denies leaking of fluid/ROM.   The following portions of the patient's history were reviewed and updated as appropriate: allergies, current medications, past family history, past medical history, past social history, past surgical history and problem list. Problem list updated.  Objective:   Vitals:   01/08/19 1559  BP: (!) 97/56  Temp: 98.2 F (36.8 C)  Weight: 157 lb (71.2 kg)    Fetal Status: Fetal Heart Rate (bpm): 132 Fundal Height: 38 cm Movement: Present  Presentation: Vertex  General:  Alert, oriented and cooperative. Patient is in no acute distress.  Skin: Skin is warm and dry. No rash noted.   Cardiovascular: Normal heart rate noted  Respiratory: Normal respiratory effort, no problems with respiration noted  Abdomen: Soft, gravid, appropriate for gestational age.  Pain/Pressure: Absent     Pelvic: Cervical exam deferred        Extremities: Normal range of motion.  Edema: Trace  Mental Status: Normal mood and affect. Normal behavior. Normal judgment and thought content.   Assessment and Plan:  Pregnancy: [id number] at [redacted]w[redacted]d  1. Supervision of other normal pregnancy, antepartum Doing well. Anticipatory guidance re: weekly visits until delivery. - Chlamydia/GC NAA, Confirmation - GBS Culture  2. Hx of preeclampsia, prior pregnancy, currently pregnant BP  normal, scant pedal edema, no HA, RUQ pain or visual sx. Continue to monitor.   Preterm labor symptoms and general obstetric precautions including but not limited to vaginal bleeding, contractions, leaking of fluid and fetal movement were reviewed in detail with the patient. Please refer to After Visit Summary for other counseling recommendations.  Return in about 1 week (around 01/15/2019) for Routine prenatal care.  Future Appointments  Date Time Provider [place]  01/15/2019  4:00 PM AC-MH PROVIDER AC-MAT None    Lora Havens, PA-C

## 2019-01-10 LAB — CHLAMYDIA/GC NAA, CONFIRMATION
Chlamydia trachomatis, NAA: NEGATIVE
Neisseria gonorrhoeae, NAA: NEGATIVE

## 2019-01-12 LAB — CULTURE, BETA STREP (GROUP B ONLY): Strep Gp B Culture: NEGATIVE

## 2019-01-15 ENCOUNTER — Other Ambulatory Visit: Payer: Self-pay

## 2019-01-15 ENCOUNTER — Ambulatory Visit: Payer: Medicaid Other | Admitting: Family Medicine

## 2019-01-15 ENCOUNTER — Encounter: Payer: Self-pay | Admitting: Family Medicine

## 2019-01-15 VITALS — BP 106/49 | Temp 98.5°F | Wt 159.6 lb

## 2019-01-15 DIAGNOSIS — O99013 Anemia complicating pregnancy, third trimester: Secondary | ICD-10-CM

## 2019-01-15 DIAGNOSIS — O09299 Supervision of pregnancy with other poor reproductive or obstetric history, unspecified trimester: Secondary | ICD-10-CM

## 2019-01-15 DIAGNOSIS — Z8759 Personal history of other complications of pregnancy, childbirth and the puerperium: Secondary | ICD-10-CM

## 2019-01-15 DIAGNOSIS — Z3480 Encounter for supervision of other normal pregnancy, unspecified trimester: Secondary | ICD-10-CM

## 2019-01-15 NOTE — Progress Notes (Signed)
   PRENATAL VISIT NOTE  Subjective:  Jeanne [name] is a 28 y.o. [phone] at [redacted]w[redacted]d being seen today for ongoing prenatal care.  She is currently monitored for the following issues for this low-risk pregnancy and has Supervision of other normal pregnancy, antepartum; History of postpartum hemorrhage; Hx of preeclampsia, prior pregnancy, currently pregnant; Adjustment disorder; Shortness of breath during pregnancy; COVID-19 virus detected; and Anemia affecting pregnancy in third trimester on their problem list.  Patient reports no complaints.  Contractions: Not present. Vag. Bleeding: None.  Movement: Present. Denies leaking of fluid/ROM.   Taking iron supplements, tolerating well. Has not yet decided about circumcision. FOB to be present in delivery.   The following portions of the patient's history were reviewed and updated as appropriate: allergies, current medications, past family history, past medical history, past social history, past surgical history and problem list. Problem list updated.  Objective:   Vitals:   01/15/19 1549  BP: (!) 106/49  Temp: 98.5 F (36.9 C)  Weight: 159 lb 9.6 oz (72.4 kg)    Fetal Status: Fetal Heart Rate (bpm): 145 Fundal Height: 38 cm Movement: Present  Presentation: Vertex  General:  Alert, oriented and cooperative. Patient is in no acute distress.  Skin: Skin is warm and dry. No rash noted.   Cardiovascular: Normal heart rate noted  Respiratory: Normal respiratory effort, no problems with respiration noted  Abdomen: Soft, gravid, appropriate for gestational age.  Pain/Pressure: Absent     Pelvic: Cervical exam deferred        Extremities: Normal range of motion.  Edema: None  Mental Status: Normal mood and affect. Normal behavior. Normal judgment and thought content.   Assessment and Plan:  Pregnancy: [id number] at [redacted]w[redacted]d  1. Supervision of other normal pregnancy, antepartum Up to date. Pt is awaiting labor and knows when to go to  L&D.   2. Anemia affecting pregnancy in third trimester Continues with iron. Hgb 10/14 11.1.  3. History of postpartum hemorrhage  4. Hx of preeclampsia, prior pregnancy, currently pregnant No edema, HA, vision disturbance, epigastric pain.     Term labor symptoms and general obstetric precautions including but not limited to vaginal bleeding, contractions, leaking of fluid and fetal movement were reviewed in detail with the patient. Please refer to After Visit Summary for other counseling recommendations.  Return in about 1 week (around 01/22/2019) for routine prenatal care.  Future Appointments  Date Time Provider [place]  01/27/2019  4:00 PM AC-MH PROVIDER AC-[facility] None    Kandee Keen, PA-C

## 2019-01-15 NOTE — Progress Notes (Signed)
Here today for 37.6 week MH RV. Taking PNV and Iron QD, denies ED/hospital visits since last RV. Hal Morales, RN

## 2019-01-27 ENCOUNTER — Other Ambulatory Visit: Payer: Self-pay

## 2019-01-27 ENCOUNTER — Ambulatory Visit: Payer: Medicaid Other | Admitting: Advanced Practice Midwife

## 2019-01-27 VITALS — BP 104/68 | Temp 98.2°F | Wt 162.6 lb

## 2019-01-27 DIAGNOSIS — Z3480 Encounter for supervision of other normal pregnancy, unspecified trimester: Secondary | ICD-10-CM

## 2019-01-27 DIAGNOSIS — O09299 Supervision of pregnancy with other poor reproductive or obstetric history, unspecified trimester: Secondary | ICD-10-CM

## 2019-01-27 DIAGNOSIS — O99213 Obesity complicating pregnancy, third trimester: Secondary | ICD-10-CM

## 2019-01-27 DIAGNOSIS — O99210 Obesity complicating pregnancy, unspecified trimester: Secondary | ICD-10-CM | POA: Insufficient documentation

## 2019-01-27 DIAGNOSIS — F4329 Adjustment disorder with other symptoms: Secondary | ICD-10-CM

## 2019-01-27 DIAGNOSIS — O99013 Anemia complicating pregnancy, third trimester: Secondary | ICD-10-CM

## 2019-01-27 NOTE — Progress Notes (Signed)
   PRENATAL VISIT NOTE  Subjective:  Jeanne [name] is a 28 y.o. [phone] at [redacted]w[redacted]d being seen today for ongoing prenatal care.  She is currently monitored for the following issues for this low-risk pregnancy and has Supervision of other normal pregnancy, antepartum; History of postpartum hemorrhage; Hx of preeclampsia, prior pregnancy, currently pregnant; Adjustment disorder; Shortness of breath during pregnancy; COVID-19 virus detected; Anemia affecting pregnancy in third trimester; and Obesity affecting pregnancy BMI=31.7 on their problem list.  Patient reports no complaints.  Contractions: Not present. Vag. Bleeding: None.  Movement: Present. Denies leaking of fluid/ROM.   The following portions of the patient's history were reviewed and updated as appropriate: allergies, current medications, past family history, past medical history, past social history, past surgical history and problem list. Problem list updated.  Objective:   Vitals:   01/27/19 1604  BP: 104/68  Temp: 98.2 F (36.8 C)  Weight: 162 lb 9.6 oz (73.8 kg)    Fetal Status: Fetal Heart Rate (bpm): 140 Fundal Height: 39 cm Movement: Present  Presentation: Vertex  General:  Alert, oriented and cooperative. Patient is in no acute distress.  Skin: Skin is warm and dry. No rash noted.   Cardiovascular: Normal heart rate noted  Respiratory: Normal respiratory effort, no problems with respiration noted  Abdomen: Soft, gravid, appropriate for gestational age.  Pain/Pressure: Absent     Pelvic: Cervical exam performed Dilation: 3 Effacement (%): 50 Station: -3  Extremities: Normal range of motion.  Edema: None  Mental Status: Normal mood and affect. Normal behavior. Normal judgment and thought content.   Assessment and Plan:  Pregnancy: [id number] at [redacted]w[redacted]d  1. Supervision of other normal pregnancy, antepartum Knows when to go to L&D.  Has car seat and ready for baby at home.  Had u/c's last night x 4 hrs.  Desires IOL  on 02/06/19--paperwork completed  2. Hx of preeclampsia, prior pregnancy, currently pregnant Stopped taking ASA 81 mg daily  3. Anemia affecting pregnancy in third trimester Taking FeSo4 I daily  4. Adjustment disorder with other symptom Has been receiving counseling from Milton Ferguson; [place] had to cancel their 01/21/19 apt and will call her with another apt  5. Obesity affecting pregnancy in third trimester 30 lb 9.6 oz (13.9 kg)    Term labor symptoms and general obstetric precautions including but not limited to vaginal bleeding, contractions, leaking of fluid and fetal movement were reviewed in detail with the patient. Please refer to After Visit Summary for other counseling recommendations.  Return in about 1 week (around 02/03/2019) for routine PNC.  No future appointments.  Herbie Saxon, CNM

## 2019-01-27 NOTE — Progress Notes (Signed)
Patient here for MH RV at 39 4/7. Needs cervix check today and IOL referral..Suraj Ramdass Wynelle Beckmann, RN

## 2019-01-27 NOTE — Progress Notes (Signed)
[facility] IOL referral faxed with confirmation..Charley Miske Brewer-Jensen, RN  ?

## 2019-01-29 ENCOUNTER — Other Ambulatory Visit: Payer: Self-pay | Admitting: Certified Nurse Midwife

## 2019-01-29 DIAGNOSIS — U071 COVID-19: Secondary | ICD-10-CM

## 2019-01-29 NOTE — Progress Notes (Unsigned)
28 y.o. [id number]  Patient's last menstrual period was 04/25/2018. Estimated Date of Delivery: 01/30/19  Female  Factors complicating this pregnancy  COVID pos 10/17/18, 10/25/18 10/25/18 BMP, Troponin I, Fibrin C-Reactive Protein collected  Screening results and needs: NOB:  MBT: O pos   Ab screen: neg   Pap   HIV: NR  Hep B: neg RPR: NR Rubella: imm    VZV: Imm G/C: neg Edinburgh Depression Score: Medicaid Questionnaire:   First trimester: (Did they do cffDNA or NT/blood draw?) Maternity21: neg  10/25/18 BMP and Troponin I collected  28 weeks:  Hgb: 10.3 also collected Fe+CBC/D/Plt+TIBC+FER+RETIC Plt count: 183  Glucola: 91   Rhogam: n/a Edinburgh Depression Score: Medicaid Questionnaire: Blood consent:  36 weeks:  GBS: neg   G/C: neg   Hgb: 11.1 (10/14)   RPR: done 12/3    [place] results:    Immunization:   Flu in season -  Tdap at 27-36 weeks -   Delivery information: Social: no changes  Contraception Plan:  Feeding Plan:  Birth plan notes:   Clydene Laming 01/29/19 10:36 AM

## 2019-01-30 ENCOUNTER — Encounter (INDEPENDENT_AMBULATORY_CARE_PROVIDER_SITE_OTHER): Payer: Self-pay

## 2019-01-31 ENCOUNTER — Inpatient Hospital Stay
Admission: EM | Admit: 2019-01-31 | Discharge: 2019-02-01 | DRG: 805 | Disposition: A | Discharge status: Home / Self Care | Payer: Medicaid Other | Attending: Certified Nurse Midwife | Admitting: Certified Nurse Midwife

## 2019-01-31 ENCOUNTER — Other Ambulatory Visit: Payer: Self-pay

## 2019-01-31 ENCOUNTER — Encounter: Payer: Self-pay | Admitting: Anesthesiology

## 2019-01-31 DIAGNOSIS — U071 COVID-19: Secondary | ICD-10-CM | POA: Diagnosis present

## 2019-01-31 DIAGNOSIS — O9852 Other viral diseases complicating childbirth: Secondary | ICD-10-CM | POA: Diagnosis present

## 2019-01-31 DIAGNOSIS — Z3A40 40 weeks gestation of pregnancy: Secondary | ICD-10-CM

## 2019-01-31 DIAGNOSIS — O09299 Supervision of pregnancy with other poor reproductive or obstetric history, unspecified trimester: Secondary | ICD-10-CM

## 2019-01-31 DIAGNOSIS — O99214 Obesity complicating childbirth: Secondary | ICD-10-CM | POA: Diagnosis present

## 2019-01-31 DIAGNOSIS — O480 Post-term pregnancy: Secondary | ICD-10-CM | POA: Diagnosis present

## 2019-01-31 DIAGNOSIS — D62 Acute posthemorrhagic anemia: Secondary | ICD-10-CM | POA: Diagnosis not present

## 2019-01-31 DIAGNOSIS — Z8759 Personal history of other complications of pregnancy, childbirth and the puerperium: Secondary | ICD-10-CM

## 2019-01-31 DIAGNOSIS — E669 Obesity, unspecified: Secondary | ICD-10-CM | POA: Diagnosis present

## 2019-01-31 DIAGNOSIS — Z3480 Encounter for supervision of other normal pregnancy, unspecified trimester: Secondary | ICD-10-CM

## 2019-01-31 DIAGNOSIS — Z3483 Encounter for supervision of other normal pregnancy, third trimester: Secondary | ICD-10-CM | POA: Diagnosis not present

## 2019-01-31 DIAGNOSIS — O9081 Anemia of the puerperium: Secondary | ICD-10-CM | POA: Diagnosis not present

## 2019-01-31 LAB — SARS CORONAVIRUS 2 BY RT PCR (HOSPITAL ORDER, PERFORMED IN ~~LOC~~ HOSPITAL LAB): SARS Coronavirus 2: POSITIVE — AB

## 2019-01-31 LAB — CBC
HCT: 39.3 % (ref 36.0–46.0)
Hemoglobin: 12.7 g/dL (ref 12.0–15.0)
MCH: 27.9 pg (ref 26.0–34.0)
MCHC: 32.3 g/dL (ref 30.0–36.0)
MCV: 86.2 fL (ref 80.0–100.0)
Platelets: 145 10*3/uL — ABNORMAL LOW (ref 150–400)
RBC: 4.56 MIL/uL (ref 3.87–5.11)
RDW: 16.1 % — ABNORMAL HIGH (ref 11.5–15.5)
WBC: 8.2 10*3/uL (ref 4.0–10.5)
nRBC: 0.4 % — ABNORMAL HIGH (ref 0.0–0.2)

## 2019-01-31 LAB — TYPE AND SCREEN
ABO/RH(D): O POS
Antibody Screen: NEGATIVE

## 2019-01-31 MED ORDER — SENNOSIDES-DOCUSATE SODIUM 8.6-50 MG PO TABS
2.0000 | ORAL_TABLET | ORAL | Status: DC
  Administered 2019-02-01: 2 via ORAL
  Filled 2019-01-31: qty 2

## 2019-01-31 MED ORDER — COCONUT OIL OIL: 1.0000 "application " | TOPICAL_OIL | Status: DC | PRN

## 2019-01-31 MED ORDER — SOD CITRATE-CITRIC ACID 500-334 MG/5ML PO SOLN: 30.0000 mL | ORAL | Status: DC | PRN

## 2019-01-31 MED ORDER — ONDANSETRON HCL 4 MG/2ML IJ SOLN: 4.0000 mg | INTRAMUSCULAR | Status: DC | PRN

## 2019-01-31 MED ORDER — OXYTOCIN 10 UNIT/ML IJ SOLN
INTRAMUSCULAR | Status: AC
  Filled 2019-01-31: qty 2

## 2019-01-31 MED ORDER — ACETAMINOPHEN 325 MG PO TABS: 650.0000 mg | ORAL_TABLET | ORAL | Status: DC | PRN

## 2019-01-31 MED ORDER — DIPHENHYDRAMINE HCL 25 MG PO CAPS: 25.0000 mg | ORAL_CAPSULE | Freq: Four times a day (QID) | ORAL | Status: DC | PRN

## 2019-01-31 MED ORDER — OXYTOCIN 40 UNITS IN NORMAL SALINE INFUSION - SIMPLE MED
2.5000 [IU]/h | INTRAVENOUS | Status: DC
  Administered 2019-01-31: 2.5 [IU]/h via INTRAVENOUS
  Filled 2019-01-31: qty 1000

## 2019-01-31 MED ORDER — WITCH HAZEL-GLYCERIN EX PADS: 1.0000 "application " | MEDICATED_PAD | CUTANEOUS | Status: DC

## 2019-01-31 MED ORDER — TERBUTALINE SULFATE 1 MG/ML IJ SOLN: 0.2500 mg | Freq: Once | INTRAMUSCULAR | Status: DC | PRN

## 2019-01-31 MED ORDER — LACTATED RINGERS IV SOLN: 500.0000 mL | INTRAVENOUS | Status: DC | PRN

## 2019-01-31 MED ORDER — ONDANSETRON HCL 4 MG/2ML IJ SOLN: 4.0000 mg | Freq: Four times a day (QID) | INTRAMUSCULAR | Status: DC | PRN

## 2019-01-31 MED ORDER — LACTATED RINGERS IV SOLN
INTRAVENOUS | Status: DC
  Administered 2019-01-31: 09:00:00 via INTRAVENOUS

## 2019-01-31 MED ORDER — LIDOCAINE HCL (PF) 1 % IJ SOLN
INTRAMUSCULAR | Status: AC
  Filled 2019-01-31: qty 30

## 2019-01-31 MED ORDER — OXYCODONE HCL 5 MG PO TABS
5.0000 mg | ORAL_TABLET | Freq: Once | ORAL | Status: AC | PRN
  Administered 2019-01-31: 5 mg via ORAL
  Filled 2019-01-31: qty 1

## 2019-01-31 MED ORDER — IBUPROFEN 600 MG PO TABS
600.0000 mg | ORAL_TABLET | Freq: Four times a day (QID) | ORAL | Status: DC
  Administered 2019-01-31 – 2019-02-01 (×4): 600 mg via ORAL
  Filled 2019-01-31 (×4): qty 1

## 2019-01-31 MED ORDER — ONDANSETRON HCL 4 MG PO TABS: 4.0000 mg | ORAL_TABLET | ORAL | Status: DC | PRN

## 2019-01-31 MED ORDER — BENZOCAINE-MENTHOL 20-0.5 % EX AERO
1.0000 "application " | INHALATION_SPRAY | CUTANEOUS | Status: DC | PRN
  Filled 2019-01-31: qty 56

## 2019-01-31 MED ORDER — AMMONIA AROMATIC IN INHA
RESPIRATORY_TRACT | Status: AC
  Filled 2019-01-31: qty 10

## 2019-01-31 MED ORDER — DIBUCAINE (PERIANAL) 1 % EX OINT: 1.0000 "application " | TOPICAL_OINTMENT | CUTANEOUS | Status: DC

## 2019-01-31 MED ORDER — OXYTOCIN 40 UNITS IN NORMAL SALINE INFUSION - SIMPLE MED
1.0000 m[IU]/min | INTRAVENOUS | Status: DC
  Administered 2019-01-31: 12:00:00 2 m[IU]/min via INTRAVENOUS

## 2019-01-31 MED ORDER — PRENATAL MULTIVITAMIN CH
1.0000 | ORAL_TABLET | Freq: Every day | ORAL | Status: DC
  Administered 2019-02-01: 1 via ORAL
  Filled 2019-01-31: qty 1

## 2019-01-31 MED ORDER — OXYTOCIN BOLUS FROM INFUSION
500.0000 mL | Freq: Once | INTRAVENOUS | Status: AC
  Administered 2019-01-31: 500 mL via INTRAVENOUS

## 2019-01-31 MED ORDER — FERROUS SULFATE 325 (65 FE) MG PO TABS
325.0000 mg | ORAL_TABLET | Freq: Every day | ORAL | Status: DC
  Administered 2019-02-01: 325 mg via ORAL
  Filled 2019-01-31: qty 1

## 2019-01-31 MED ORDER — FENTANYL CITRATE (PF) 100 MCG/2ML IJ SOLN
50.0000 ug | INTRAMUSCULAR | Status: DC | PRN
  Administered 2019-01-31: 50 ug via INTRAVENOUS
  Administered 2019-01-31: 13:00:00 100 ug via INTRAVENOUS
  Filled 2019-01-31 (×2): qty 2

## 2019-01-31 MED ORDER — MISOPROSTOL 200 MCG PO TABS
ORAL_TABLET | ORAL | Status: AC
  Filled 2019-01-31: qty 4

## 2019-01-31 MED ORDER — ACETAMINOPHEN 325 MG PO TABS
650.0000 mg | ORAL_TABLET | ORAL | Status: DC | PRN
  Administered 2019-01-31 – 2019-02-01 (×3): 650 mg via ORAL
  Filled 2019-01-31 (×3): qty 2

## 2019-01-31 MED ORDER — SIMETHICONE 80 MG PO CHEW: 160.0000 mg | CHEWABLE_TABLET | ORAL | Status: DC | PRN

## 2019-01-31 MED ORDER — OXYTOCIN 40 UNITS IN NORMAL SALINE INFUSION - SIMPLE MED
INTRAVENOUS | Status: AC
  Administered 2019-01-31: 2 m[IU]/min via INTRAVENOUS
  Filled 2019-01-31: qty 1000

## 2019-01-31 MED ORDER — LIDOCAINE HCL (PF) 1 % IJ SOLN: 30.0000 mL | INTRAMUSCULAR | Status: DC | PRN

## 2019-01-31 NOTE — Progress Notes (Signed)
Spoke with Sherlynn Stalls from IP and Dr. Baxter Flattery with infectious disease. Advised to discontinue isolation at this time.

## 2019-01-31 NOTE — Progress Notes (Signed)
Labor Progress Note  Jeanne [name] is a 28 y.o. [id number] at [redacted]w[redacted]d by LMP admitted for labor.  Subjective:  Starting to feel more vaginal pressure.   Objective: BP 129/79   Pulse 70   Temp 98.8 F (37.1 C) (Oral)   Resp 20   Ht 5' (1.524 m)   Wt 73.5 kg   LMP 04/25/2018   BMI 31.64 kg/m   Fetal Assessment: FHT:  FHR: 125 bpm, variability: moderate,  accelerations:  Present,  decelerations:  Absent Category/reactivity:  Category I UC:   irregular, every 2-7 minutes SVE:    Dilation: 6cm  Effacement: 80%  Station:  -2  Consistency: soft  Position: middle  Membrane status: AROM 1123 Amniotic color: clear  Labs: Lab Results  Component Value Date   WBC 8.2 01/31/2019   HGB 12.7 01/31/2019   HCT 39.3 01/31/2019   MCV 86.2 01/31/2019   PLT 145 (L) 01/31/2019    Assessment / Plan: Spontaneous labor, progressing normally  Labor: Progressing normally, s/p AROM now Fetal Wellbeing:  Category I Pain Control:  Maternal pain control as desired: IVPM, regional anesthesia I/D:  COVID-19 positive, precautions in place Anticipated MOD:  NSVD  Lisette Grinder, CNM 01/31/2019, 11:40 AM

## 2019-01-31 NOTE — Discharge Instructions (Signed)

## 2019-01-31 NOTE — Plan of Care (Signed)
Vaginally delivered viable female at 72.

## 2019-01-31 NOTE — OB Triage Note (Signed)
Pt is a G4P2 and [redacted]w[redacted]d today presenting to L&D with c/o ctx starting around 0000 about 6-7 minutes apart. Pt confirms positive fetal movement and denies any LOF or vaginal bleeding. Vital signs WDL. Will continue to monitor.

## 2019-01-31 NOTE — Progress Notes (Signed)
Spoke with Infection Prevention on call, Sherlynn Stalls, regarding patient's isolation status. Patient tested positive on 10/26/2018 and re tested positive again today, but is asymptomatic. Esther to speak with Dr. Baxter Flattery ID MD regarding isolation status.

## 2019-01-31 NOTE — H&P (Addendum)
OB History & Physical   History of Present Illness:  Chief Complaint: contractions  HPI:  Jeanne [name] is a 28 y.o. [phone] female at [redacted]w[redacted]d dated by LMP c/w [redacted]w[redacted]d ultrasound.  She presents to L&D with contractions since around midnight, getting stronger and more regular.   She reports:  -active fetal movement -no leakage of fluid -no vaginal bleeding  Pregnancy Issues: 1. Anemia on iron supplementation 2. History of preeclampsia in prior pregnancy 3. COVID-19 illness during pregnancy, with hospitalization 4. Adjustment disorder/history of depression 5. History of postpartum hemorrhage   Maternal Medical History:   Past Medical History:  Diagnosis Date  . Anemia     Past Surgical History:  Procedure Laterality Date  . DILATION AND EVACUATION N/A 02/09/2015   Procedure: DILATATION AND EVACUATION;  Surgeon: Benjaman Kindler, MD;  Location: [facility] ORS;  Service: Gynecology;  Laterality: N/A;  . LAPAROSCOPY  02/09/2015   Procedure: LAPAROSCOPY DIAGNOSTIC;  Surgeon: Benjaman Kindler, MD;  Location: [facility] ORS;  Service: Gynecology;;    No Known Allergies  Prior to Admission medications   Medication Sig Start Date End Date Taking? Authorizing Provider  ferrous sulfate 324 (65 Fe) MG TBEC Take 1 tablet (325 mg total) by mouth daily. 11/12/18  Yes Sciora, Real Cons, CNM  Prenatal Vit-Fe Fumarate-FA (MULTIVITAMIN-PRENATAL) 27-0.8 MG TABS tablet Take 1 tablet by mouth daily at 12 noon.   Yes [provider]  albuterol (VENTOLIN HFA) 108 (90 Base) MCG/ACT inhaler Inhale 2 puffs into the lungs every 6 (six) hours as needed for wheezing or shortness of breath. Patient not taking: Reported on 11/12/2018 10/27/18   Loletha Grayer, MD  aspirin EC 81 MG tablet Take 81 mg by mouth daily. Preeclampsia prevention    [provider]     Prenatal care site: [facility]   Social History: She  reports that she has never smoked. She has never used  smokeless tobacco. She reports previous alcohol use. She reports that she does not use drugs.  Family History: family history is not on file.   Review of Systems: A full review of systems was performed and negative except as noted in the HPI.    Physical Exam:  Vital Signs: BP 126/81 (BP Location: Right Arm)   Pulse (!) 58   Temp 97.9 F (36.6 C) (Oral)   Resp 16   Ht 5' (1.524 m)   Wt 73.5 kg   LMP 04/25/2018   BMI 31.64 kg/m   General:   alert, cooperative and appears stated age  Skin:  normal and no rash or abnormalities  Neurologic:    Alert & oriented x 3  Lungs:   clear to auscultation bilaterally  Heart:   regular rate and rhythm, S1, S2 normal, no murmur, click, rub or gallop  Abdomen:  soft, non-tender; bowel sounds normal; no masses,  no organomegaly  Pelvis:  Exam deferred.  FHT:  125 BPM  Presentations: cephalic  Cervix:    Dilation: 4cm   Effacement: 50%   Station:  -2   Consistency: soft   Position: posterior  Extremities: : non-tender, symmetric, no edema bilaterally.     EFW: 7lb 12oz   Pertinent Results:  Prenatal Labs: Blood type/Rh O+  Antibody screen neg  Rubella Immune  Varicella Immune  RPR NR  HBsAg Neg  HIV NR  GC neg  Chlamydia neg  Genetic screening cfDNA negative, XY fetus  1 hour GTT 91  3 hour GTT n/a  GBS  negative   FHT: FHR: 120-125 bpm, variability: moderate,  accelerations:  Present,  decelerations:  Present variable Category/reactivity:  Category II TOCO: irregular, every 2-7 minutes  Assessment:  Jeanne [name] is a 28 y.o. [phone] female at [redacted]w[redacted]d with labor.   Plan:  1. Admit to Labor & Delivery; consents reviewed and obtained  2. Fetal Well being  - Fetal Tracing: category II, overall reassuring - GBS negative - Presentation: cephalic confirmed by Leopold's    3. Routine OB: - Prenatal labs reviewed, as above - Rh positive - CBC & T&S on admit - Clear fluids, IVF  4. Monitoring of Labor -   Contractions to be monitored with external toco in place -  Pelvis proven to 8lb  -  Plan for augmentation with AROM and/or pitocin -  Plan for continuous fetal monitoring  -  Maternal pain control as desired: IVPM, regional anesthesia - Anticipate vaginal delivery  5. Post Partum Planning: - Infant feeding: breast - Contraception: IUD  Genia Del, CNM 01/31/2019 9:06 AM ----- Genia Del Certified Nurse Midwife [facility], Department of OB/GYN [facility]

## 2019-01-31 NOTE — Progress Notes (Signed)
Spoke with Dr. Carlyle Basques of Infectious Disease. Per their protocol, patient is not required to be maintained in isolation precaution at this time. Per Dr. Baxter Flattery, current data suggests that her positive test today was likely due to viral remnants from her original infection in August. That being said, unless the patient's current isolation room is needed for another patient, Dr. Baxter Flattery states that is it reasonable to keep her in that room until discharge, and staff can continue to use N95s if they feel more comfortable doing so.  Lisette Grinder, [place] 01/31/2019 7:59 PM

## 2019-01-31 NOTE — Discharge Summary (Addendum)
Obstetric Discharge Summary   Patient Name: Jeanne [name] DOB: May 29, 1990 MRN: [id number]  Date of Admission: 01/31/2019 Date of Delivery: 01/31/2019 Delivered by: Genia Del, CNM Date of Discharge: 02/01/2019  Primary OB:  [facility]  ENI:DPOEUMP'N last menstrual period was 04/25/2018. EDC Estimated Date of Delivery: 01/30/19 Gestational Age at Delivery: [redacted]w[redacted]d   Antepartum complications:  1. Anemia on iron supplementation 2. History of preeclampsia in prior pregnancy 3. COVID-19 illness during pregnancy, with hospitalization 4. Adjustment disorder/history of depression 5. History of postpartum hemorrhage  Admitting Diagnosis: labor  Secondary Diagnoses: Patient Active Problem List   Diagnosis Date Noted  . Post-dates pregnancy 01/31/2019  . Obesity affecting pregnancy BMI=31.7 01/27/2019  . Anemia affecting pregnancy in third trimester 11/12/2018  . COVID-19 virus detected 10/27/2018  . Shortness of breath during pregnancy 10/26/2018  . Supervision of other normal pregnancy, antepartum 09/09/2018  . History of postpartum hemorrhage 09/09/2018  . Hx of preeclampsia, prior pregnancy, currently pregnant 09/09/2018  . Adjustment disorder 09/09/2018    Augmentation: AROM and Pitocin Complications: None Intrapartum complications/course: Kimiyah presented to L&D with contractions. She was augmented with AROM and pitocin. IV fentanyl given for pain relief. She progressed to complete and had a spontaneous vaginal birth of a live female over an intact perineum. The fetal head was delivered in direct OA position, rotating to direct OP, then restituting to ROP. One loose loop of nuchal cord, reduced on the perineum. Anterior then posterior shoulders delivered with minimal assistance. Baby placed on mom's abdomen and attended to by transition RN. Cord clamped and cut when pulseless by father of the baby. Cord blood obtained for newborn labs. Placenta delivered spontaneously intact  with 3VC. IV pitocin given for hemorrhage prophylaxis. No laceration identified. EBL .   Delivery Type: spontaneous vaginal delivery Anesthesia: IVPM Placenta: spontaneous Laceration: none Episiotomy: none  Newborn Data: Live born female "Duwayne Heck" Birth Weight: 3760g 8lb 4.6oz APGAR: 8, 9  Newborn Delivery   Birth date/time: 01/31/2019 13:58:00 Delivery type: Vaginal, Spontaneous      Postpartum Course  Patient had an uncomplicated postpartum course.  By time of discharge on PPD#1, her pain was controlled on oral pain medications; she had appropriate lochia and was ambulating, voiding without difficulty and tolerating regular diet.  She was deemed stable for discharge to home. Dr. Alvester Morin at [facility] made aware of mildly low platelet count for postpartum monitoring.      Labs: CBC Latest Ref Rng & Units 02/01/2019 01/31/2019 11/12/2018  WBC 4.0 - 10.5 K/uL 8.6 8.2 7.6  Hemoglobin 12.0 - 15.0 g/dL 10.8(L) 12.7 10.3(L)  Hematocrit 36.0 - 46.0 % 33.3(L) 39.3 31.0(L)  Platelets 150 - 400 K/uL 129(L) 145(L) 183   O POS  Physical exam:  BP 115/73 (BP Location: Right Arm)   Pulse 61   Temp 98 F (36.7 C) (Oral)   Resp 16   Ht 5' (1.524 m)   Wt 73.5 kg   LMP 04/25/2018   Breastfeeding Unknown   BMI 31.64 kg/m  General: alert and no distress Pulm: normal respiratory effort Lochia: appropriate Abdomen: soft, NT Uterine Fundus: firm, below umbilicus Extremities: No evidence of DVT seen on physical exam. No lower extremity edema.   Disposition: stable, discharge to home Baby Feeding: breastmilk & formula  Baby Disposition: home with mom  Contraception: IUD  Prenatal Labs:  Blood type/Rh O+  Antibody screen neg  Rubella Immune  Varicella Immune  RPR NR  HBsAg Neg  HIV NR  GC neg  Chlamydia  neg  Genetic screening cfDNA negative, XY fetus  1 hour GTT 91  3 hour GTT n/a  GBS negative  COVID-19 positive   Rh Immune globulin given: n/a Rubella vaccine given:  n/a Varicella vaccine given: n/a Tdap vaccine given in AP or PP setting: 11/15/2018 Flu vaccine given in AP or PP setting: 12/24/2018  Plan:  Lum Babe M Wing Gfeller was discharged to home in good condition. Follow-up appointment at [facility] with delivery provider or OB providers in 6 weeks.  Discharge Instructions: Per After Visit Summary. Activity: Advance as tolerated. Pelvic rest for 6 weeks.   Diet: Regular Discharge Medications: Allergies as of 02/01/2019   No Known Allergies     Medication List    STOP taking these medications   aspirin EC 81 MG tablet   ferrous sulfate 324 (65 Fe) MG Tbec Replaced by: ferrous sulfate 325 (65 FE) MG tablet     TAKE these medications   acetaminophen 500 MG tablet Commonly known as: Tylenol Take 2 tablets (1,000 mg total) by mouth every 6 (six) hours as needed for mild pain or moderate pain.   albuterol 108 (90 Base) MCG/ACT inhaler Commonly known as: VENTOLIN HFA Inhale 2 puffs into the lungs every 6 (six) hours as needed for wheezing or shortness of breath.   ferrous sulfate 325 (65 FE) MG tablet Take 1 tablet (325 mg total) by mouth 2 (two) times daily with a meal. Replaces: ferrous sulfate 324 (65 Fe) MG Tbec   ibuprofen 600 MG tablet Commonly known as: ADVIL Take 1 tablet (600 mg total) by mouth every 6 (six) hours as needed for mild pain, moderate pain or cramping.   multivitamin-prenatal 27-0.8 MG Tabs tablet Take 1 tablet by mouth daily at 12 noon.      Outpatient follow up:  Follow-up Information    [facility] DEPT. Schedule an appointment as soon as possible for a visit in 6 week(s).   Why: For routine postpartum visit Contact information: [address] [place] [address] [phone]       Department, [facility]. Call in 1 week(s).   Why: Please call to schedule a blood draw to check your blood count Contact information: [place] [phone] [phone]            Signed:  Lisette Grinder, CNM 02/01/2019 3:29 PM

## 2019-02-01 LAB — CBC
HCT: 33.3 % — ABNORMAL LOW (ref 36.0–46.0)
Hemoglobin: 10.8 g/dL — ABNORMAL LOW (ref 12.0–15.0)
MCH: 28.3 pg (ref 26.0–34.0)
MCHC: 32.4 g/dL (ref 30.0–36.0)
MCV: 87.2 fL (ref 80.0–100.0)
Platelets: 129 10*3/uL — ABNORMAL LOW (ref 150–400)
RBC: 3.82 MIL/uL — ABNORMAL LOW (ref 3.87–5.11)
RDW: 16.5 % — ABNORMAL HIGH (ref 11.5–15.5)
WBC: 8.6 10*3/uL (ref 4.0–10.5)
nRBC: 0 % (ref 0.0–0.2)

## 2019-02-01 LAB — RPR: RPR Ser Ql: NONREACTIVE

## 2019-02-01 MED ORDER — IBUPROFEN 600 MG PO TABS: 600.0000 mg | ORAL_TABLET | Freq: Four times a day (QID) | ORAL | 0 refills | Status: DC | PRN

## 2019-02-01 MED ORDER — FERROUS SULFATE 325 (65 FE) MG PO TABS: 325.0000 mg | ORAL_TABLET | Freq: Two times a day (BID) | ORAL | 0 refills | Status: DC

## 2019-02-01 MED ORDER — ACETAMINOPHEN 500 MG PO TABS: 1000.0000 mg | ORAL_TABLET | Freq: Four times a day (QID) | ORAL | 0 refills | Status: DC | PRN

## 2019-02-01 NOTE — Plan of Care (Addendum)
Order to discharge patient to home. IV removed per policy and discharge instructions gone over with patient and husband. Patient verbalized understating of instructions and understand when to call and or return to the hospital.

## 2019-02-01 NOTE — Progress Notes (Signed)
Post Partum Day 1  Subjective: Doing well, no concerns. Ambulating without difficulty, pain managed with PO meds, tolerating regular diet, and voiding without difficulty.   No fever/chills, chest pain, shortness of breath, nausea/vomiting, or leg pain. No nipple or breast pain.  Objective: BP 115/73 (BP Location: Right Arm)   Pulse 61   Temp 98 F (36.7 C) (Oral)   Resp 16   Ht 5' (1.524 m)   Wt 73.5 kg   LMP 04/25/2018   Breastfeeding Unknown   BMI 31.64 kg/m    Physical Exam:  General: alert, cooperative, appears stated age and no distress Breasts: soft/nontender CV: RRR Pulm: nl effort, CTABL Abdomen: soft, non-tender, active bowel sounds Uterine Fundus: firm Lochia: appropriate DVT Evaluation: No evidence of DVT seen on physical exam. No cords or calf tenderness. No significant calf/ankle edema.  Recent Labs    01/31/19 0912 02/01/19 0752  HGB 12.7 10.8*  HCT 39.3 33.3*  WBC 8.2 8.6  PLT 145* 129*    Assessment/Plan: 28 y.o. [id number] postpartum day # 1  -Continue routine postpartum care -Acute blood loss anemia - hemodynamically stable and asymptomatic; start PO ferrous sulfate BID with stool softeners  -Immunization status:  all immunizations up to date -Plan for repeat platelet count in 1-2 weeks as an outpatient  Disposition: Desires discharge home today   LOS: 1 day   Lisette Grinder, CNM 02/01/2019, 9:25 AM   ----- Lisette Grinder Certified Nurse Midwife [facility]

## 2019-02-02 DIAGNOSIS — D696 Thrombocytopenia, unspecified: Secondary | ICD-10-CM | POA: Insufficient documentation

## 2019-02-03 ENCOUNTER — Ambulatory Visit: Payer: Medicaid Other

## 2019-02-03 LAB — SURGICAL PATHOLOGY

## 2019-02-16 ENCOUNTER — Telehealth: Payer: Self-pay

## 2019-02-16 NOTE — Telephone Encounter (Signed)
Call to client-per E. Sciora, CNM-scheduled 02/17/19 for lab to redraw platelets Debera Lat, RN

## 2019-02-17 ENCOUNTER — Other Ambulatory Visit: Payer: Self-pay

## 2019-02-17 ENCOUNTER — Other Ambulatory Visit: Payer: Medicaid Other

## 2019-02-17 DIAGNOSIS — D696 Thrombocytopenia, unspecified: Secondary | ICD-10-CM | POA: Diagnosis not present

## 2019-02-17 NOTE — Progress Notes (Signed)
Presents to [facility] for CBC 17 days post-partum as follow-up to labs while hospitalized for delivery. Client counseled results most likely available tomorrow, but may be Tuesday at the earliest before notified of results due to agency closure for Christmas holiday.Rich Number, RN

## 2019-02-18 LAB — CBC
Hematocrit: 36.9 % (ref 34.0–46.6)
Hemoglobin: 12 g/dL (ref 11.1–15.9)
MCH: 28.2 pg (ref 26.6–33.0)
MCHC: 32.5 g/dL (ref 31.5–35.7)
MCV: 87 fL (ref 79–97)
Platelets: 277 10*3/uL (ref 150–450)
RBC: 4.26 x10E6/uL (ref 3.77–5.28)
RDW: 15.3 % (ref 11.7–15.4)
WBC: 5.3 10*3/uL (ref 3.4–10.8)

## 2019-02-18 NOTE — Progress Notes (Signed)
Reviewed 02/17/19 CBC for f/u thrombocytopenia. All now WNL, including PLT = 277K.

## 2019-03-09 ENCOUNTER — Encounter: Payer: Self-pay | Admitting: Advanced Practice Midwife

## 2019-03-13 ENCOUNTER — Encounter: Payer: Self-pay | Admitting: Family Medicine

## 2019-03-13 ENCOUNTER — Ambulatory Visit: Payer: Medicaid Other | Admitting: Family Medicine

## 2019-03-13 ENCOUNTER — Other Ambulatory Visit: Payer: Self-pay

## 2019-03-13 VITALS — BP 102/56 | Ht 60.0 in | Wt 143.2 lb

## 2019-03-13 DIAGNOSIS — Z30011 Encounter for initial prescription of contraceptive pills: Secondary | ICD-10-CM

## 2019-03-13 DIAGNOSIS — D649 Anemia, unspecified: Secondary | ICD-10-CM

## 2019-03-13 LAB — HEMOGLOBIN, FINGERSTICK: Hemoglobin: 11.9 g/dL (ref 11.1–15.9)

## 2019-03-13 MED ORDER — THERA VITAL M PO TABS: 1.0000 | ORAL_TABLET | Freq: Every day | ORAL | 3 refills | Status: AC

## 2019-03-13 MED ORDER — NORETHINDRONE 0.35 MG PO TABS: 1.0000 | ORAL_TABLET | Freq: Every day | ORAL | 11 refills | Status: DC

## 2019-03-13 NOTE — Progress Notes (Signed)
In for PP visit; desires IUD, considering Paraguard; declines HIV/RPR testing Sharlette Dense, RN

## 2019-03-13 NOTE — Progress Notes (Signed)
Post Partum Exam  Jeanne [name] is a 29 y.o. [phone] female who presents for a postpartum visit. She is 6 weeks postpartum following a spontaneous vaginal delivery. I have fully reviewed the prenatal and intrapartum course. The delivery was at [redacted]w[redacted]d gestational weeks.  Anesthesia: none. Postpartum course has been uncomplicated. Baby's course has been uncomplicated. Baby is feeding by Bottle and Breast Bleeding thin lochia. Bowel function is normal. Bladder function is normal. Patient is not sexually active. Contraception method is IUD unsure if she wants today.    Postpartum depression screening: Neg   The following portions of the patient's history were reviewed and updated as appropriate: allergies, current medications, past family history, past medical history, past social history, past surgical history and problem list. Last pap smear done-2018  and was Normal  Review of Systems Pertinent items are noted in HPI.    Objective:  BP (!) 102/56   Ht 5' (1.524 m)   Wt 143 lb 3.2 oz (65 kg)   Breastfeeding Yes Comment: Breast/Bottle  BMI 27.97 kg/m   Gen: well appearing, NAD HEENT: no scleral icterus CV: RR Lung: Normal WOB Breast:performed-no  Ext: warm well perfused  GU: not indicated Rectal: performed -  not indicated       Assessment:    normal  postpartum exam. Pap smear not done at today's visit.   Plan:   1. Contraception: Contraception counseling: Reviewed all forms of birth control options in the tiered based approach. available including abstinence; over the counter/barrier methods; hormonal contraceptive medication including pill, patch, ring, injection,contraceptive implant; hormonal and nonhormonal IUDs; permanent sterilization options including vasectomy and the various tubal sterilization modalities. Risks, benefits, and typical effectiveness rates were reviewed.  Questions were answered.  Written information was also given to the patient to review.  Patient  desires OCP-- given progesterone only supply for 1 year. She will follow up in  3 months-1 year for surveillance and or if she decides she wants an IUD.  She was told to call with any further questions, or with any concerns about this method of contraception.  Emphasized use of condoms 100% of the time for STI prevention.  2. Infant feeding:  patient is currently feeding with both breasmilk and formula.  Does not need letter for employer  -Recommended patient engage with WIC/BFpeer counselors  -Counseled to sign new child up for [facility] services 3. Mood: EPDS is low risk. Reviewed resources and that mood sx in first year after pregnancy are considered related to pregnancy and to reach out for help at [facility] if needed. Discussed [facility] as link to care and availability of LCSW for counseling  4. Chronic Medical Conditions:  Low PLT- recheck today- WNL  Anemia- recheck today - WNL  Orders Placed This Encounter  Procedures  . Hemoglobin, venipuncture  . CBC   Patient given handout about PCP care in the community Given MVI per family planning program guidelines and availability  Follow up in: 1 year  or as needed sooner to change contraceptive method.

## 2019-03-14 LAB — CBC
Hematocrit: 35.7 % (ref 34.0–46.6)
Hemoglobin: 11.9 g/dL (ref 11.1–15.9)
MCH: 28.6 pg (ref 26.6–33.0)
MCHC: 33.3 g/dL (ref 31.5–35.7)
MCV: 86 fL (ref 79–97)
Platelets: 248 10*3/uL (ref 150–450)
RBC: 4.16 x10E6/uL (ref 3.77–5.28)
RDW: 15 % (ref 11.7–15.4)
WBC: 6.2 10*3/uL (ref 3.4–10.8)

## 2019-04-29 ENCOUNTER — Encounter: Payer: Self-pay | Admitting: Advanced Practice Midwife

## 2019-04-29 ENCOUNTER — Other Ambulatory Visit: Payer: Self-pay

## 2019-04-29 ENCOUNTER — Telehealth: Payer: Self-pay

## 2019-04-29 NOTE — Telephone Encounter (Signed)
Call to client in response to her concern about missed birth control pill and possible pregnancy. Left message to call and number to call provided. Jossie Ng, RN

## 2019-05-14 ENCOUNTER — Encounter: Payer: Self-pay | Admitting: Advanced Practice Midwife

## 2019-09-23 ENCOUNTER — Telehealth: Payer: Self-pay

## 2019-09-23 ENCOUNTER — Encounter: Payer: Self-pay | Admitting: Advanced Practice Midwife

## 2019-09-23 NOTE — Telephone Encounter (Signed)
Call to client and appt scheduled for tomorrow with arrival time of 2:00 pm. Jossie Ng, RN

## 2019-09-23 NOTE — Telephone Encounter (Signed)
Client desires appt due to bleeding and concerns regarding other issues. Post-partum exam was done 2021. Call to client and left message to call and speak with RN so appt could be scheduled. (See messages). Jossie Ng, RN

## 2019-09-24 ENCOUNTER — Other Ambulatory Visit: Payer: Self-pay

## 2019-09-24 ENCOUNTER — Ambulatory Visit (LOCAL_COMMUNITY_HEALTH_CENTER): Payer: Medicaid Other | Admitting: Physician Assistant

## 2019-09-24 ENCOUNTER — Encounter: Payer: Self-pay | Admitting: Physician Assistant

## 2019-09-24 VITALS — Ht 60.0 in | Wt 133.2 lb

## 2019-09-24 DIAGNOSIS — Z3009 Encounter for other general counseling and advice on contraception: Secondary | ICD-10-CM

## 2019-09-24 LAB — PREGNANCY, URINE: Preg Test, Ur: NEGATIVE

## 2019-09-24 NOTE — Progress Notes (Signed)
[employer] results reviewed. Per standing orders no treatment indicated. PT negative. Condoms declined. Tawny Hopping, RN

## 2019-09-24 NOTE — Progress Notes (Signed)
Here today for concerns of vaginal bleeding and abdominal cramping since 09/20/19. Rates pain at a 7 out of 10.  Had PP exam 03/13/2019. Last Pap Smear was 04/02/2016. States is breastfeeding 29 month old baby. Currently taking OCP's for birth control. Tawny Hopping, RN

## 2019-09-24 NOTE — Progress Notes (Signed)
   [facility] problem [facility]  [facility]  Subjective:  Jeanne [name] is a 29 y.o. being seen today for   Chief Complaint  Patient presents with  . Menstrual Problem    29 yo woman s/p term delivery of son 01/2019 desires change in [facility]. Had pp exam 03/13/19, last Pap 03/22/16 (NIL). Is breastfeeding about 6 times daily with food supplementation. Notes occ cramping and spotting during/after breastfeeding. Has not had a frank menstrual period since delivery. Last DMPA 11 wk ago. Desires switch to OCPs. Wants regular menstrual cycles. Denies smoking, migraine hx, FH of cancer.   Does the patient have a current or past history of drug use? No   No components found for: HCV]   Health Maintenance Due  Topic Date Due  . Hepatitis C Screening  Never done  . COVID-19 Vaccine (1) Never done  . PAP-Cervical Cytology Screening  03/27/2019  . PAP SMEAR-Modifier  03/27/2019    Review of Systems  Constitutional: Negative.   Cardiovascular: Negative.   Genitourinary:       See HPI.  Neurological: Negative.     The following portions of the patient's history were reviewed and updated as appropriate: allergies, current medications, past family history, past medical history, past social history, past surgical history and problem list. Problem list updated.   See flowsheet for other program required questions.  Objective:   Vitals:   09/24/19 1417  Weight: 133 lb 3.2 oz (60.4 kg)  Height: 5' (1.524 m)    Physical Exam Vitals and nursing note reviewed. Exam conducted with a chaperone present.  Constitutional:      General: She is not in acute distress.    Appearance: Normal appearance. She is normal weight.  Genitourinary:    Exam position: Lithotomy position.     Labia:        Right: No rash or tenderness.        Left: No rash or tenderness.      Urethra: No urethral lesion.     Vagina: No signs of injury. No tenderness or lesions.      Cervix: No lesion or cervical bleeding.     Uterus: Normal. Not tender.      Adnexa: Right adnexa normal and left adnexa normal.     Rectum: No external hemorrhoid.     Comments: sm amt brown vag discharge. Lymphadenopathy:     Lower Body: No right inguinal adenopathy. No left inguinal adenopathy.  Neurological:     Mental Status: She is oriented to person, place, and time.       Assessment and Plan:  Emelina Hinch Weronika Birch is a 29 y.o. female presenting to the [facility] Department for a Women's Health problem visit  1. Family planning services UPT = neg. After discussion of contraceptive options via tiered method, pt desires OCPs. eRx COCPs given in light of well-established breastfeeding. Counseled may have impending menses, but may still be irregular due to breastfeeding. Rec ibuprofen per package instructions prn cramping. RTC 10 mo for annual well-woman exam. - Pap IG w/ reflex to HPV when ASC-U - Chlamydia/Gonorrhea [facility] - Pregnancy, urine     No follow-ups on file.  No future appointments.  Landry Dyke, PA-C

## 2019-09-27 LAB — IGP, RFX APTIMA HPV ASCU: PAP Smear Comment: 0

## 2019-10-08 NOTE — Addendum Note (Signed)
Addended by: Heywood Bene on: 10/08/2019 04:06 PM   Modules accepted: Orders

## 2020-03-06 ENCOUNTER — Other Ambulatory Visit: Payer: Self-pay | Admitting: Family Medicine

## 2020-03-06 DIAGNOSIS — Z30011 Encounter for initial prescription of contraceptive pills: Secondary | ICD-10-CM

## 2023-02-27 NOTE — L&D Delivery Note (Signed)
 Delivery Note   Jeanne [name] is a 33 y.o. [id number] at [redacted]w[redacted]d Estimated Date of Delivery: 01/18/24  PRE-OPERATIVE DIAGNOSIS:  1) [redacted]w[redacted]d pregnancy.   POST-OPERATIVE DIAGNOSIS:  1) [redacted]w[redacted]d pregnancy s/p Vaginal, Spontaneous   Delivery Type: Vaginal, Spontaneous   Delivery Anesthesia: Epidural  Labor Complications: N A      ESTIMATED BLOOD LOSS: 400 ml    FINDINGS:   Information for the patient's newborn:  Aella, Ronda [[id number]]  Live born female  Birth Weight:   APGAR: ,   Newborn Delivery   Birth date/time: 01/[birth year]/2024 10:44:00 Delivery type: Vaginal, Spontaneous      SPECIMENS:   PLACENTA:   Appearance: Intact   Removal: Spontaneous     Disposition: discarded  Cord Blood: collected for typing, mother of baby O POS  DISPOSITION:  Infant left in stable condition in the delivery room, with L&D personnel and mother,  NARRATIVE SUMMARY: Labor course:  Jeanne [name] is a [id number] at [redacted]w[redacted]d who presented to Labor & Delivery for observation. Her initial cervical exam was 4/80/-2. Labor proceeded spontaneously, AROM at 1003.  she was found to be completely dilated at 1032. With excellent maternal pushing effort, she birthed a viable female infant at 52. Head birthed DOA, restituted to LOA. There was not a nuchal cord. The shoulders were birthed without difficulty. The infant was placed skin-to-skin on mother's abdomen. The cord was doubly clamped and cut when pulsations ceased. The placenta delivered spontaneously and was noted to be intact with a 3VC. A perineal and vaginal examination was performed. Episiotomy/Lacerations: 1st degree;Perineal Lacerations were repaired with Vicryl suture using epidural anesthesia. Tolerated well. Mother and baby left in stable condition.   Jeanne [name], CNM 01/21/2024 11:42 AM

## 2023-06-11 ENCOUNTER — Ambulatory Visit

## 2023-06-11 VITALS — BP 107/55 | Ht 64.0 in | Wt 139.5 lb

## 2023-06-11 DIAGNOSIS — Z3201 Encounter for pregnancy test, result positive: Secondary | ICD-10-CM | POA: Diagnosis not present

## 2023-06-11 DIAGNOSIS — Z308 Encounter for other contraceptive management: Secondary | ICD-10-CM

## 2023-06-11 LAB — PREGNANCY, URINE: Preg Test, Ur: POSITIVE — AB

## 2023-06-11 MED ORDER — PRENATAL 27-0.8 MG PO TABS: 1.0000 | ORAL_TABLET | Freq: Every day | ORAL | Status: DC

## 2023-06-11 NOTE — Progress Notes (Signed)
 UPT positive. Patient reports having abdominal cramping (lasting from 12PM to nightime), vomiting, and headaches 06/10/23. Patient states she is feeling much better today. Consulted with Bohdan Bush, MD who advises that as long as there was no fluid leakage or vaginal bleeding, these symptoms may be normal. Educated patient on this and patient verbalizes understanding. Plans to receive care at [facility]; sent to clerk for preadmission.   The patient was dispensed prenatal vitamins #100 today. I provided counseling today regarding the medication. We discussed the medication, the side effects and when to call clinic.   Positive pregnancy packet reviewed and given to patient. Also counseled on hydration and when to seek medical attention.   Patient given the opportunity to ask questions. Questions answered.   Clare Critchley, RN

## 2023-07-02 ENCOUNTER — Encounter

## 2023-07-02 ENCOUNTER — Encounter: Payer: Self-pay | Admitting: Nurse Practitioner

## 2023-07-02 ENCOUNTER — Ambulatory Visit: Admitting: Nurse Practitioner

## 2023-07-02 VITALS — BP 111/58 | HR 69 | Temp 97.9°F | Wt 140.8 lb

## 2023-07-02 DIAGNOSIS — Z3481 Encounter for supervision of other normal pregnancy, first trimester: Secondary | ICD-10-CM

## 2023-07-02 DIAGNOSIS — Z8759 Personal history of other complications of pregnancy, childbirth and the puerperium: Secondary | ICD-10-CM

## 2023-07-02 DIAGNOSIS — Z3480 Encounter for supervision of other normal pregnancy, unspecified trimester: Secondary | ICD-10-CM | POA: Insufficient documentation

## 2023-07-02 DIAGNOSIS — Z3483 Encounter for supervision of other normal pregnancy, third trimester: Secondary | ICD-10-CM | POA: Diagnosis not present

## 2023-07-02 LAB — HEMOGLOBIN, FINGERSTICK: Hemoglobin: 11.8 g/dL (ref 11.1–15.9)

## 2023-07-02 NOTE — Progress Notes (Addendum)
 Here today for 11.3 week MH IP. Taking PNV every day. Did go to Urgent Care 06/19/23 for constipation states has since improved. Declines Flu vaccine today. Wants Mat 21. Declines Centering pregnancy due to work schedule. Eden Goodpasture, RN  Hgb 11.8. [facility] ultrasound appt date and time given with instructions to drink 3 large glasses of water and not to use restroom until after scan is complete. Eden Goodpasture, RN

## 2023-07-02 NOTE — Progress Notes (Signed)
 [facility] [address], [address] [place] [address] Main phone: [phone]  Initial Prenatal Visit  Subjective:  Jeanne [name] is a 33 y.o. [id number] at [redacted]w[redacted]d being seen today to start prenatal care at the [facility]. She is currently monitored for the following issues for this low-risk pregnancy:   Patient Active Problem List   Diagnosis Date Noted   Supervision of other normal pregnancy, antepartum 07/02/2023   Anemia 11/12/2018   History of COVID-19 10/27/2018   Adjustment disorder 09/09/2018   Patient reports  constipation .  Constipation has resolved recently after taking colace and miralax. Contractions: Not present. Vag. Bleeding: None.  Movement: Absent. Denies leaking of fluid.   Indications for ASA therapy One of the following: Previous pregnancy with preeclampsia, especially early onset and with an adverse outcome No  Multifetal gestation No  Chronic hypertension No  Type 1 or 2 diabetes mellitus No  Chronic kidney disease No  Autoimmune disease (antiphospholipid syndrome, systemic lupus erythematosus) No   Two or more of the following: Nulliparity  No  Obesity (body mass index >30 kg/m2) No  Family history of preeclampsia in mother or sister No  Age >=35 years No  Sociodemographic characteristics (African American race, low socioeconomic level) Yes  Personal risk factors (eg, previous pregnancy with low birth weight or small for gestational age infant, previous adverse pregnancy outcome [eg, stillbirth], interval >10 years between pregnancies) No   The following portions of the patient's history were reviewed and updated as appropriate: allergies, current medications, past family history, past medical history, past social history, past surgical history and problem list. Problem list updated.  Objective:   Vitals:   07/02/23 1313  BP: (!) 111/58  Pulse: 69  Temp:  97.9 F (36.6 C)  Weight: 140 lb 12.8 oz (63.9 kg)   Fetal Status: Fetal Heart Rate (bpm):  (unable to locate) Fundal Height:  (12-16) Movement: Absent      Physical Exam Vitals and nursing note reviewed. Chaperone present: declined.  Constitutional:      General: She is not in acute distress.    Appearance: Normal appearance. She is well-developed.  HENT:     Head: Normocephalic and atraumatic.     Right Ear: External ear normal.     Left Ear: External ear normal.     Nose: Nose normal. No congestion or rhinorrhea.     Mouth/Throat:     Lips: Pink.     Mouth: Mucous membranes are moist.     Dentition: Dental caries present.     Pharynx: Oropharynx is clear. Uvula midline.     Comments: Dentition: a few missing teeth Eyes:     General: No scleral icterus.    Conjunctiva/sclera: Conjunctivae normal.  Neck:     Thyroid: No thyroid mass or thyromegaly.  Cardiovascular:     Rate and Rhythm: Normal rate.     Pulses: Normal pulses.     Comments: Extremities are warm and well perfused Pulmonary:     Effort: Pulmonary effort is normal.     Breath sounds: Normal breath sounds.  Chest:     Chest wall: No mass.  Breasts:    Tanner Score is 5.     Breasts are symmetrical.     Right: Normal. No mass, nipple discharge or skin change.     Left: Normal. No mass, nipple discharge or skin change.  Abdominal:     General: Abdomen is flat.  Palpations: Abdomen is soft.     Tenderness: There is no abdominal tenderness.     Comments: Gravid   Genitourinary:    General: Normal vulva.     Exam position: Lithotomy position.     Pubic Area: No rash.      Labia:        Right: No rash.        Left: No rash.      Vagina: Normal. No vaginal discharge.     Cervix: Friability present. No cervical motion tenderness.     Uterus: Normal. Enlarged (Gravid 12-16 wk size). Not tender.      Rectum: No external hemorrhoid.     Comments: Pap collected Musculoskeletal:     Right lower leg: No  edema.     Left lower leg: No edema.  Lymphadenopathy:     Cervical: No cervical adenopathy.     Upper Body:     Right upper body: No axillary adenopathy.     Left upper body: No axillary adenopathy.  Skin:    General: Skin is warm.     Capillary Refill: Capillary refill takes less than 2 seconds.  Neurological:     Mental Status: She is alert.  Psychiatric:        Mood and Affect: Mood normal.        Behavior: Behavior normal.     Assessment and Plan:  Pregnancy: [id number] at [redacted]w[redacted]d  1. Supervision of other normal pregnancy, antepartum (Primary) Reviewed recommended weight gain: 25-35lbs. Discussed healthy diet (avoiding soda, good sources of protein, fruits, and vegetables, lots of water) and exercising. Denies n/v Pap due today and collected Last dental visit 3 months ago. Encouraged to seek dental care this pregnancy. Sent order for dating US  d/t unsure LMP w irregular periods and unable to find FHR today.  Pt lives with her partner, her kids and her partner's kids. She works full time at [employer].   EPDS: 9. Will f/u about this at [facility]  - Prenatal Profile I- [phone] - MaterniT21 PLUS Core - Hemoglobin, fingerstick - IGP, Aptima HPV - Hemoglobinopathy evaluation -[id number]  2. History of postpartum hemorrhage With first pregnancy   Discussed overview of care and coordination with inpatient delivery practices including [facility],  [facility].   Reviewed Centering pregnancy as standard of care at [facility]. Pt declines.  Preterm labor symptoms and general obstetric precautions including but not limited to vaginal bleeding, contractions, leaking of fluid and fetal movement were reviewed in detail with the patient.  Please refer to After Visit Summary for other counseling recommendations.   No follow-ups on file.  Future Appointments  Date Time Provider Department Center  07/08/2023  1:45 PM [facility]  [facility]  [facility]Out[facility]  07/30/2023  4:00  PM [facility]-MH PROVIDER AC-MAT None    Dorna Gasman, NP

## 2023-07-06 LAB — PREGNANCY, INITIAL SCREEN
Antibody Screen: NEGATIVE
Basophils Absolute: 0 10*3/uL (ref 0.0–0.2)
Basos: 0 %
Bilirubin, UA: NEGATIVE
Chlamydia trachomatis, NAA: NEGATIVE
EOS (ABSOLUTE): 0.2 10*3/uL (ref 0.0–0.4)
Eos: 2 %
Glucose, UA: NEGATIVE
HCV Ab: NONREACTIVE
HIV Screen 4th Generation wRfx: NONREACTIVE
Hematocrit: 34.8 % (ref 34.0–46.6)
Hemoglobin: 11.8 g/dL (ref 11.1–15.9)
Hepatitis B Surface Ag: NEGATIVE
Immature Grans (Abs): 0.1 10*3/uL (ref 0.0–0.1)
Immature Granulocytes: 1 %
Ketones, UA: NEGATIVE
Leukocytes,UA: NEGATIVE
Lymphocytes Absolute: 1.6 10*3/uL (ref 0.7–3.1)
Lymphs: 19 %
MCH: 30.8 pg (ref 26.6–33.0)
MCHC: 33.9 g/dL (ref 31.5–35.7)
MCV: 91 fL (ref 79–97)
Monocytes Absolute: 0.6 10*3/uL (ref 0.1–0.9)
Monocytes: 7 %
Neisseria Gonorrhoeae by PCR: NEGATIVE
Neutrophils Absolute: 5.8 10*3/uL (ref 1.4–7.0)
Neutrophils: 71 %
Nitrite, UA: NEGATIVE
Platelets: 209 10*3/uL (ref 150–450)
Protein,UA: NEGATIVE
RBC, UA: NEGATIVE
RBC: 3.83 x10E6/uL (ref 3.77–5.28)
RDW: 12.3 % (ref 11.7–15.4)
RPR Ser Ql: NONREACTIVE
Rh Factor: POSITIVE
Rubella Antibodies, IGG: 4.55 {index} (ref >0.99)
Specific Gravity, UA: 1.018 (ref 1.005–1.030)
Urobilinogen, Ur: 0.2 mg/dL (ref 0.2–1.0)
WBC: 8.2 10*3/uL (ref 3.4–10.8)
pH, UA: 7.5 (ref 5.0–7.5)

## 2023-07-06 LAB — MATERNIT 21 PLUS CORE, BLOOD
Fetal Fraction: 18
Result (T21): NEGATIVE
Trisomy 13 (Patau syndrome): NEGATIVE
Trisomy 18 (Edwards syndrome): NEGATIVE
Trisomy 21 (Down syndrome): NEGATIVE

## 2023-07-06 LAB — MICROSCOPIC EXAMINATION
Bacteria, UA: NONE SEEN
Casts: NONE SEEN /LPF
Epithelial Cells (non renal): NONE SEEN /HPF (ref 0–10)
RBC, Urine: NONE SEEN /HPF (ref 0–2)
WBC, UA: NONE SEEN /HPF (ref 0–5)

## 2023-07-06 LAB — HGB FRACTIONATION CASCADE
Hgb A2: 2.7 % (ref 1.8–3.2)
Hgb A: 97.3 % (ref 96.4–98.8)
Hgb F: 0 % (ref 0.0–2.0)
Hgb S: 0 %

## 2023-07-06 LAB — HCV INTERPRETATION

## 2023-07-06 LAB — URINE CULTURE, OB REFLEX

## 2023-07-08 ENCOUNTER — Ambulatory Visit
Admission: RE | Admit: 2023-07-08 | Discharge: 2023-07-08 | Disposition: A | Discharge status: Home / Self Care | Source: Ambulatory Visit | Attending: Nurse Practitioner | Admitting: Nurse Practitioner

## 2023-07-08 ENCOUNTER — Other Ambulatory Visit: Payer: Self-pay | Admitting: Nurse Practitioner

## 2023-07-08 DIAGNOSIS — O208 Other hemorrhage in early pregnancy: Secondary | ICD-10-CM | POA: Insufficient documentation

## 2023-07-08 DIAGNOSIS — Z3A12 12 weeks gestation of pregnancy: Secondary | ICD-10-CM | POA: Diagnosis not present

## 2023-07-08 DIAGNOSIS — Z3480 Encounter for supervision of other normal pregnancy, unspecified trimester: Secondary | ICD-10-CM | POA: Diagnosis present

## 2023-07-08 DIAGNOSIS — Z8759 Personal history of other complications of pregnancy, childbirth and the puerperium: Secondary | ICD-10-CM

## 2023-07-09 ENCOUNTER — Ambulatory Visit: Payer: Self-pay

## 2023-07-09 LAB — IGP, APTIMA HPV
HPV Aptima: NEGATIVE
PAP Smear Comment: 0

## 2023-07-11 ENCOUNTER — Ambulatory Visit: Payer: Self-pay | Admitting: Family Medicine

## 2023-07-11 ENCOUNTER — Other Ambulatory Visit: Payer: Self-pay | Admitting: Family Medicine

## 2023-07-11 DIAGNOSIS — O208 Other hemorrhage in early pregnancy: Secondary | ICD-10-CM | POA: Insufficient documentation

## 2023-07-11 DIAGNOSIS — Z3480 Encounter for supervision of other normal pregnancy, unspecified trimester: Secondary | ICD-10-CM

## 2023-07-11 HISTORY — DX: Other hemorrhage in early pregnancy: O20.8

## 2023-07-11 NOTE — Progress Notes (Signed)
 Reviewed dating US . GA [redacted]w[redacted]d by US . Smal chorionic hemorrhage seen, will follow up on anatomy US . To be discussed at next prenatal visit.   Tempie Fee, MD 07/11/23  11:40 AM

## 2023-07-11 NOTE — Progress Notes (Signed)
 Reviewed dating US . GA [redacted]w[redacted]d by US  (concurrent with GA by LMP, will keep LMP). Small chorionic hemorrhage seen, will follow up on anatomy US . To be discussed at next prenatal visit.   Will order anatomy US  at [facility] for appropriate dates (18-20 wk, 6/21 - 7/11).   Tempie Fee, MD 07/11/23  11:41 AM

## 2023-07-12 ENCOUNTER — Telehealth: Payer: Self-pay

## 2023-07-12 NOTE — Telephone Encounter (Signed)
 Call to client and left message to call regarding her anatomy US  appt. Number to call provided. Ariel Begun, RN

## 2023-07-15 NOTE — Telephone Encounter (Signed)
 Return call from client and US  appt date / arrival time provided. Counseled to drink 4 large glasses of water by 1230 and not use the bathroom. Client verbalized understanding. Verbal directions / address of [facility] provided to client. Ariel Begun, RN

## 2023-07-30 ENCOUNTER — Ambulatory Visit: Admitting: Nurse Practitioner

## 2023-07-30 ENCOUNTER — Encounter: Payer: Self-pay | Admitting: Nurse Practitioner

## 2023-07-30 VITALS — BP 106/50 | HR 76 | Temp 97.6°F | Wt 144.4 lb

## 2023-07-30 DIAGNOSIS — Z3480 Encounter for supervision of other normal pregnancy, unspecified trimester: Secondary | ICD-10-CM

## 2023-07-30 DIAGNOSIS — Z3A15 15 weeks gestation of pregnancy: Secondary | ICD-10-CM

## 2023-07-30 DIAGNOSIS — O208 Other hemorrhage in early pregnancy: Secondary | ICD-10-CM

## 2023-07-30 DIAGNOSIS — Z3482 Encounter for supervision of other normal pregnancy, second trimester: Secondary | ICD-10-CM

## 2023-07-30 NOTE — Progress Notes (Signed)

## 2023-07-30 NOTE — Progress Notes (Signed)
  [facility] [address], [address] [place] [address] Main phone: [phone]  Prenatal Visit  Subjective:  Jeanne [name] is a 33 y.o. [phone] at [redacted]w[redacted]d being seen today for ongoing prenatal care.  She is currently monitored for the following issues for this low-risk pregnancy:   Patient Active Problem List   Diagnosis Date Noted   Subchorionic hemorrhage in first trimester 07/11/2023   Supervision of other normal pregnancy, antepartum 07/02/2023   Anemia 11/12/2018   History of COVID-19 10/27/2018   Adjustment disorder 09/09/2018   Patient reports no complaints.  Contractions: Not present. Vag. Bleeding: None.  Movement: Absent. Denies leaking of fluid/ROM.   The following portions of the patient's history were reviewed and updated as appropriate: allergies, current medications, past family history, past medical history, past social history, past surgical history and problem list. Problem list updated.  Objective:   Vitals:   07/30/23 1549  BP: (!) 106/50  Pulse: 76  Temp: 97.6 F (36.4 C)  Weight: 144 lb 6.4 oz (65.5 kg)    Fetal Status: Fetal Heart Rate (bpm): 146 Fundal Height:  (2 fingers below umbilicus.) Movement: Absent     General:  Alert, oriented and cooperative. Patient is in no acute distress.  Skin: Skin is warm and dry. No rash noted.   Cardiovascular: Normal heart rate noted  Respiratory: Normal respiratory effort, no problems with respiration noted  Abdomen: Soft, gravid, appropriate for gestational age.  Pain/Pressure: Absent     Pelvic: Cervical exam deferred        Extremities: Normal range of motion.  Edema: None  Mental Status: Normal mood and affect. Normal behavior. Normal judgment and thought content.   Assessment and Plan:  Pregnancy: G5P3013 at [redacted]w[redacted]d  1. [redacted] weeks gestation of pregnancy (Primary)  2. Supervision of other normal pregnancy, antepartum Reports no  concerns today. Reports taking PNV daily. Followed up on on EPDS of 9 at last visit and patient reports she is doing well mentally.  Reviewed initial OB labs obtained at last visit.   Anatomy US  scheduled for 06/23 at [facility]. Patient aware.   - AFP, Serum, Open Spina Bifida  3. Subchorionic hemorrhage in first trimester Reviewed dating US  and subchorionic hemorrhage found. Answered patient's questions and described next steps including re-evaluation on upcoming anatomy US . Patient verbalized understanding and agreed with plan.   Preterm labor symptoms and general obstetric precautions including but not limited to vaginal bleeding, contractions, leaking of fluid and fetal movement were reviewed in detail with the patient.  Please refer to After Visit Summary for other counseling recommendations.   Return in about 4 weeks (around 08/27/2023) for 19 week Prenatal visit.  Future Appointments  Date Time Provider Department Center  08/19/2023  1:00 PM [facility]  3 [facility]  [facility]  08/27/2023  3:50 PM [facility]-MH PROVIDER AC-MAT None    Merleen Stare, NP

## 2023-07-30 NOTE — Progress Notes (Signed)
 Patient here for MH RV at 15 3/7. Desires AFP today. Aware of 08/19/23 U/S. Aaron AasRosiland Cooks, RN

## 2023-08-01 ENCOUNTER — Ambulatory Visit: Payer: Self-pay | Admitting: Family Medicine

## 2023-08-01 DIAGNOSIS — Z3480 Encounter for supervision of other normal pregnancy, unspecified trimester: Secondary | ICD-10-CM

## 2023-08-01 LAB — AFP, SERUM, OPEN SPINA BIFIDA
AFP MoM: 0.64
AFP Value: 19.8 ng/mL
Gest. Age on Collection Date: 15.3 wk
Maternal Age At EDD: 31 a
OSBR Risk 1 IN: 10000
Test Results:: NEGATIVE
Weight: 144 [lb_av]

## 2023-08-01 NOTE — Addendum Note (Signed)
 Addended by: Jaylynne Birkhead on: 08/01/2023 12:51 PM   Modules accepted: Orders

## 2023-08-19 ENCOUNTER — Ambulatory Visit
Admission: RE | Admit: 2023-08-19 | Discharge: 2023-08-19 | Disposition: A | Discharge status: Home / Self Care | Source: Ambulatory Visit | Attending: Family Medicine | Admitting: Family Medicine

## 2023-08-19 DIAGNOSIS — Z3689 Encounter for other specified antenatal screening: Secondary | ICD-10-CM | POA: Insufficient documentation

## 2023-08-19 DIAGNOSIS — Z3A18 18 weeks gestation of pregnancy: Secondary | ICD-10-CM | POA: Insufficient documentation

## 2023-08-19 DIAGNOSIS — Z3480 Encounter for supervision of other normal pregnancy, unspecified trimester: Secondary | ICD-10-CM | POA: Insufficient documentation

## 2023-08-27 ENCOUNTER — Ambulatory Visit: Admitting: Nurse Practitioner

## 2023-08-27 ENCOUNTER — Encounter: Payer: Self-pay | Admitting: Nurse Practitioner

## 2023-08-27 ENCOUNTER — Ambulatory Visit

## 2023-08-27 VITALS — BP 97/50 | HR 72 | Temp 97.1°F | Wt 147.8 lb

## 2023-08-27 DIAGNOSIS — Z3482 Encounter for supervision of other normal pregnancy, second trimester: Secondary | ICD-10-CM

## 2023-08-27 DIAGNOSIS — Z3A19 19 weeks gestation of pregnancy: Secondary | ICD-10-CM

## 2023-08-27 DIAGNOSIS — Z3480 Encounter for supervision of other normal pregnancy, unspecified trimester: Secondary | ICD-10-CM

## 2023-08-27 DIAGNOSIS — D649 Anemia, unspecified: Secondary | ICD-10-CM

## 2023-08-27 DIAGNOSIS — O208 Other hemorrhage in early pregnancy: Secondary | ICD-10-CM

## 2023-08-27 MED ORDER — PRENATAL 27-0.8 MG PO TABS: 1.0000 | ORAL_TABLET | Freq: Every day | ORAL | 0 refills | Status: AC

## 2023-08-27 NOTE — Progress Notes (Signed)
  [facility] [address] [place] [address] Main phone: [phone]  Prenatal Visit  Subjective:  Jeanne [name] is a 33 y.o. [phone] at [redacted]w[redacted]d being seen today for ongoing prenatal care.  She is currently monitored for the following issues for this low-risk pregnancy:   Patient Active Problem List   Diagnosis Date Noted   Subchorionic hemorrhage in first trimester 07/11/2023   Supervision of other normal pregnancy, antepartum 07/02/2023   Anemia 11/12/2018   History of COVID-19 10/27/2018   Adjustment disorder 09/09/2018   Patient reports no complaints.  Contractions: Not present. Vag. Bleeding: None.  Movement: Absent (Has not started feeling baby move yet.). Denies leaking of fluid/ROM.   The following portions of the patient's history were reviewed and updated as appropriate: allergies, current medications, past family history, past medical history, past social history, past surgical history and problem list. Problem list updated.  Objective:   Vitals:   08/27/23 1608  BP: (!) 97/50  Pulse: 72  Temp: (!) 97.1 F (36.2 C)  Weight: 147 lb 12.8 oz (67 kg)    Fetal Status: Fetal Heart Rate (bpm): 136 Fundal Height: 19 cm Movement: Absent (Has not started feeling baby move yet.)     General:  Alert, oriented and cooperative. Patient is in no acute distress.  Skin: Skin is warm and dry. No rash noted.   Cardiovascular: Normal heart rate noted  Respiratory: Normal respiratory effort, no problems with respiration noted  Abdomen: Soft, gravid, appropriate for gestational age.  Pain/Pressure: Absent     Pelvic: Cervical exam deferred        Extremities: Normal range of motion.  Edema: None  Mental Status: Normal mood and affect. Normal behavior. Normal judgment and thought content.   Assessment and Plan:  Pregnancy: G5P3013 at [redacted]w[redacted]d  1. [redacted] weeks gestation of pregnancy (Primary) RV in 4  weeks.   2. Supervision of other normal pregnancy, antepartum ~Patient reports taking PNV daily. States she will run out before next prenatal appt. Refill given today.  ~Patient anemia resolved and is no longer taking iron EOD. Last hgb 11.8 on 07/02/23. ~Patient reports no concerns today. States nausea has resolved.  ~Reports no mental health concerns today.  ~BP=97/50, reassuring.   Total Weight Gain: 7 lb 12.8 oz (3.538 kg) 3 lb gain in previous month  Pre-gravid BMI 24.02 normal. Expected weight gain this pregnancy 25-35 pounds.  08/19/23: Patient had anatomy US  at [redacted]w[redacted]d. Some anatomy not visualized including kidneys and spine, what was visualized considered normal. AFV=WNL, EFW=48.5%ile. Anterior placenta with placental lakes noted. 3VC.  Patient noted to have subchorionic hemorrhage on dating US  07/08/23.  Recommend repeat US  to hopefully visualize more anatomy. Ordered to be completed in 1 month.   - Prenatal Vit-Fe Fumarate-FA (MULTIVITAMIN-PRENATAL) 27-0.8 MG TABS tablet; Take 1 tablet by mouth daily at 12 noon.  Dispense: 100 tablet; Refill: 0 - US  OB Comp + 14 Wk; Future  Preterm labor symptoms and general obstetric precautions including but not limited to vaginal bleeding, contractions, leaking of fluid and fetal movement were reviewed in detail with the patient. Please refer to After Visit Summary for other counseling recommendations.   Return in about 4 weeks (around 09/24/2023) for routine prenatal care.  Future Appointments  Date Time Provider Department Center  09/24/2023  4:00 PM [facility] PROVIDER AC-MAT None    Clarita LITTIE Narrow, NP

## 2023-08-27 NOTE — Progress Notes (Addendum)
 Patient here for MH RV at 19 3/7. Covid box updated, and arm measured.. Patient sent FMLA forms to patient intake email with note to give to maternity. Authorization to release forms signed so forms can be faxed to [employer] when completed.SABRAPNV given per request of provider.SABRASABRASABRAIzetta Parish, RN

## 2023-08-28 ENCOUNTER — Telehealth: Payer: Self-pay

## 2023-08-28 NOTE — Telephone Encounter (Signed)
 Call to client and notified of 09/18/23 US  appt at [facility]. Counseled to arrive at 1545 and to drink 4 glasses of water by 1530 and not empty bladder. Understanding verbalized. [facility] address provided to client. Burnadette Lowers, RN

## 2023-08-29 ENCOUNTER — Other Ambulatory Visit: Payer: Self-pay | Admitting: Family Medicine

## 2023-08-29 ENCOUNTER — Ambulatory Visit: Payer: Self-pay | Admitting: Family Medicine

## 2023-08-29 DIAGNOSIS — Z3480 Encounter for supervision of other normal pregnancy, unspecified trimester: Secondary | ICD-10-CM

## 2023-08-29 NOTE — Progress Notes (Signed)
 Anatomy US . Limited views, but visualized anatomy normal. [redacted]w[redacted]d. EFW 48%. The transverse view of the spine, lateral ventricles, upper lip, outflow tracks, and kidneys were not well visualized. Will order follow up US .  Dorothyann Helling, MD 08/29/23  2:44 PM

## 2023-08-29 NOTE — Progress Notes (Signed)
 Anatomy US . Limited views, but visualized anatomy normal. [redacted]w[redacted]d. EFW 48%. The transverse view of the spine, lateral ventricles, upper lip, outflow tracks, and kidneys were not well visualized. Will order follow up US .  Dorothyann Helling, MD 08/29/23  2:45 PM

## 2023-09-05 ENCOUNTER — Other Ambulatory Visit: Payer: Self-pay | Admitting: Family Medicine

## 2023-09-05 NOTE — Progress Notes (Signed)
 Received FMLA paperwork for Ms. Jeanne [name] via fax 7/09. Papers have been completed and given to RN for faxing.   Dorothyann Helling, MD 09/05/23  11:21 AM

## 2023-09-06 NOTE — Progress Notes (Signed)
 FMLA paperwork faxed to [facility] with confirmation. See Consent for faxing this information in scanned media documents. Original on nurse-to-do cart for patient. Copy sent for scanning.Hulan Parish, RN

## 2023-09-18 ENCOUNTER — Ambulatory Visit
Admission: RE | Admit: 2023-09-18 | Discharge: 2023-09-18 | Disposition: A | Discharge status: Home / Self Care | Source: Ambulatory Visit | Attending: Nurse Practitioner | Admitting: Nurse Practitioner

## 2023-09-18 DIAGNOSIS — Z3689 Encounter for other specified antenatal screening: Secondary | ICD-10-CM | POA: Insufficient documentation

## 2023-09-18 DIAGNOSIS — Z3480 Encounter for supervision of other normal pregnancy, unspecified trimester: Secondary | ICD-10-CM | POA: Insufficient documentation

## 2023-09-18 DIAGNOSIS — O321XX0 Maternal care for breech presentation, not applicable or unspecified: Secondary | ICD-10-CM | POA: Diagnosis not present

## 2023-09-18 DIAGNOSIS — Z3A22 22 weeks gestation of pregnancy: Secondary | ICD-10-CM | POA: Insufficient documentation

## 2023-09-22 ENCOUNTER — Other Ambulatory Visit: Payer: Self-pay | Admitting: Family Medicine

## 2023-09-22 ENCOUNTER — Ambulatory Visit: Payer: Self-pay | Admitting: Family Medicine

## 2023-09-22 DIAGNOSIS — Z3480 Encounter for supervision of other normal pregnancy, unspecified trimester: Secondary | ICD-10-CM

## 2023-09-22 NOTE — Progress Notes (Signed)
 Reviewed f/u anatomy US . Normal anatomy, this completes anatomy study. Fundal/anterior placenta, GA [redacted]w[redacted]d.     Dorothyann Helling, MD 09/22/23  8:23 PM

## 2023-09-22 NOTE — Progress Notes (Signed)
 Reviewed f/u anatomy US . Normal anatomy, this completes anatomy study. Fundal/anterior placenta, GA [redacted]w[redacted]d.   Dorothyann Helling, MD 09/22/23  8:22 PM

## 2023-09-24 ENCOUNTER — Ambulatory Visit: Admitting: Physician Assistant

## 2023-09-24 ENCOUNTER — Encounter: Payer: Self-pay | Admitting: Physician Assistant

## 2023-09-24 VITALS — BP 89/52 | HR 76 | Temp 97.2°F | Wt 151.8 lb

## 2023-09-24 DIAGNOSIS — O208 Other hemorrhage in early pregnancy: Secondary | ICD-10-CM

## 2023-09-24 DIAGNOSIS — Z3A23 23 weeks gestation of pregnancy: Secondary | ICD-10-CM

## 2023-09-24 DIAGNOSIS — Z3480 Encounter for supervision of other normal pregnancy, unspecified trimester: Secondary | ICD-10-CM

## 2023-09-24 DIAGNOSIS — Z3482 Encounter for supervision of other normal pregnancy, second trimester: Secondary | ICD-10-CM

## 2023-09-24 NOTE — Progress Notes (Signed)
  [facility] [address] [place] [address] Main phone: [phone]  Prenatal Visit  Subjective:  Jeanne [name] is a 33 y.o. [phone] at [redacted]w[redacted]d being seen today for ongoing prenatal care.  She is currently monitored for the following issues for this low-risk pregnancy:   Patient Active Problem List   Diagnosis Date Noted   Subchorionic hemorrhage in first trimester 07/11/2023   Supervision of other normal pregnancy, antepartum 07/02/2023   History of COVID-19 10/27/2018   Adjustment disorder 09/09/2018   Patient reports no complaints.  Contractions: Not present. Vag. Bleeding: None.  Movement: Present. Denies leaking of fluid/ROM.   The following portions of the patient's history were reviewed and updated as appropriate: allergies, current medications, past family history, past medical history, past social history, past surgical history and problem list. Problem list updated.  Objective:   Vitals:   09/24/23 1543  BP: (!) 89/52  Pulse: 76  Temp: (!) 97.2 F (36.2 C)  Weight: 151 lb 12.8 oz (68.9 kg)    Fetal Status: Fetal Heart Rate (bpm): 146 Fundal Height: 23 cm Movement: Present     General:  Alert, oriented and cooperative. Patient is in no acute distress.  Skin: Skin is warm and dry. No rash noted.   Cardiovascular: Normal heart rate noted  Respiratory: Normal respiratory effort, no problems with respiration noted  Abdomen: Soft, gravid, appropriate for gestational age.  Pain/Pressure: Absent     Pelvic: Cervical exam deferred        Extremities: Normal range of motion.  Edema: None  Mental Status: Normal mood and affect. Normal behavior. Normal judgment and thought content.   Assessment and Plan:  Pregnancy: G5P3013 at [redacted]w[redacted]d  1. [redacted] weeks gestation of pregnancy (Primary) RV in 4 weeks. Anticipatory guidance re: routine 28-week testing at that visit, including 1h GTT.  2.  Supervision of other normal pregnancy, antepartum Enc pt to drink plenty of water in light of very hot weather. Doing well, no concerns. Reviewed 09/18/23 US  to complete fetal anatomy: WNL.    Preterm labor symptoms and general obstetric precautions including but not limited to vaginal bleeding, contractions, leaking of fluid and fetal movement were reviewed in detail with the patient. Please refer to After Visit Summary for other counseling recommendations.  Return in about 4 weeks (around 10/22/2023) for Routine prenatal care, 28 wk labs (before 10:30am or 3pm).  Future Appointments  Date Time Provider Department Center  10/22/2023  4:00 PM [facility] PROVIDER AC-MAT None    Iva Montelongo, PA-C

## 2023-09-24 NOTE — Progress Notes (Signed)
 Patient states she just left work and it is hot at her work location. Reminded patient to keep hydrated and 8 ounces of water given for  patient to drink in clinic. FMLA paperwork returned to patient/completed. Consuelo Kingsley RN

## 2023-10-22 ENCOUNTER — Ambulatory Visit: Admitting: Physician Assistant

## 2023-10-22 ENCOUNTER — Encounter: Payer: Self-pay | Admitting: Physician Assistant

## 2023-10-22 VITALS — BP 95/54 | HR 78 | Temp 97.9°F | Wt 154.6 lb

## 2023-10-22 DIAGNOSIS — Z3A27 27 weeks gestation of pregnancy: Secondary | ICD-10-CM

## 2023-10-22 DIAGNOSIS — Z23 Encounter for immunization: Secondary | ICD-10-CM | POA: Diagnosis not present

## 2023-10-22 DIAGNOSIS — Z3480 Encounter for supervision of other normal pregnancy, unspecified trimester: Secondary | ICD-10-CM

## 2023-10-22 NOTE — Progress Notes (Addendum)
 [facility] Forms completed. Unable to perform 1 HR GTT prior to Lab closure therefore scheduled 28 week labs for Thursday. Patient states she will think about receiving the Fly vaccine during pregnancy but currently undecided. Consuelo Kingsley RN

## 2023-10-22 NOTE — Progress Notes (Signed)
  [facility] [address], [address] [place] [address] Main phone: [phone]  Prenatal Visit  Subjective:  Jeanne [name] is a 33 y.o. [phone] at [redacted]w[redacted]d being seen today for ongoing prenatal care.  She is currently monitored for the following issues for this low-risk pregnancy:   Patient Active Problem List   Diagnosis Date Noted   Subchorionic hemorrhage in first trimester 07/11/2023   Supervision of other normal pregnancy, antepartum 07/02/2023   History of COVID-19 10/27/2018   Adjustment disorder 09/09/2018   Patient reports no complaints.  Contractions: Not present. Vag. Bleeding: None.  Movement: Present. Denies leaking of fluid/ROM.   The following portions of the patient's history were reviewed and updated as appropriate: allergies, current medications, past family history, past medical history, past social history, past surgical history and problem list. Problem list updated.  Objective:   Vitals:   10/22/23 1618  BP: (!) 95/54  Pulse: 78  Temp: 97.9 F (36.6 C)  Weight: 154 lb 9.6 oz (70.1 kg)    Fetal Status: Fetal Heart Rate (bpm): 144 Fundal Height: 29 cm Movement: Present     General:  Alert, oriented and cooperative. Patient is in no acute distress.  Skin: Skin is warm and dry. No rash noted.   Cardiovascular: Normal heart rate noted  Respiratory: Normal respiratory effort, no problems with respiration noted  Abdomen: Soft, gravid, appropriate for gestational age.  Pain/Pressure: Absent     Pelvic: Cervical exam deferred        Extremities: Normal range of motion.  Edema: None  Mental Status: Normal mood and affect. Normal behavior. Normal judgment and thought content.   Assessment and Plan:  Pregnancy: G5P3013 at [redacted]w[redacted]d  1. [redacted] weeks gestation of pregnancy (Primary) RV in 1 week for routine 28-week labs and in 2 weeks for next prenatal visit. - Tdap vaccine greater than or equal  to 7yo IM  2. Supervision of other normal pregnancy, antepartum Continue prenatal vitamins. TWG 14 lbs, appropriate. Enc exercise and healthy meals. EPDS score = 7 today, neg/reassuring in this patient with remote h/o adjustment disorder. Has infant carseat but not crib. Enc to obtain crib.   Preterm labor symptoms and general obstetric precautions including but not limited to vaginal bleeding, contractions, leaking of fluid and fetal movement were reviewed in detail with the patient. Please refer to After Visit Summary for other counseling recommendations.  Return in about 2 weeks (around 11/05/2023) for Routine prenatal care.  Future Appointments  Date Time Provider Department Center  10/24/2023  3:20 PM [facility] NURSE [facility] None  11/05/2023  4:00 PM [facility] PROVIDER [facility] None    Saul Irvine, PA-C

## 2023-10-24 ENCOUNTER — Other Ambulatory Visit

## 2023-10-24 DIAGNOSIS — Z3480 Encounter for supervision of other normal pregnancy, unspecified trimester: Secondary | ICD-10-CM

## 2023-10-24 DIAGNOSIS — O99013 Anemia complicating pregnancy, third trimester: Secondary | ICD-10-CM

## 2023-10-24 DIAGNOSIS — O99019 Anemia complicating pregnancy, unspecified trimester: Secondary | ICD-10-CM

## 2023-10-24 DIAGNOSIS — Z3483 Encounter for supervision of other normal pregnancy, third trimester: Secondary | ICD-10-CM

## 2023-10-24 LAB — HEMOGLOBIN, FINGERSTICK: Hemoglobin: 10.4 g/dL — ABNORMAL LOW (ref 11.1–15.9)

## 2023-10-24 MED ORDER — FERROUS SULFATE 325 (65 FE) MG PO TBEC: 325.0000 mg | DELAYED_RELEASE_TABLET | Freq: Two times a day (BID) | ORAL | Status: DC

## 2023-10-24 NOTE — Progress Notes (Signed)
 In nurse clinic for 28 week labs. Voices no concerns. 28 weeks drawn and tolerated well. Hgb 10.4; patient provided with Anemia pamphlet. The patient was dispensed #1 box of Iron tablets today per S.O. by JAYSON Helling, MD. I provided counseling today regarding the medication. We discussed the medication, the side effects and when to call clinic. Patient given the opportunity to ask questions. Questions answered.   Jeanne CINDERELLA Shuck, RN

## 2023-10-25 ENCOUNTER — Ambulatory Visit: Payer: Self-pay | Admitting: Family Medicine

## 2023-10-25 DIAGNOSIS — O99019 Anemia complicating pregnancy, unspecified trimester: Secondary | ICD-10-CM | POA: Insufficient documentation

## 2023-10-25 LAB — FE+CBC/D/PLT+TIBC+FER+RETIC
Basophils Absolute: 0 x10E3/uL (ref 0.0–0.2)
Basos: 0 %
EOS (ABSOLUTE): 0.2 x10E3/uL (ref 0.0–0.4)
Eos: 2 %
Ferritin: 14 ng/mL — ABNORMAL LOW (ref 15–150)
Hematocrit: 31.9 % — ABNORMAL LOW (ref 34.0–46.6)
Hemoglobin: 10.5 g/dL — ABNORMAL LOW (ref 11.1–15.9)
Immature Grans (Abs): 0.2 x10E3/uL — ABNORMAL HIGH (ref 0.0–0.1)
Immature Granulocytes: 3 %
Iron Saturation: 7 % — CL (ref 15–55)
Iron: 40 ug/dL (ref 27–159)
Lymphocytes Absolute: 1.3 x10E3/uL (ref 0.7–3.1)
Lymphs: 17 %
MCH: 30.3 pg (ref 26.6–33.0)
MCHC: 32.9 g/dL (ref 31.5–35.7)
MCV: 92 fL (ref 79–97)
Monocytes Absolute: 0.5 x10E3/uL (ref 0.1–0.9)
Monocytes: 6 %
Neutrophils Absolute: 5.6 x10E3/uL (ref 1.4–7.0)
Neutrophils: 72 %
Platelets: 191 x10E3/uL (ref 150–450)
RBC: 3.46 x10E6/uL — ABNORMAL LOW (ref 3.77–5.28)
RDW: 12.6 % (ref 11.7–15.4)
Retic Ct Pct: 2.8 % — ABNORMAL HIGH (ref 0.6–2.6)
Total Iron Binding Capacity: 609 ug/dL (ref 250–450)
UIBC: 569 ug/dL — ABNORMAL HIGH (ref 131–425)
WBC: 7.8 x10E3/uL (ref 3.4–10.8)

## 2023-10-25 MED ORDER — FERROUS SULFATE 325 (65 FE) MG PO TBEC: 325.0000 mg | DELAYED_RELEASE_TABLET | ORAL | Status: DC

## 2023-10-25 MED ORDER — FERROUS SULFATE 325 (65 FE) MG PO TBEC: 325.0000 mg | DELAYED_RELEASE_TABLET | ORAL | 3 refills | Status: AC

## 2023-10-25 NOTE — Progress Notes (Signed)
 Labs consistent with iron deficiency anemia. Added to problem list. PO iron every other day sent to pharmacy on file.   Dorothyann Helling, MD 10/25/23  11:57 AM

## 2023-10-26 LAB — HIV-1/HIV-2 QUALITATIVE RNA
HIV-1 RNA, Qualitative: NONREACTIVE
HIV-2 RNA, Qualitative: NONREACTIVE

## 2023-10-26 LAB — GLUCOSE, 1 HOUR GESTATIONAL: Gestational Diabetes Screen: 106 mg/dL (ref 70–139)

## 2023-10-26 LAB — RPR: RPR Ser Ql: NONREACTIVE

## 2023-11-05 ENCOUNTER — Ambulatory Visit: Admitting: Physician Assistant

## 2023-11-05 VITALS — BP 91/47 | HR 79 | Temp 97.1°F | Wt 158.0 lb

## 2023-11-05 DIAGNOSIS — O99013 Anemia complicating pregnancy, third trimester: Secondary | ICD-10-CM

## 2023-11-05 DIAGNOSIS — Z3A29 29 weeks gestation of pregnancy: Secondary | ICD-10-CM

## 2023-11-05 DIAGNOSIS — O99019 Anemia complicating pregnancy, unspecified trimester: Secondary | ICD-10-CM

## 2023-11-05 DIAGNOSIS — D509 Iron deficiency anemia, unspecified: Secondary | ICD-10-CM

## 2023-11-05 DIAGNOSIS — Z3483 Encounter for supervision of other normal pregnancy, third trimester: Secondary | ICD-10-CM

## 2023-11-05 DIAGNOSIS — Z3480 Encounter for supervision of other normal pregnancy, unspecified trimester: Secondary | ICD-10-CM

## 2023-11-05 NOTE — Progress Notes (Signed)
  [facility] [address] [place] [address] Main phone: [phone]  Prenatal Visit  Subjective:  Jeanne [name] is a 33 y.o. [phone] at [redacted]w[redacted]d being seen today for ongoing prenatal care.  She is currently monitored for the following issues for this low-risk pregnancy:   Patient Active Problem List   Diagnosis Date Noted   Iron deficiency anemia of pregnancy 10/25/2023   Subchorionic hemorrhage in first trimester 07/11/2023   Supervision of other normal pregnancy, antepartum 07/02/2023   History of COVID-19 10/27/2018   Adjustment disorder 09/09/2018   Patient reports no complaints.  Contractions: Not present. Vag. Bleeding: None.  Movement: Present. Denies leaking of fluid/ROM.   The following portions of the patient's history were reviewed and updated as appropriate: allergies, current medications, past family history, past medical history, past social history, past surgical history and problem list. Problem list updated.  Objective:   Vitals:   11/05/23 1608  BP: (!) 91/47  Pulse: 79  Temp: (!) 97.1 F (36.2 C)  Weight: 158 lb (71.7 kg)    Fetal Status: Fetal Heart Rate (bpm): 140 Fundal Height: 30 cm Movement: Present     General:  Alert, oriented and cooperative. Patient is in no acute distress.  Skin: Skin is warm and dry. No rash noted.   Cardiovascular: Normal heart rate noted  Respiratory: Normal respiratory effort, no problems with respiration noted  Abdomen: Soft, gravid, appropriate for gestational age.  Pain/Pressure: Absent     Pelvic: Cervical exam deferred        Extremities: Normal range of motion.  Edema: None  Mental Status: Normal mood and affect. Normal behavior. Normal judgment and thought content.   Assessment and Plan:  Pregnancy: G5P3013 at [redacted]w[redacted]d  1. [redacted] weeks gestation of pregnancy (Primary) RV in 2 weeks.  2. Supervision of other normal pregnancy,  antepartum Reviewed lab results from prior visit with patient. Continue prenatal vitamins.  3. Iron deficiency anemia of pregnancy Continue oral iron supplement and iron-rich foods.   Preterm labor symptoms and general obstetric precautions including but not limited to vaginal bleeding, contractions, leaking of fluid and fetal movement were reviewed in detail with the patient. Please refer to After Visit Summary for other counseling recommendations.  Return in about 2 weeks (around 11/19/2023) for Routine prenatal care.  Future Appointments  Date Time Provider Department Center  11/22/2023  4:00 PM [facility] PROVIDER [facility] None    Oliwia Berzins, PA-C

## 2023-11-05 NOTE — Progress Notes (Signed)
 [id number]

## 2023-11-18 NOTE — Addendum Note (Signed)
 Addended by: Jamal Haskin on: 11/18/2023 12:13 PM   Modules accepted: Orders

## 2023-11-22 ENCOUNTER — Ambulatory Visit: Admitting: Family Medicine

## 2023-11-22 VITALS — BP 109/55 | HR 76 | Temp 97.9°F | Wt 159.8 lb

## 2023-11-22 DIAGNOSIS — Z3480 Encounter for supervision of other normal pregnancy, unspecified trimester: Secondary | ICD-10-CM

## 2023-11-22 DIAGNOSIS — D509 Iron deficiency anemia, unspecified: Secondary | ICD-10-CM

## 2023-11-22 DIAGNOSIS — O99013 Anemia complicating pregnancy, third trimester: Secondary | ICD-10-CM

## 2023-11-22 DIAGNOSIS — Z3A31 31 weeks gestation of pregnancy: Secondary | ICD-10-CM

## 2023-11-22 DIAGNOSIS — Z3483 Encounter for supervision of other normal pregnancy, third trimester: Secondary | ICD-10-CM

## 2023-11-22 LAB — HEMOGLOBIN, FINGERSTICK: Hemoglobin: 11 g/dL — ABNORMAL LOW (ref 11.1–15.9)

## 2023-11-22 NOTE — Progress Notes (Addendum)
 Correctly verbalizes how to take iron tablet and prenatal vitamin. Counseled to take iron tablet with juice when possible. Burnadette Lowers, RN Client inadvertently discharged from clinic prior to scheduling next [facility] RV appt and signing BTL consent. Call made to client and RV appt scheduled for 12/06/23. Per client, will come to [facility] Monday pm to sign BTL consent. Hgb today = 11.0 and to continue iron and prenatal vitamin as taking. Burnadette Lowers, RN

## 2023-11-22 NOTE — Assessment & Plan Note (Signed)
 HgB collected at visit today 11.0. Counseled pt on continuing to take ferrous sulfate  supplement until delivery. Discussed updated HgB lab with pt today and that it is WNL, pt expressed understanding and will continue to take ferrous sulfate  as advised.

## 2023-11-22 NOTE — Progress Notes (Signed)
  [facility] [address] [place] [address] Main phone: [phone]  Prenatal Visit  Subjective:  Jeanne [name] is a 33 y.o. [phone] at [redacted]w[redacted]d being seen today for ongoing prenatal care.  She is currently monitored for the following issues for this low-risk pregnancy:   Patient Active Problem List   Diagnosis Date Noted   Iron deficiency anemia of pregnancy 10/25/2023   Subchorionic hemorrhage in first trimester 07/11/2023   Supervision of other normal pregnancy, antepartum 07/02/2023   History of COVID-19 10/27/2018   Adjustment disorder 09/09/2018   Patient reports no complaints. Pt denies leaking of fluid/ROM.   The following portions of the patient's history were reviewed and updated as appropriate: allergies, current medications, past family history, past medical history, past social history, past surgical history and problem list. Problem list updated.  Objective:   Vitals:   11/22/23 1625  BP: (!) 109/55  Pulse: 76  Temp: 97.9 F (36.6 C)  Weight: 159 lb 12.8 oz (72.5 kg)    Fetal Status: Fetal Heart Rate (bpm): 138 Fundal Height: 32 cm Movement: Present     General:  Alert, oriented and cooperative. Patient is in no acute distress.  Skin: Skin is warm and dry. No rash noted.   Respiratory: Normal respiratory effort, no problems with respiration noted  Abdomen: Soft, gravid, appropriate for gestational age.  Pain/Pressure: Absent     Pelvic: Cervical exam deferred        Extremities: Normal range of motion.     Mental Status: Normal mood and affect. Normal behavior. Normal judgment and thought content.   Assessment and Plan:  Pregnancy: [id number] at [redacted]w[redacted]d  [redacted] weeks gestation of pregnancy  Supervision of other normal pregnancy, antepartum Assessment & Plan: Pt doing well during visit today expresses no concerns.  She states she has intermittent braxton hicks only when she  walks that resolves at rest. Pt is staying adequately hydrates drinking 3-4 bottles of water at work and 2 at home. Pt reports drink small amount of juice on an occasional basis. Discussed continuous hydration during pregnancy with patient, pt expressed understanding. BP WNL 109/55 today. TWG 19lbs. PTL precautions given today at visit. RV 2 weeks.  Pt continues to take PNV and Iron supplementation.    Iron deficiency anemia of pregnancy Assessment & Plan: HgB collected at visit today 11.0. Counseled pt on continuing to take ferrous sulfate  supplement until delivery. Discussed updated HgB lab with pt today and that it is WNL, pt expressed understanding and will continue to take ferrous sulfate  as advised.   Orders: -     Hemoglobin, fingerstick   Preterm labor symptoms and general obstetric precautions including but not limited to vaginal bleeding, contractions, leaking of fluid and fetal movement were reviewed in detail with the patient. Please refer to After Visit Summary for other counseling recommendations.  Return in about 2 weeks (around 12/06/2023) for Routine PNV.  Future Appointments  Date Time Provider Department Center  12/06/2023  4:00 PM [facility] PROVIDER [facility] None    Betsey CHRISTELLA Helling, MD

## 2023-11-22 NOTE — Assessment & Plan Note (Addendum)
 Pt doing well during visit today expresses no concerns.  She states she has intermittent braxton hicks only when she walks that resolves at rest. Pt is staying adequately hydrates drinking 3-4 bottles of water at work and 2 at home. Pt reports drink small amount of juice on an occasional basis. Discussed continuous hydration during pregnancy with patient, pt expressed understanding. BP WNL 109/55 today. TWG 19lbs. PTL precautions given today at visit. RV 2 weeks.  Pt continues to take PNV and Iron supplementation.

## 2023-11-27 ENCOUNTER — Ambulatory Visit: Payer: Self-pay

## 2023-12-06 ENCOUNTER — Encounter: Payer: Self-pay | Admitting: Family Medicine

## 2023-12-06 ENCOUNTER — Ambulatory Visit: Admitting: Family Medicine

## 2023-12-06 VITALS — BP 97/51 | HR 73 | Temp 97.7°F | Wt 161.8 lb

## 2023-12-06 DIAGNOSIS — D509 Iron deficiency anemia, unspecified: Secondary | ICD-10-CM

## 2023-12-06 DIAGNOSIS — Z23 Encounter for immunization: Secondary | ICD-10-CM | POA: Diagnosis not present

## 2023-12-06 DIAGNOSIS — Z3483 Encounter for supervision of other normal pregnancy, third trimester: Secondary | ICD-10-CM

## 2023-12-06 DIAGNOSIS — Z3480 Encounter for supervision of other normal pregnancy, unspecified trimester: Secondary | ICD-10-CM

## 2023-12-06 DIAGNOSIS — B372 Candidiasis of skin and nail: Secondary | ICD-10-CM

## 2023-12-06 DIAGNOSIS — O99013 Anemia complicating pregnancy, third trimester: Secondary | ICD-10-CM

## 2023-12-06 MED ORDER — NYSTATIN 100000 UNIT/GM EX CREA: 1.0000 | TOPICAL_CREAM | Freq: Two times a day (BID) | CUTANEOUS | 0 refills | Status: AC

## 2023-12-06 NOTE — Progress Notes (Signed)
  [facility] [address] [place] [address] Main phone: [phone]  Prenatal Visit  Subjective:  Jeanne [name] is a 33 y.o. [phone] at [redacted]w[redacted]d being seen today for ongoing prenatal care.  She is currently monitored for the following issues for this low-risk pregnancy:   Patient Active Problem List   Diagnosis Date Noted   Iron deficiency anemia of pregnancy 10/25/2023   Supervision of other normal pregnancy, antepartum 07/02/2023   Adjustment disorder 09/09/2018   HPI Patient  doing good so far in her pregnancy. Pt reports itching on her lower abdomen that has been occurring this week. Pt reports no changes in soaps or detergents and she has tried Vaseline which helps to alleviate the discomfort sometimes.Pt Denies leaking of fluid/ROM. No other concerns during visit today.   The following portions of the patient's history were reviewed and updated as appropriate: allergies, current medications, past family history, past medical history, past social history, past surgical history and problem list. Problem list updated.  Objective:   Vitals:   12/06/23 1557  BP: (!) 97/51  Pulse: 73  Temp: 97.7 F (36.5 C)  Weight: 161 lb 12.8 oz (73.4 kg)   Fetal Status: Fetal Heart Rate (bpm): 142 Fundal Height: 34 cm Movement: Present     General:  Alert, oriented and cooperative. Patient is in no acute distress.  Skin: Skin is warm and dry. No rash noted.   Cardiovascular: Normal heart rate noted  Respiratory: Normal respiratory effort, no problems with respiration noted  Abdomen: Soft, gravid, appropriate for gestational age.  Pain/Pressure: Present (Pt reports only in the monrings relieved with rest)     Pelvic: Cervical exam deferred , lower bilateral abdominal rash       Extremities: Normal range of motion.     Mental Status: Normal mood and affect. Normal behavior. Normal judgment and thought  content.   Assessment and Plan:  Pregnancy: [id number] at [redacted]w[redacted]d  1. Supervision of other normal pregnancy, antepartum (Primary) TWG: 21 lb 12.8 oz (9.888 kg), 2 lb in 2 weeks. BP: 97/51 WNL. Continues to take PNV everyday. RV in 2 weeks.   - Flu vaccine trivalent PF, 6mos and older(Flulaval,Afluria,Fluarix,Fluzone)  2. Candidal intertrigo Rx sent to pharmacy, advised pt on application of nystatin cream. Will continue to monitor.   - nystatin cream (MYCOSTATIN); Apply 1 Application topically 2 (two) times daily.  Dispense: 30 g; Refill: 0   3. Iron deficiency anemia of pregnancy Continues to take Iron supplementation every other day.     Preterm labor symptoms and general obstetric precautions including but not limited to vaginal bleeding, contractions, leaking of fluid and fetal movement were reviewed in detail with the patient. Please refer to After Visit Summary for other counseling recommendations.  Return in about 2 weeks (around 12/20/2023) for next routine prenatal visit.  Future Appointments  Date Time Provider Department Center  12/20/2023  3:00 PM [facility] PROVIDER AC-[facility] None   Jeanne [name] [facility], MPH   Attestation of Supervision of Advanced Practitioner (CNM/PA/NP): Evaluation and management procedures were performed by the Advanced Practice Provider under my supervision and collaboration.  I have reviewed the Advanced Practice Provider's note and chart, and I agree with the management and plan. I have also made any necessary editorial changes.   I was working along side this practitioner all day and all medical plans were discussed with me.   Betsey CHRISTELLA Helling, MD

## 2023-12-06 NOTE — Progress Notes (Addendum)
 Client here for MH RV at 33 6/7. Desires flu vaccine today and RSV vaccine at next visit. States she had kick counts and received cards at last visit. Hulan Parish, RN   Flu vaccine given, tolerated well, VIS and NCIR given. RSV VIS given as well.Hulan Parish, RN

## 2023-12-20 ENCOUNTER — Ambulatory Visit: Admitting: Nurse Practitioner

## 2023-12-20 VITALS — BP 107/59 | HR 74 | Temp 97.2°F | Wt 167.4 lb

## 2023-12-20 DIAGNOSIS — F4329 Adjustment disorder with other symptoms: Secondary | ICD-10-CM

## 2023-12-20 DIAGNOSIS — D509 Iron deficiency anemia, unspecified: Secondary | ICD-10-CM

## 2023-12-20 DIAGNOSIS — Z3480 Encounter for supervision of other normal pregnancy, unspecified trimester: Secondary | ICD-10-CM

## 2023-12-20 DIAGNOSIS — O99013 Anemia complicating pregnancy, third trimester: Secondary | ICD-10-CM

## 2023-12-20 DIAGNOSIS — Z3483 Encounter for supervision of other normal pregnancy, third trimester: Secondary | ICD-10-CM

## 2023-12-20 DIAGNOSIS — Z3A35 35 weeks gestation of pregnancy: Secondary | ICD-10-CM

## 2023-12-20 DIAGNOSIS — R21 Rash and other nonspecific skin eruption: Secondary | ICD-10-CM

## 2023-12-20 MED ORDER — TRIAMCINOLONE ACETONIDE 0.1 % EX CREA: 1.0000 | TOPICAL_CREAM | Freq: Two times a day (BID) | CUTANEOUS | 0 refills | Status: AC

## 2023-12-20 NOTE — Progress Notes (Signed)
 Patient left before getting her RSV vaccine. Client will be here on 12/24/23, please offer at that appointment.Izetta Parish, RN

## 2023-12-20 NOTE — Progress Notes (Addendum)
  [facility] [address] [place] [address] Main phone: [phone]  Prenatal Visit  Subjective:  Jeanne [name] is a 33 y.o. [phone] at [redacted]w[redacted]d being seen today for ongoing prenatal care.  She is currently monitored for the following issues for this low-risk pregnancy:   Patient Active Problem List   Diagnosis Date Noted   Candidal intertrigo 12/06/2023   Iron deficiency anemia of pregnancy 10/25/2023   Supervision of other normal pregnancy, antepartum 07/02/2023   Adjustment disorder 09/09/2018   HPI Patient reports fatigue.  Contractions: Irregular. Vag. Bleeding: None.  Movement: Present. Denies leaking of fluid/ROM.   The following portions of the patient's history were reviewed and updated as appropriate: allergies, current medications, past family history, past medical history, past social history, past surgical history and problem list. Problem list updated.  Objective:   Vitals:   12/20/23 1444  BP: (!) 107/59  Pulse: 74  Temp: (!) 97.2 F (36.2 C)  Weight: 167 lb 6.4 oz (75.9 kg)   Fetal Status: Fetal Heart Rate (bpm): 140 Fundal Height: 36 cm Movement: Present     General:  Alert, oriented and cooperative. Patient is in no acute distress.  Skin: Skin is warm and dry. Rash noted on abdomen, erythema/petechiae from scratching upper abdomen, scattered patchy erythema on lower abdomen along stretch marks  Cardiovascular: Normal heart rate noted  Respiratory: Normal respiratory effort, no problems with respiration noted  Abdomen: Soft, gravid, appropriate for gestational age.  Pain/Pressure: Absent     Pelvic: Cervical exam deferred        Extremities: Normal range of motion.  Edema: None  Mental Status: Normal mood and affect. Normal behavior. Normal judgment and thought content.   Assessment and Plan:  Pregnancy: G5P3013 at [redacted]w[redacted]d  1. [redacted] weeks gestation of pregnancy (Primary)  -  Feeling well but tired, some irregular contractions that she has felt primary in her back, but they go away. No bleeding or fluid leaking. - No headaches, vision changes, or upper abdominal pain  2. Supervision of other normal pregnancy, antepartum  - Will return next Tuesday for 36 week labs  3. Iron deficiency anemia of pregnancy  - Taking iron every other day - Recheck hemoglobin at 36 week lab appt on Tuesday  4. Adjustment disorder with other symptom  - Reports mood as good, no concerns  5. Skin rash  - Rash on abdomen scattered on upper abdomen petechiae likely trauma from scratching, lower abdomen with patchy erythema along stretch marks consistent with PUPPP. - Ambulatory referral to Dermatology - triamcinolone cream (KENALOG) 0.1 %; Apply 1 Application topically 2 (two) times daily.  Dispense: 30 g; Refill: 0  - Advised OTC cetirizine daily  Preterm labor symptoms and general obstetric precautions including but not limited to vaginal bleeding, contractions, leaking of fluid and fetal movement were reviewed in detail with the patient. Please refer to After Visit Summary for other counseling recommendations.   Return in 4 days (on 12/24/2023).  Future Appointments  Date Time Provider Department Center  12/24/2023  4:00 PM [facility] PROVIDER AC-[facility] None   Damien FORBES Satchel, NP

## 2023-12-20 NOTE — Progress Notes (Addendum)
 Here for MH RV at 35 6/7. Desires RSV vaccine today.Hulan Parish, RN

## 2023-12-24 ENCOUNTER — Ambulatory Visit: Admitting: Family Medicine

## 2023-12-24 ENCOUNTER — Encounter: Payer: Self-pay | Admitting: Family Medicine

## 2023-12-24 VITALS — BP 97/52 | HR 69 | Temp 97.6°F | Wt 167.6 lb

## 2023-12-24 DIAGNOSIS — D509 Iron deficiency anemia, unspecified: Secondary | ICD-10-CM

## 2023-12-24 DIAGNOSIS — Z2911 Encounter for prophylactic immunotherapy for respiratory syncytial virus (RSV): Secondary | ICD-10-CM | POA: Diagnosis not present

## 2023-12-24 DIAGNOSIS — Z3A36 36 weeks gestation of pregnancy: Secondary | ICD-10-CM | POA: Diagnosis not present

## 2023-12-24 DIAGNOSIS — Z3483 Encounter for supervision of other normal pregnancy, third trimester: Secondary | ICD-10-CM

## 2023-12-24 DIAGNOSIS — Z3480 Encounter for supervision of other normal pregnancy, unspecified trimester: Secondary | ICD-10-CM

## 2023-12-24 DIAGNOSIS — O99013 Anemia complicating pregnancy, third trimester: Secondary | ICD-10-CM

## 2023-12-24 DIAGNOSIS — F4329 Adjustment disorder with other symptoms: Secondary | ICD-10-CM

## 2023-12-24 DIAGNOSIS — R238 Other skin changes: Secondary | ICD-10-CM

## 2023-12-24 NOTE — Progress Notes (Signed)
  [facility] [address], [address] [place] [address] Main phone: [phone]  Prenatal Visit  Subjective:  Jeanne [name] is a 33 y.o. [phone] at [redacted]w[redacted]d being seen today for ongoing prenatal care.  She is currently monitored for the following issues for this low-risk pregnancy:   Patient Active Problem List   Diagnosis Date Noted   Candidal intertrigo 12/06/2023   Iron deficiency anemia of pregnancy 10/25/2023   Supervision of other normal pregnancy, antepartum 07/02/2023   Adjustment disorder 09/09/2018   HPI Patient reports intermittent braxton hicks, lower abdominal pressure when she walks and lower back pain that both resolves with rest. Pt Denies leaking of fluid/ROM.   The following portions of the patient's history were reviewed and updated as appropriate: allergies, current medications, past family history, past medical history, past social history, past surgical history and problem list. Problem list updated.  Objective:   Vitals:   12/24/23 1655  BP: (!) 97/52  Pulse: 69  Temp: 97.6 F (36.4 C)  Weight: 167 lb 9.6 oz (76 kg)    Fetal Status: Fetal Heart Rate (bpm): 141 Fundal Height: 37 cm Movement: Present  Presentation: Vertex (by leopolds)  General:  Alert, oriented and cooperative. Patient is in no acute distress.  Skin: Skin is warm and dry. No rash noted.   Cardiovascular: Normal heart rate noted  Respiratory: Normal respiratory effort, no problems with respiration noted  Abdomen: Soft, gravid, appropriate for gestational age.  Pain/Pressure: Present     Pelvic: Cervical exam deferred        Extremities: Normal range of motion.  Edema: None  Mental Status: Normal mood and affect. Normal behavior. Normal judgment and thought content.   Assessment and Plan:  Pregnancy: G5P3013 at [redacted]w[redacted]d  1. [redacted] weeks gestation of pregnancy (Primary) -RV in 1 week.  2. Supervision of other normal  pregnancy, antepartum - Pt has no concerns.  -TWG: 27 lb 9.6 oz (12.5 kg)   -Continues to take PNV without concerns.   -Pt self collected 36 labs today. -CMHRP & Abuse screen - negative  -US  ordered with [facility] to verify positioning   - GBS Culture - Chlamydia/GC NAA, Confirmation - Respiratory syncytial virus vaccine, preF, subunit, bivalent,(Abrysvo) - US  OB Follow Up; Future  3. Iron deficiency anemia of pregnancy -Last HgB was 11.0 on 11/22/23 -Pt advised to continue to take PO iron supplementation every other day until delivery.  4. Adjustment disorder with other symptom Pt doing good. -EPDS: 8, offered [facility] counseling, pt declined at this time.  5. Skin irritation -Pt getting relief with triamcinolone and nystatin cream on abdomen.  -Appt with [facility] on 12/30/23   Preterm labor symptoms and general obstetric precautions including but not limited to vaginal bleeding, contractions, leaking of fluid and fetal movement were reviewed in detail with the patient. Please refer to After Visit Summary for other counseling recommendations.  Return in about 1 week (around 12/31/2023) for routine prenatal care.  Future Appointments  Date Time Provider Department Center  12/30/2023  2:30 PM Raymund Lauraine BROCKS, MD [facility] None  01/01/2024  4:00 PM [facility] PROVIDER AC[facility] None    Cortland Crehan GORMAN Pouch, NP

## 2023-12-25 ENCOUNTER — Telehealth: Payer: Self-pay

## 2023-12-25 NOTE — Telephone Encounter (Signed)
 Phone call to patient and informed of [facility] 12/27/23 3:30 ultrasound appt to confirm baby's position. Instructed patient to arrive at [facility] entrance 12/27/23 by 3:15 for check in and to drink 32 ounces of water 1 hour before appt and to not use the restroom until scan is completed. Patient verbalized understanding of above. Niels Devonshire, RN

## 2023-12-27 ENCOUNTER — Ambulatory Visit
Admission: RE | Admit: 2023-12-27 | Discharge: 2023-12-27 | Disposition: A | Discharge status: Home / Self Care | Source: Ambulatory Visit

## 2023-12-27 DIAGNOSIS — Z3A36 36 weeks gestation of pregnancy: Secondary | ICD-10-CM | POA: Diagnosis not present

## 2023-12-27 DIAGNOSIS — Z3483 Encounter for supervision of other normal pregnancy, third trimester: Secondary | ICD-10-CM | POA: Insufficient documentation

## 2023-12-27 NOTE — Progress Notes (Signed)

## 2023-12-28 LAB — CULTURE, BETA STREP (GROUP B ONLY): Strep Gp B Culture: NEGATIVE

## 2023-12-28 LAB — CHLAMYDIA/GC NAA, CONFIRMATION
Chlamydia trachomatis, NAA: NEGATIVE
Neisseria gonorrhoeae, NAA: NEGATIVE

## 2023-12-30 ENCOUNTER — Ambulatory Visit

## 2023-12-30 ENCOUNTER — Ambulatory Visit: Payer: Self-pay | Admitting: Family Medicine

## 2023-12-30 DIAGNOSIS — R21 Rash and other nonspecific skin eruption: Secondary | ICD-10-CM

## 2023-12-30 DIAGNOSIS — Z3480 Encounter for supervision of other normal pregnancy, unspecified trimester: Secondary | ICD-10-CM

## 2023-12-30 MED ORDER — CLOBETASOL PROPIONATE 0.05 % EX OINT: TOPICAL_OINTMENT | CUTANEOUS | 5 refills | Status: AC

## 2023-12-30 NOTE — Patient Instructions (Signed)

## 2023-12-30 NOTE — Progress Notes (Signed)
    Subjective   Jeanne [name] is a 33 y.o. female who presents for the following: Rash. Patient is new patient  Today patient reports: Rash on abdomen and bilateral upper and lower extremities. Patient states she is itchy all over. Patient is currently using Triamcinolone cream prescribed by her OB.   Review of Systems:    No other skin or systemic complaints except as noted in HPI or Assessment and Plan.  The following portions of the chart were reviewed this encounter and updated as appropriate: medications, allergies, medical history  Relevant Medical History:  n/a   Objective  Well appearing patient in no apparent distress; mood and affect are within normal limits. Examination was performed of the: Waist Up Skin Exam: scalp, head, eyes, ears, nose, lips, neck, chest, axillae, upper extremities, abdomen, back, hands, fingers, fingernails   Examination notable qnm:fpoi erythema on abdomen. Excoriation of lower extremities  Examination limited by: Clothing and Patient deferred removal       Assessment & Plan   Pruritus in pregnancy - favor PEP/PUPP vs less likely intrahepatic cholestasis of pregnancy - Differential diagnosis, treatment options, prognosis, risk/ benefit, and side effects of treatment were discussed with the patient.  - Continue triamcinolone 0.1% ointment bid  - Start clobetasol ointment 0.05% twice daily to affected skin to areas not responsive to tac  Discussed side effect of super potent topical steroids including atrophy, dyspigmentation, striae, telangectasia, folliculitis, loss of skin pigment, hair growth, tachyphylaxis, risk of systemic absorption with missuse. Total bile acids and CMP ordered to r/o  intrahepatic cholestasis of pregnancy    Level of service outlined above   Procedures, orders, diagnosis for this visit:  RASH AND OTHER NONSPECIFIC SKIN ERUPTION   Related Procedures Comprehensive metabolic panel with GFR Bile acids,  total  Rash and other nonspecific skin eruption -     Comprehensive metabolic panel with GFR -     Bile acids, total  Other orders -     Clobetasol Propionate; Apply 1 gram topically to affected area of skin twice daily. Stop once resolved and restart as needed for flares. Avoid use on face, armpits, groin unless otherwise indicated.  Dispense: 60 g; Refill: 5    Return to clinic: Return if symptoms worsen or fail to improve.  I, Emerick Ege, CMA am acting as scribe for Lauraine JAYSON Kanaris, MD.   Documentation: I have reviewed the above documentation for accuracy and completeness, and I agree with the above.  Lauraine JAYSON Kanaris, MD

## 2024-01-01 ENCOUNTER — Encounter: Payer: Self-pay | Admitting: Family Medicine

## 2024-01-01 ENCOUNTER — Ambulatory Visit: Admitting: Family Medicine

## 2024-01-01 ENCOUNTER — Ambulatory Visit: Payer: Self-pay

## 2024-01-01 VITALS — BP 100/46 | HR 63 | Temp 97.8°F | Wt 171.4 lb

## 2024-01-01 DIAGNOSIS — D509 Iron deficiency anemia, unspecified: Secondary | ICD-10-CM

## 2024-01-01 DIAGNOSIS — Z3A37 37 weeks gestation of pregnancy: Secondary | ICD-10-CM

## 2024-01-01 DIAGNOSIS — Z3483 Encounter for supervision of other normal pregnancy, third trimester: Secondary | ICD-10-CM

## 2024-01-01 DIAGNOSIS — Z3480 Encounter for supervision of other normal pregnancy, unspecified trimester: Secondary | ICD-10-CM

## 2024-01-01 DIAGNOSIS — O99013 Anemia complicating pregnancy, third trimester: Secondary | ICD-10-CM

## 2024-01-01 DIAGNOSIS — F4329 Adjustment disorder with other symptoms: Secondary | ICD-10-CM

## 2024-01-01 LAB — COMPREHENSIVE METABOLIC PANEL WITH GFR
ALT: 13 IU/L (ref 0–32)
AST: 23 IU/L (ref 0–40)
Albumin: 3.3 g/dL — ABNORMAL LOW (ref 4.0–5.0)
Alkaline Phosphatase: 183 IU/L — ABNORMAL HIGH (ref 41–116)
BUN/Creatinine Ratio: 13 (ref 9–23)
BUN: 6 mg/dL (ref 6–20)
Bilirubin Total: 0.2 mg/dL (ref 0.0–1.2)
CO2: 21 mmol/L (ref 20–29)
Calcium: 8.9 mg/dL (ref 8.7–10.2)
Chloride: 103 mmol/L (ref 96–106)
Creatinine, Ser: 0.48 mg/dL — ABNORMAL LOW (ref 0.57–1.00)
Globulin, Total: 2.6 g/dL (ref 1.5–4.5)
Glucose: 83 mg/dL (ref 70–99)
Potassium: 4.1 mmol/L (ref 3.5–5.2)
Sodium: 136 mmol/L (ref 134–144)
Total Protein: 5.9 g/dL — ABNORMAL LOW (ref 6.0–8.5)
eGFR: 131 mL/min/1.73 (ref >59)

## 2024-01-01 LAB — BILE ACIDS, TOTAL: Bile Acids Total: 3.1 umol/L (ref 0.0–10.0)

## 2024-01-01 NOTE — Progress Notes (Signed)
  [facility] [address], [address] [place] [address] Main phone: [phone]  Prenatal Visit  Subjective:  Jeanne [name] is a 33 y.o. [phone] at [redacted]w[redacted]d being seen today for ongoing prenatal care.  She is currently monitored for the following issues for this low-risk pregnancy:   Patient Active Problem List   Diagnosis Date Noted   Candidal intertrigo 12/06/2023   Iron deficiency anemia of pregnancy 10/25/2023   Supervision of other normal pregnancy, antepartum 07/02/2023   Adjustment disorder 09/09/2018   HPI Patient reports no complaints.  Pt Denies leaking of fluid/ROM.   The following portions of the patient's history were reviewed and updated as appropriate: allergies, current medications, past family history, past medical history, past social history, past surgical history and problem list. Problem list updated.  Objective:   Vitals:   01/01/24 1557  BP: (!) 100/46  Pulse: 63  Temp: 97.8 F (36.6 C)  Weight: 171 lb 6.4 oz (77.7 kg)    Fetal Status: Fetal Heart Rate (bpm): 141 Fundal Height: 38 cm Movement: Present  Presentation: Vertex  General:  Alert, oriented and cooperative. Patient is in no acute distress.  Skin: Skin is warm and dry. No rash noted.   Cardiovascular: Normal heart rate noted  Respiratory: Normal respiratory effort, no problems with respiration noted  Abdomen: Soft, gravid, appropriate for gestational age.  Pain/Pressure: Present     Pelvic: Cervical exam deferred        Extremities: Normal range of motion.  Edema: None  Mental Status: Normal mood and affect. Normal behavior. Normal judgment and thought content.   Assessment and Plan:  Pregnancy: G5P3013 at [redacted]w[redacted]d  1. [redacted] weeks gestation of pregnancy (Primary) -RV in 1 week.  2. Supervision of other normal pregnancy, antepartum BP:  100/46 pt asymptomatic, no s/sx of of weakness, dizziness, or fainting episodes. No  concerns at this time. TWG: 31 lb 6.4 oz (14.2 kg) Has gained 4  lbs in 1 week. Continues to take PNV without concerns. Fetal movement noted, fundal height appropriate for GA. No concerns at visit today.  -Reviewed 36 week labs with pt.  -US  at [facility] on 10/31 - cephalic presentation, AFI WNL -Had appt with [facility] 12/30/23-  continues to use clobetasol as rx. No further concerns pt is experiencing improvement with rash.  3. Iron deficiency anemia of pregnancy -Pt continues to take PO iron supplementation every other day.  4. Adjustment disorder with other symptom -Pt doing good, no concerns present during visit. Will continue to monitor.   Term labor symptoms and general obstetric precautions including but not limited to vaginal bleeding, contractions, leaking of fluid and fetal movement were reviewed in detail with the patient. Please refer to After Visit Summary for other counseling recommendations.   Return in about 1 week (around 01/08/2024) for routine prenatal care.  Future Appointments  Date Time Provider Department Center  01/09/2024  8:20 AM [facility] PROVIDER [facility] None    Jocilynn Grade GORMAN Pouch, NP

## 2024-01-01 NOTE — Progress Notes (Signed)
 Correctly verbalizes how to take iron tablet and prenatal vitamin. Taking iron tablet with juice. Kept 12/27/23 OB US  appt for presentation. Kept 11#25 [facility] appt and picked up clobetasol ointment today. Plans to start later today. Counseled 36 week cultures negative. 1 week [facility] RV appt scheduled and reminder card given. Burnadette Lowers, RN

## 2024-01-01 NOTE — Telephone Encounter (Signed)
 Patient informed of lab results.

## 2024-01-01 NOTE — Telephone Encounter (Signed)
-----   Message from Lauraine JAYSON Kanaris sent at 01/01/2024 12:27 PM EST ----- Please notify patient with below plan: Labs wnl  ----- Message ----- From: Rebecka Memos Lab Results In Sent: 01/01/2024   7:36 AM EST To: Lauraine JAYSON Kanaris, MD

## 2024-01-02 ENCOUNTER — Telehealth: Payer: Self-pay

## 2024-01-02 NOTE — Telephone Encounter (Signed)
 Per e-mail received from RN at [facility], BTL consent signed by client at [facility] 11/27/23 expired 08/2023. Medicaid will not accept current consent and a new consent needs to be signed. Call made to client with above information and requested she come ASAP to [facility] so new consent can be signed / faxed to [facility]. Number to call provided. Burnadette Lowers, RN

## 2024-01-09 ENCOUNTER — Ambulatory Visit: Admitting: Physician Assistant

## 2024-01-09 VITALS — BP 103/63 | HR 58 | Temp 98.0°F | Wt 171.4 lb

## 2024-01-09 DIAGNOSIS — Z3480 Encounter for supervision of other normal pregnancy, unspecified trimester: Secondary | ICD-10-CM

## 2024-01-09 DIAGNOSIS — Z3483 Encounter for supervision of other normal pregnancy, third trimester: Secondary | ICD-10-CM

## 2024-01-09 DIAGNOSIS — O99013 Anemia complicating pregnancy, third trimester: Secondary | ICD-10-CM

## 2024-01-09 DIAGNOSIS — Z3A38 38 weeks gestation of pregnancy: Secondary | ICD-10-CM

## 2024-01-09 DIAGNOSIS — L308 Other specified dermatitis: Secondary | ICD-10-CM | POA: Insufficient documentation

## 2024-01-09 DIAGNOSIS — D509 Iron deficiency anemia, unspecified: Secondary | ICD-10-CM

## 2024-01-09 NOTE — Progress Notes (Signed)
  [facility] [address] [place] [address] Main phone: [phone]  Prenatal Visit  Subjective:  Jeanne [name] is a 33 y.o. [phone] at [redacted]w[redacted]d being seen today for ongoing prenatal care.  She is currently monitored for the following issues for this low-risk pregnancy:   Patient Active Problem List   Diagnosis Date Noted   Pruritic dermatitis 01/09/2024   Candidal intertrigo 12/06/2023   Iron deficiency anemia of pregnancy 10/25/2023   Supervision of other normal pregnancy, antepartum 07/02/2023   Adjustment disorder 09/09/2018   HPI Patient reports she feels well except for ongoing body itching, especially of her feet.  Contractions: Not present. Vag. Bleeding: None.  Movement: Present. Denies leaking of fluid/ROM.   The following portions of the patient's history were reviewed and updated as appropriate: allergies, current medications, past family history, past medical history, past social history, past surgical history and problem list. Problem list updated.  Objective:   Vitals:   01/09/24 0841  BP: 103/63  Pulse: (!) 58  Temp: 98 F (36.7 C)  Weight: 171 lb 6.4 oz (77.7 kg)    Fetal Status: Fetal Heart Rate (bpm): 152 Fundal Height: 38 cm Movement: Present     General:  Alert, oriented and cooperative. Patient is in no acute distress.  Skin: Skin is warm and dry. No rash noted.   Cardiovascular: Normal heart rate noted  Respiratory: Normal respiratory effort, no problems with respiration noted  Abdomen: Soft, gravid, appropriate for gestational age.  Pain/Pressure: Present     Pelvic: Cervical exam performed        Extremities: Normal range of motion.  Edema: None  Mental Status: Normal mood and affect. Normal behavior. Normal judgment and thought content.   Assessment and Plan:  Pregnancy: G5P3013 at [redacted]w[redacted]d  1. [redacted] weeks gestation of pregnancy (Primary) RV in 1 week.  2.  Supervision of other normal pregnancy, antepartum Continue prenatal vitamins. Has infant supplies and feels ready for baby. FMLA paperwork for postpartum leave to be completed for pt to pick up at a later late.  3. Iron deficiency anemia of pregnancy Continue oral iron supplement.  4. Pruritic dermatitis Had dermatology eval with normal serum bile acids 12/30/23 and was placed on topical steroid, which helps a little with the itching. She is encouraged to continue the steroid.    Term labor symptoms and general obstetric precautions including but not limited to vaginal bleeding, contractions, leaking of fluid and fetal movement were reviewed in detail with the patient. Please refer to After Visit Summary for other counseling recommendations.  Return in about 1 week (around 01/16/2024) for Routine prenatal care.  Future Appointments  Date Time Provider Department Center  01/17/2024  3:20 PM [facility] PROVIDER [facility] None    Anne Boltz, PA-C

## 2024-01-09 NOTE — Progress Notes (Signed)
 Here for MH RV at 38 5/7. Complains of swelling feet and itching feet.Hulan Parish, RN

## 2024-01-12 ENCOUNTER — Encounter: Payer: Self-pay | Admitting: Family Medicine

## 2024-01-16 ENCOUNTER — Telehealth: Payer: Self-pay | Admitting: Physician Assistant

## 2024-01-16 ENCOUNTER — Telehealth: Payer: Self-pay

## 2024-01-16 NOTE — Telephone Encounter (Signed)
 FMLA paperwork completed 01/09/24 by A. Streilein PA-C given to her today for review. Burnadette Lowers, RN

## 2024-01-16 NOTE — Telephone Encounter (Signed)
 Called pt to discuss need for additional info to modify 01/09/24 FMLA paperwork. Pt opts to discuss in person with provider at maternity visit as scheduled tomorrow 01/17/24.  Jeanne Conde PA-C

## 2024-01-16 NOTE — Telephone Encounter (Signed)
 Per LOIS Parish RN, BTL consent signed on current form 01/03/24. Burnadette Lowers, RN

## 2024-01-16 NOTE — Telephone Encounter (Signed)
 Client presented to clerical window yesterday stating her employer was requiring more information to support her disability request / update forms. Client printed informational sheet from her employer regarding request for more information (to clerical fax) and info then brought to clinic . (FMLA paperwork completed 01/09/24 and faxed with confirmation received). Call to client today and requested she obtain specific type information needed by employer and discuss with provider at 01/17/24 [facility] RV appt. Client agrees to do so. Copy of information and fax confirmation from 01/09/24 available for client to pick up at 01/17/24 appt. Burnadette Lowers, RN

## 2024-01-17 ENCOUNTER — Encounter: Payer: Self-pay | Admitting: Family Medicine

## 2024-01-17 ENCOUNTER — Ambulatory Visit: Admitting: Family Medicine

## 2024-01-17 VITALS — BP 114/73 | HR 76 | Temp 98.2°F | Wt 172.4 lb

## 2024-01-17 DIAGNOSIS — L308 Other specified dermatitis: Secondary | ICD-10-CM

## 2024-01-17 DIAGNOSIS — D509 Iron deficiency anemia, unspecified: Secondary | ICD-10-CM

## 2024-01-17 DIAGNOSIS — Z3483 Encounter for supervision of other normal pregnancy, third trimester: Secondary | ICD-10-CM

## 2024-01-17 DIAGNOSIS — Z3480 Encounter for supervision of other normal pregnancy, unspecified trimester: Secondary | ICD-10-CM

## 2024-01-17 DIAGNOSIS — O99013 Anemia complicating pregnancy, third trimester: Secondary | ICD-10-CM

## 2024-01-17 DIAGNOSIS — Z3A39 39 weeks gestation of pregnancy: Secondary | ICD-10-CM

## 2024-01-17 NOTE — Progress Notes (Signed)
 [facility] [address], [address] [place] [address] Main phone: [phone]  Prenatal Visit  Subjective:  Jeanne [name] is a 33 y.o. [phone] at [redacted]w[redacted]d being seen today for ongoing prenatal care.  She is currently monitored for the following issues for this low-risk pregnancy:   Patient Active Problem List   Diagnosis Date Noted   Pruritic dermatitis 01/09/2024   Candidal intertrigo 12/06/2023   Iron deficiency anemia of pregnancy 10/25/2023   Supervision of other normal pregnancy, antepartum 07/02/2023   Adjustment disorder 09/09/2018   HPI Patient reports itching all over skin Pt used clobetasol  from dermatology for 1 week with relief. Now itching has started all over mainly on arms and legs where clobetasol  only helps provide relief for 1-2 hours. Pt uses moisturizes which aren't helping and only additional comfort is from wearing loose clothing and taking cold showers. Pt Denies leaking of fluid/ROM or vaginal bleeding.  The following portions of the patient's history were reviewed and updated as appropriate: allergies, current medications, past family history, past medical history, past social history, past surgical history and problem list. Problem list updated.  Objective:   Vitals:   01/17/24 1513  BP: 114/73  Pulse: 76  Temp: 98.2 F (36.8 C)  Weight: 172 lb 6.4 oz (78.2 kg)    Fetal Status: Fetal Heart Rate (bpm): 154 Fundal Height: 39 cm Movement: Present  Presentation: Vertex (by leopolds.)  General:  Alert, oriented and cooperative. Patient is in no acute distress.  Skin: Skin is warm and dry. No rash noted.   Cardiovascular: Normal heart rate noted  Respiratory: Normal respiratory effort, no problems with respiration noted  Abdomen: Soft, gravid, appropriate for gestational age.  Pain/Pressure: Present     Pelvic: Cervical exam deferred        Extremities: Normal range of motion.      Mental Status: Normal mood and affect. Normal behavior. Normal judgment and thought content.   Assessment and Plan:  Pregnancy: G5P3013 at [redacted]w[redacted]d  1. [redacted] weeks gestation of pregnancy (Primary) -RV in 1 week.   2. Supervision of other normal pregnancy, antepartum  -TWG: 32 lb 6.4 oz (14.7 kg) Has gained 1  lbs in 1 weeks.  Continues to take PNV without concerns.   -Fetal movement noted, fundal height appropriate for GA.  -IOL forms filled out to be faxed on 01/17/24  3. Pruritic dermatitis -Advised patient to continue to use clobetasol  as needed. Also provided recommendation to try to use OTC hydrocortisone  to help aid in providing relief from itching particularly at night before bedtime and as needed throughout the day. Provided handout of OTC hydrocortisone  cream.  4. Iron deficiency anemia of pregnancy -Continues to take iron supplementation.    Term labor symptoms and general obstetric precautions including but not limited to vaginal bleeding, contractions, leaking of fluid and fetal movement were reviewed in detail with the patient. Please refer to After Visit Summary for other counseling recommendations.  Return in about 1 week (around 01/24/2024) for routine prenatal care.  Future Appointments  Date Time Provider Department Center  01/27/2024 10:35 AM [facility] PROVIDER [facility] None    Tashawn Laswell GORMAN Pouch, NP  Attestation of Supervision of Advanced Practitioner (CNM/PA/NP): Evaluation and management procedures were performed by the Advanced Practice Provider under my supervision and collaboration.  I have reviewed the Advanced Practice Provider's note and chart, and I agree with the management and plan. I have also made any necessary editorial changes.  I was working along side this practitioner all day and all medical plans were discussed with me.   Verneta Bers, [place]

## 2024-01-17 NOTE — Progress Notes (Signed)
 Returned Short Term Disability paperwork to patient. IOL request faxed. BTHIELE RN

## 2024-01-20 ENCOUNTER — Telehealth: Payer: Self-pay

## 2024-01-20 NOTE — Telephone Encounter (Signed)
 TC to patient to inform of IOL date of 01/30/24 at 0800. Patient states she just received a phone call informing her of that. Next appointment 01/27/24. Patient will need NST at 41 weeks. Hulan Parish, RN

## 2024-01-21 ENCOUNTER — Inpatient Hospital Stay: Admitting: Anesthesiology

## 2024-01-21 ENCOUNTER — Encounter: Payer: Self-pay | Admitting: Obstetrics & Gynecology

## 2024-01-21 ENCOUNTER — Inpatient Hospital Stay
Admission: EM | Admit: 2024-01-21 | Discharge: 2024-01-22 | DRG: 768 | Disposition: A | Discharge status: Home / Self Care | Attending: Obstetrics | Admitting: Obstetrics

## 2024-01-21 ENCOUNTER — Other Ambulatory Visit: Payer: Self-pay

## 2024-01-21 ENCOUNTER — Encounter: Payer: Self-pay | Admitting: General Practice

## 2024-01-21 ENCOUNTER — Encounter
Admission: EM | Disposition: A | Discharge status: Home / Self Care | Payer: Self-pay | Source: Home / Self Care | Attending: Obstetrics

## 2024-01-21 DIAGNOSIS — Z8616 Personal history of COVID-19: Secondary | ICD-10-CM

## 2024-01-21 DIAGNOSIS — O480 Post-term pregnancy: Secondary | ICD-10-CM | POA: Diagnosis not present

## 2024-01-21 DIAGNOSIS — Z9851 Tubal ligation status: Secondary | ICD-10-CM

## 2024-01-21 DIAGNOSIS — Z3A40 40 weeks gestation of pregnancy: Secondary | ICD-10-CM

## 2024-01-21 DIAGNOSIS — O26893 Other specified pregnancy related conditions, third trimester: Secondary | ICD-10-CM | POA: Diagnosis present

## 2024-01-21 DIAGNOSIS — Z302 Encounter for sterilization: Secondary | ICD-10-CM | POA: Diagnosis not present

## 2024-01-21 DIAGNOSIS — Z3483 Encounter for supervision of other normal pregnancy, third trimester: Secondary | ICD-10-CM | POA: Diagnosis not present

## 2024-01-21 LAB — CBC WITH DIFFERENTIAL/PLATELET
Abs Immature Granulocytes: 0.09 K/uL — ABNORMAL HIGH (ref 0.00–0.07)
Basophils Absolute: 0 K/uL (ref 0.0–0.1)
Basophils Relative: 0 %
Eosinophils Absolute: 0.2 K/uL (ref 0.0–0.5)
Eosinophils Relative: 2 %
HCT: 30.2 % — ABNORMAL LOW (ref 36.0–46.0)
Hemoglobin: 10.2 g/dL — ABNORMAL LOW (ref 12.0–15.0)
Immature Granulocytes: 1 %
Lymphocytes Relative: 14 %
Lymphs Abs: 1.5 K/uL (ref 0.7–4.0)
MCH: 30.1 pg (ref 26.0–34.0)
MCHC: 33.8 g/dL (ref 30.0–36.0)
MCV: 89.1 fL (ref 80.0–100.0)
Monocytes Absolute: 0.4 K/uL (ref 0.1–1.0)
Monocytes Relative: 4 %
Neutro Abs: 8.4 K/uL — ABNORMAL HIGH (ref 1.7–7.7)
Neutrophils Relative %: 79 %
Platelets: 102 K/uL — ABNORMAL LOW (ref 150–400)
RBC: 3.39 MIL/uL — ABNORMAL LOW (ref 3.87–5.11)
RDW: 15.3 % (ref 11.5–15.5)
WBC: 10.7 K/uL — ABNORMAL HIGH (ref 4.0–10.5)
nRBC: 0 % (ref 0.0–0.2)

## 2024-01-21 LAB — POCT I-STAT, CHEM 8
BUN: 4 mg/dL — ABNORMAL LOW (ref 6–20)
Calcium, Ion: 1.12 mmol/L — ABNORMAL LOW (ref 1.15–1.40)
Chloride: 105 mmol/L (ref 98–111)
Creatinine, Ser: 0.5 mg/dL (ref 0.44–1.00)
Glucose, Bld: 84 mg/dL (ref 70–99)
HCT: 31 % — ABNORMAL LOW (ref 36.0–46.0)
Hemoglobin: 10.5 g/dL — ABNORMAL LOW (ref 12.0–15.0)
Potassium: 4.1 mmol/L (ref 3.5–5.1)
Sodium: 135 mmol/L (ref 135–145)
TCO2: 20 mmol/L — ABNORMAL LOW (ref 22–32)

## 2024-01-21 LAB — TYPE AND SCREEN
ABO/RH(D): O POS
Antibody Screen: NEGATIVE

## 2024-01-21 LAB — CBC
HCT: 40.2 % (ref 36.0–46.0)
Hemoglobin: 13.3 g/dL (ref 12.0–15.0)
MCH: 29.8 pg (ref 26.0–34.0)
MCHC: 33.1 g/dL (ref 30.0–36.0)
MCV: 90.1 fL (ref 80.0–100.0)
Platelets: 101 K/uL — ABNORMAL LOW (ref 150–400)
RBC: 4.46 MIL/uL (ref 3.87–5.11)
RDW: 15.2 % (ref 11.5–15.5)
WBC: 7.1 K/uL (ref 4.0–10.5)
nRBC: 0.7 % — ABNORMAL HIGH (ref 0.0–0.2)

## 2024-01-21 LAB — SYPHILIS: RPR W/REFLEX TO RPR TITER AND TREPONEMAL ANTIBODIES, TRADITIONAL SCREENING AND DIAGNOSIS ALGORITHM: RPR Ser Ql: NONREACTIVE

## 2024-01-21 SURGERY — LIGATION, FALLOPIAN TUBE, POSTPARTUM
Anesthesia: Choice | Laterality: Bilateral

## 2024-01-21 MED ORDER — ACETAMINOPHEN 325 MG PO TABS: 650.0000 mg | ORAL_TABLET | ORAL | Status: DC | PRN

## 2024-01-21 MED ORDER — OXYTOCIN BOLUS FROM INFUSION
333.0000 mL | Freq: Once | INTRAVENOUS | Status: AC
  Administered 2024-01-21: 333 mL via INTRAVENOUS

## 2024-01-21 MED ORDER — LIDOCAINE HCL (PF) 1 % IJ SOLN: 30.0000 mL | INTRAMUSCULAR | Status: DC | PRN

## 2024-01-21 MED ORDER — BENZOCAINE-MENTHOL 20-0.5 % EX AERO
1.0000 | INHALATION_SPRAY | CUTANEOUS | Status: DC | PRN
Start: 2024-01-21 — End: 2024-01-22

## 2024-01-21 MED ORDER — LACTATED RINGERS IV SOLN
500.0000 mL | Freq: Once | INTRAVENOUS | Status: AC
  Administered 2024-01-21: 500 mL via INTRAVENOUS

## 2024-01-21 MED ORDER — FENTANYL CITRATE (PF) 100 MCG/2ML IJ SOLN
INTRAMUSCULAR | Status: AC
  Filled 2024-01-21: qty 2

## 2024-01-21 MED ORDER — DIBUCAINE (PERIANAL) 1 % EX OINT: 1.0000 | TOPICAL_OINTMENT | CUTANEOUS | Status: DC | PRN

## 2024-01-21 MED ORDER — TERBUTALINE SULFATE 1 MG/ML IJ SOLN: 0.2500 mg | Freq: Once | INTRAMUSCULAR | Status: DC | PRN

## 2024-01-21 MED ORDER — TETANUS-DIPHTH-ACELL PERTUSSIS 5-2-15.5 LF-MCG/0.5 IM SUSP: 0.5000 mL | Freq: Once | INTRAMUSCULAR | Status: DC

## 2024-01-21 MED ORDER — OXYTOCIN 10 UNIT/ML IJ SOLN
INTRAMUSCULAR | Status: AC
  Filled 2024-01-21: qty 2

## 2024-01-21 MED ORDER — FERROUS SULFATE 325 (65 FE) MG PO TABS: 325.0000 mg | ORAL_TABLET | Freq: Two times a day (BID) | ORAL | Status: DC

## 2024-01-21 MED ORDER — FENTANYL CITRATE (PF) 100 MCG/2ML IJ SOLN
50.0000 ug | INTRAMUSCULAR | Status: DC | PRN
  Administered 2024-01-21: 100 ug via INTRAVENOUS
  Filled 2024-01-21: qty 2

## 2024-01-21 MED ORDER — OXYTOCIN-SODIUM CHLORIDE 30-0.9 UT/500ML-% IV SOLN: 1.0000 m[IU]/min | INTRAVENOUS | Status: DC

## 2024-01-21 MED ORDER — EPHEDRINE 5 MG/ML INJ: 10.0000 mg | INTRAVENOUS | Status: DC | PRN

## 2024-01-21 MED ORDER — ONDANSETRON HCL 4 MG PO TABS: 4.0000 mg | ORAL_TABLET | ORAL | Status: DC | PRN

## 2024-01-21 MED ORDER — LIDOCAINE HCL (PF) 1 % IJ SOLN
INTRAMUSCULAR | Status: DC | PRN
  Administered 2024-01-21: 3 mL

## 2024-01-21 MED ORDER — PRENATAL MULTIVITAMIN CH
1.0000 | ORAL_TABLET | Freq: Every day | ORAL | Status: DC
  Administered 2024-01-22: 1 via ORAL
  Filled 2024-01-21: qty 1

## 2024-01-21 MED ORDER — CHLORHEXIDINE GLUCONATE 0.12 % MT SOLN
15.0000 mL | Freq: Once | OROMUCOSAL | Status: AC
  Administered 2024-01-21: 15 mL via OROMUCOSAL

## 2024-01-21 MED ORDER — SIMETHICONE 80 MG PO CHEW
80.0000 mg | CHEWABLE_TABLET | ORAL | Status: DC | PRN
Start: 2024-01-21 — End: 2024-01-22

## 2024-01-21 MED ORDER — WITCH HAZEL-GLYCERIN EX PADS: 1.0000 | MEDICATED_PAD | CUTANEOUS | Status: DC | PRN

## 2024-01-21 MED ORDER — LIDOCAINE-EPINEPHRINE (PF) 1.5 %-1:200000 IJ SOLN
INTRAMUSCULAR | Status: DC | PRN
  Administered 2024-01-21: 3 mL via PERINEURAL

## 2024-01-21 MED ORDER — CEFAZOLIN SODIUM-DEXTROSE 2-4 GM/100ML-% IV SOLN
2.0000 g | Freq: Once | INTRAVENOUS | Status: AC
  Administered 2024-01-21: 2 g via INTRAVENOUS
  Filled 2024-01-21: qty 100

## 2024-01-21 MED ORDER — COCONUT OIL OIL
1.0000 | TOPICAL_OIL | Status: DC | PRN
  Filled 2024-01-21: qty 15

## 2024-01-21 MED ORDER — FENTANYL-BUPIVACAINE-NACL 0.5-0.125-0.9 MG/250ML-% EP SOLN
12.0000 mL/h | EPIDURAL | Status: DC | PRN
  Administered 2024-01-21: 12 mL/h via EPIDURAL
  Filled 2024-01-21: qty 250

## 2024-01-21 MED ORDER — CHLORHEXIDINE GLUCONATE 0.12 % MT SOLN
OROMUCOSAL | Status: AC
  Filled 2024-01-21: qty 15

## 2024-01-21 MED ORDER — PHENYLEPHRINE 80 MCG/ML (10ML) SYRINGE FOR IV PUSH (FOR BLOOD PRESSURE SUPPORT): 80.0000 ug | PREFILLED_SYRINGE | INTRAVENOUS | Status: DC | PRN

## 2024-01-21 MED ORDER — OXYTOCIN-SODIUM CHLORIDE 30-0.9 UT/500ML-% IV SOLN
2.5000 [IU]/h | INTRAVENOUS | Status: DC
  Administered 2024-01-21: 2.5 [IU]/h via INTRAVENOUS
  Filled 2024-01-21: qty 500

## 2024-01-21 MED ORDER — LACTATED RINGERS IV SOLN: INTRAVENOUS | Status: DC

## 2024-01-21 MED ORDER — ONDANSETRON HCL 4 MG/2ML IJ SOLN: 4.0000 mg | Freq: Four times a day (QID) | INTRAMUSCULAR | Status: DC | PRN

## 2024-01-21 MED ORDER — DOCUSATE SODIUM 100 MG PO CAPS
100.0000 mg | ORAL_CAPSULE | Freq: Two times a day (BID) | ORAL | Status: DC
Start: 2024-01-21 — End: 2024-01-22
  Administered 2024-01-21 – 2024-01-22 (×2): 100 mg via ORAL
  Filled 2024-01-21 (×2): qty 1

## 2024-01-21 MED ORDER — BUPIVACAINE HCL (PF) 0.25 % IJ SOLN
INTRAMUSCULAR | Status: DC | PRN
  Administered 2024-01-21 (×2): 5 mL via EPIDURAL

## 2024-01-21 MED ORDER — AMMONIA AROMATIC IN INHA
RESPIRATORY_TRACT | Status: AC
  Filled 2024-01-21: qty 10

## 2024-01-21 MED ORDER — ONDANSETRON HCL 4 MG/2ML IJ SOLN
4.0000 mg | INTRAMUSCULAR | Status: DC | PRN
  Administered 2024-01-22: 4 mg via INTRAVENOUS

## 2024-01-21 MED ORDER — HYDROXYZINE HCL 25 MG PO TABS: 50.0000 mg | ORAL_TABLET | Freq: Four times a day (QID) | ORAL | Status: DC | PRN

## 2024-01-21 MED ORDER — IBUPROFEN 600 MG PO TABS
600.0000 mg | ORAL_TABLET | Freq: Four times a day (QID) | ORAL | Status: DC
  Administered 2024-01-21 – 2024-01-22 (×3): 600 mg via ORAL
  Filled 2024-01-21 (×3): qty 1

## 2024-01-21 MED ORDER — SOD CITRATE-CITRIC ACID 500-334 MG/5ML PO SOLN: 30.0000 mL | ORAL | Status: DC | PRN

## 2024-01-21 MED ORDER — LACTATED RINGERS IV SOLN
500.0000 mL | INTRAVENOUS | Status: DC | PRN
  Administered 2024-01-21 (×2): 500 mL via INTRAVENOUS

## 2024-01-21 MED ORDER — DIPHENHYDRAMINE HCL 25 MG PO CAPS: 25.0000 mg | ORAL_CAPSULE | Freq: Four times a day (QID) | ORAL | Status: DC | PRN

## 2024-01-21 MED ORDER — ZOLPIDEM TARTRATE 5 MG PO TABS: 5.0000 mg | ORAL_TABLET | Freq: Every evening | ORAL | Status: DC | PRN

## 2024-01-21 MED ORDER — LIDOCAINE HCL (PF) 1 % IJ SOLN
INTRAMUSCULAR | Status: AC
  Filled 2024-01-21: qty 30

## 2024-01-21 MED ORDER — MIDAZOLAM HCL 2 MG/2ML IJ SOLN
INTRAMUSCULAR | Status: AC
  Filled 2024-01-21: qty 2

## 2024-01-21 MED ORDER — ACETAMINOPHEN 500 MG PO TABS
1000.0000 mg | ORAL_TABLET | Freq: Four times a day (QID) | ORAL | Status: DC
  Administered 2024-01-21 – 2024-01-22 (×4): 1000 mg via ORAL
  Filled 2024-01-21 (×4): qty 2

## 2024-01-21 MED ORDER — MISOPROSTOL 200 MCG PO TABS
ORAL_TABLET | ORAL | Status: AC
  Administered 2024-01-21: 800 ug via RECTAL
  Filled 2024-01-21: qty 4

## 2024-01-21 MED ORDER — DIPHENHYDRAMINE HCL 50 MG/ML IJ SOLN: 12.5000 mg | INTRAMUSCULAR | Status: DC | PRN

## 2024-01-21 NOTE — Anesthesia Preprocedure Evaluation (Signed)
 Anesthesia Evaluation  Patient identified by MRN, date of birth, ID band Patient awake    Reviewed: Allergy & Precautions, NPO status , Patient's Chart, lab work & pertinent test results  History of Anesthesia Complications Negative for: history of anesthetic complications  Airway Mallampati: IV  TM Distance: >3 FB Neck ROM: full    Dental no notable dental hx.    Pulmonary neg pulmonary ROS   Pulmonary exam normal        Cardiovascular Exercise Tolerance: Good negative cardio ROS Normal cardiovascular exam     Neuro/Psych  PSYCHIATRIC DISORDERS Anxiety     negative neurological ROS     GI/Hepatic negative GI ROS, Neg liver ROS,,,  Endo/Other  negative endocrine ROS    Renal/GU negative Renal ROS  negative genitourinary   Musculoskeletal   Abdominal   Peds  Hematology  (+) Blood dyscrasia, anemia   Anesthesia Other Findings Past Medical History: No date: Anemia No date: Anxiety 10/27/2018: History of COVID-19     Comment:  Tested + on 10/17/18  Hospitalized [facility] for SOB               11/01/18-11/03/18 Solumedrol + antibiotics forf SOB  Finished              with isolation 11/07/18     07/11/2023: Subchorionic hemorrhage in first trimester     Comment:  Seen on dating US  07/08/23.  08/19/23 US : Pl[place]ental [place]               noted. No mention of subchorionic hemorrhage.     Past Surgical History: No date: DILATION AND CURETTAGE OF UTERUS 02/09/2015: DILATION AND EVACUATION; N/A     Comment:  Procedure: DILATATION AND EVACUATION;  Surgeon: Heather Penton, MD;  Location: [facility] ORS;  Service: Gynecology;                Laterality: N/A; 02/09/2015: LAPAROSCOPY     Comment:  Procedure: LAPAROSCOPY DIAGNOSTIC;  Surgeon: Heather Penton, MD;  Location: [facility] ORS;  Service: Gynecology;; No date: NO PAST SURGERIES  BMI    Body Mass Index: 33.67 kg/m      Reproductive/Obstetrics (+)  Pregnancy                              Anesthesia Physical Anesthesia Plan  ASA: 2  Anesthesia Plan: Epidural   Post-op Pain Management:    Induction:   PONV Risk Score and Plan:   Airway Management Planned: Natural Airway  Additional Equipment:   Intra-op Plan:   Post-operative Plan:   Informed Consent: I have reviewed the patients History and Physical, chart, labs and discussed the procedure including the risks, benefits and alternatives for the proposed anesthesia with the patient or authorized representative who has indicated his/her understanding and acceptance.     Dental Advisory Given  Plan Discussed with: Anesthesiologist  Anesthesia Plan Comments: (Patient reports no bleeding problems and no anticoagulant use.   Patient consented for risks of anesthesia including but not limited to:  - adverse reactions to medications - risk of bleeding, infection and or nerve damage from epidural that could lead to paralysis - risk of headache or failed epidural - nerve damage due to positioning - that if epidural is used for C-section that there is  a chance of epidural failure requiring spinal placement or conversion to GA - Damage to heart, brain, lungs, other parts of body or loss of life  Patient voiced understanding and assent.)         Anesthesia Quick Evaluation

## 2024-01-21 NOTE — Progress Notes (Signed)
 Jeanne [name] is a 33 y.o. [phone] at [redacted]w[redacted]d by LMP admitted for active labor  Subjective: Comfortable with epidural. Offered AROM, accepted. Partner at the bedside.   Objective: BP 110/88   Pulse (!) 51   Temp 98 F (36.7 C) (Oral)   Resp 16   Ht 5' (1.524 m)   Wt 78.2 kg   LMP 04/13/2023 (Approximate)   SpO2 100%   BMI 33.67 kg/m  No intake/output data recorded. No intake/output data recorded.  FHT:  FHR: 140 bpm, variability: moderate,  accelerations:  Abscent,  decelerations:  Present early or variable  UC:   regular, every 1.5-2.5 minutes SVE:   Dilation: 9 Effacement (%): 90 Station: -1 Exam by:: Jeanne [name] CNM  Labs: Lab Results  Component Value Date   WBC 7.1 01/21/2024   HGB 13.3 01/21/2024   HCT 40.2 01/21/2024   MCV 90.1 01/21/2024   PLT 101 (Jeanne) 01/21/2024    Assessment / Plan: Spontaneous labor, progressing normally  Labor: Progressing normally Fetal Wellbeing:  Category II, FSE placed  Pain Control:  Epidural I/D:  GSB negative, AROM for clear-bloody fluid just now  Anticipated MOD:  NSVD  Dr Jeanne [name] updated on pt's status  Jeanne [name] [facility], CNM 01/21/2024, 10:18 AM

## 2024-01-21 NOTE — Anesthesia Procedure Notes (Signed)
 Epidural Patient location during procedure: OB Start time: 01/21/2024 6:38 AM End time: 01/21/2024 6:39 AM  Staffing Anesthesiologist: Chesley Lendia CROME, MD Performed: anesthesiologist   Preanesthetic Checklist Completed: patient identified, IV checked, site marked, risks and benefits discussed, surgical consent, monitors and equipment checked, pre-op evaluation and timeout performed  Epidural Patient position: sitting Prep: ChloraPrep Patient monitoring: heart rate, continuous pulse ox and blood pressure Approach: midline Location: L3-L4 Injection technique: LOR saline  Needle:  Needle insertion depth: 5.5 cm Needle type: Tuohy  Needle gauge: 17 G Needle length: 9 cm and 9 Needle insertion depth: 5.5 cm Catheter type: closed end flexible Catheter size: 19 Gauge Catheter at skin depth: 10 cm Test dose: negative and 1.5% lidocaine  with Epi 1:200 K  Assessment Sensory level: T10 Events: blood not aspirated, injection not painful, no injection resistance, no paresthesia and negative IV test  Additional Notes 1 attempt Pt. Evaluated and documentation done after procedure finished. Patient identified. Risks/Benefits/Options discussed with patient including but not limited to bleeding, infection, nerve damage, paralysis, failed block, incomplete pain control, headache, blood pressure changes, nausea, vomiting, reactions to medication both or allergic, itching and postpartum back pain. Confirmed with bedside nurse the patient's most recent platelet count. Confirmed with patient that they are not currently taking any anticoagulation, have any bleeding history or any family history of bleeding disorders. Patient expressed understanding and wished to proceed. All questions were answered. Sterile technique was used throughout the entire procedure. Please see nursing notes for vital signs. Test dose was given through epidural catheter and negative prior to continuing to dose epidural or start  infusion. Warning signs of high block given to the patient including shortness of breath, tingling/numbness in hands, complete motor block, or any concerning symptoms with instructions to call for help. Patient was given instructions on fall risk and not to get out of bed. All questions and concerns addressed with instructions to call with any issues or inadequate analgesia.    Patient tolerated the insertion well without immediate complications. Reason for block:procedure for pain

## 2024-01-21 NOTE — Discharge Instructions (Signed)

## 2024-01-21 NOTE — Anesthesia Preprocedure Evaluation (Signed)
 Anesthesia Evaluation  Patient identified by MRN, date of birth, ID band Patient awake    Reviewed: Allergy & Precautions, NPO status , Patient's Chart, lab work & pertinent test results  History of Anesthesia Complications Negative for: history of anesthetic complications  Airway Mallampati: IV  TM Distance: >3 FB Neck ROM: full    Dental no notable dental hx.    Pulmonary neg pulmonary ROS   Pulmonary exam normal        Cardiovascular Exercise Tolerance: Good negative cardio ROS Normal cardiovascular exam     Neuro/Psych  PSYCHIATRIC DISORDERS Anxiety     negative neurological ROS     GI/Hepatic negative GI ROS, Neg liver ROS,,,  Endo/Other  negative endocrine ROS    Renal/GU      Musculoskeletal   Abdominal   Peds  Hematology  (+) Blood dyscrasia, anemia   Anesthesia Other Findings Past Medical History: No date: Anemia No date: Anxiety 10/27/2018: History of COVID-19     Comment:  Tested + on 10/17/18  Hospitalized [facility] for SOB               11/01/18-11/03/18 Solumedrol + antibiotics forf SOB  Finished              with isolation 11/07/18     07/11/2023: Subchorionic hemorrhage in first trimester     Comment:  Seen on dating US  07/08/23.  08/19/23 US : Placental lakes               noted. No mention of subchorionic hemorrhage.     Past Surgical History: No date: DILATION AND CURETTAGE OF UTERUS 02/09/2015: DILATION AND EVACUATION; N/A     Comment:  Procedure: DILATATION AND EVACUATION;  Surgeon: Heather Penton, MD;  Location: [facility] ORS;  Service: Gynecology;                Laterality: N/A; 02/09/2015: LAPAROSCOPY     Comment:  Procedure: LAPAROSCOPY DIAGNOSTIC;  Surgeon: Heather Penton, MD;  Location: [facility] ORS;  Service: Gynecology;; No date: NO PAST SURGERIES  BMI    Body Mass Index: 33.67 kg/m      Reproductive/Obstetrics                               Anesthesia Physical Anesthesia Plan  ASA: 2  Anesthesia Plan: General ETT   Post-op Pain Management:    Induction: Intravenous  PONV Risk Score and Plan: 3 and Ondansetron , Dexamethasone  and Midazolam   Airway Management Planned: Oral ETT  Additional Equipment:   Intra-op Plan:   Post-operative Plan: Extubation in OR  Informed Consent: I have reviewed the patients History and Physical, chart, labs and discussed the procedure including the risks, benefits and alternatives for the proposed anesthesia with the patient or authorized representative who has indicated his/her understanding and acceptance.     Dental Advisory Given  Plan Discussed with: Anesthesiologist, CRNA and Surgeon  Anesthesia Plan Comments: (Patient consented for risks of anesthesia including but not limited to:  - adverse reactions to medications - damage to eyes, teeth, lips or other oral mucosa - nerve damage due to positioning  - sore throat or hoarseness - Damage to heart, brain, nerves, lungs, other parts of body or loss of life  Patient voiced understanding and assent.)  Anesthesia Quick Evaluation

## 2024-01-21 NOTE — Discharge Summary (Signed)
 Postpartum Discharge Summary  Date of Service updated: 01/22/2024     Patient Name: Jeanne [name] DOB: Feb 25, 1991 MRN: [id number]  Date of admission: 01/21/2024 Delivery date:01/21/2024 Delivering provider: [facility], JINNIE JANSKY, CNM Date of discharge: 01/22/2024  Admitting diagnosis: Labor and delivery, indication for care [O75.9] Intrauterine pregnancy: [redacted]w[redacted]d     Secondary diagnosis:  Principal Problem:   Labor and delivery, indication for care Active Problems:   Postpartum hemorrhage, delivered   Status post tubal ligation   Encounter for care or examination of lactating mother   Postpartum care following vaginal delivery  Additional problems: PPH    Discharge diagnosis: Term Pregnancy Delivered                                              Post partum procedures:postpartum tubal ligation Augmentation: [facility] Complications: None  Hospital course: Onset of Labor With Vaginal Delivery      33 y.o. yo [id number] at [redacted]w[redacted]d was admitted in Latent Labor on 01/21/2024. Labor course was complicated byNA  Membrane Rupture Time/Date: 10:03 AM,01/21/2024  Delivery Method:Vaginal, Spontaneous Operative Delivery:N/A Episiotomy: None Lacerations:  1st degree;Perineal See delivery note for details  Patient had a postpartum course complicated by postpartum hemorrhage. Jada placed, bleeding stabilized. Patient had laparoscopic bilateral tubal ligation on the morning of postpartum day 1. Some urinary retention following surgery which is improving by time of discharge. Output is good. She is tolerating regular diet, pain is controlled with PO medication, ambulating and voiding without difficulty. She reports breastfeeding is going well.   Patient is discharged home in stable condition on 01/22/24.  Newborn Data: Birth date:01/21/2024 Birth time:10:44 AM Gender:Female Living status:Living Apgars:8 ,9  Weight:3780 g  Magnesium Sulfate received: No BMZ received:  No Rhophylac:N/A MMR:N/A T-DaP:Given prenatally Flu: Yes RSV Vaccine received: Yes Transfusion:No Immunizations administered: Immunization History  Administered Date(s) Administered    sv, Bivalent, Protein Subunit Rsvpref,pf Marlow) 12/24/2023   Fluzone Influenza virus vaccine,trivalent (IIV3), split virus 12/01/2014   Influenza, Seasonal, Injecte, Preservative Fre 12/06/2023   Influenza,inj,Quad PF,6+ Mos 12/24/2018   Tdap 10/27/2012, 11/12/2018, 10/22/2023    Physical exam  Vitals:   01/22/24 0927 01/22/24 1013 01/22/24 1140 01/22/24 1540  BP: 128/83 120/73 120/77 120/69  Pulse: (!) 54 (!) 56 61 72  Resp: 16 17 16 16   Temp: 98.3 F (36.8 C) 98.6 F (37 C) 98.3 F (36.8 C) 98.3 F (36.8 C)  TempSrc:      SpO2: 98% 98% 98% 97%  Weight:      Height:       General: alert, cooperative, and no distress Lochia: appropriate Uterine Fundus: firm at U Incision: Dressing is clean, dry, and intact DVT Evaluation: No evidence of DVT seen on physical exam. Labs: Lab Results  Component Value Date   WBC 8.0 01/22/2024   HGB 9.8 (L) 01/22/2024   HCT 29.8 (L) 01/22/2024   MCV 91.1 01/22/2024   PLT 102 (L) 01/22/2024      Latest Ref Rng & Units 01/21/2024    3:32 PM  CMP  Glucose 70 - 99 mg/dL 84   BUN 6 - 20 mg/dL 4   Creatinine 9.55 - 8.99 mg/dL 9.49   Sodium 864 - 854 mmol/L 135   Potassium 3.5 - 5.1 mmol/L 4.1   Chloride 98 - 111 mmol/L 105    Edinburgh Score:  01/22/2024   12:15 PM  Edinburgh Postnatal Depression Scale Screening Tool  I have been able to laugh and see the funny side of things. 0  I have looked forward with enjoyment to things. 2  I have blamed myself unnecessarily when things went wrong. 1  I have been anxious or worried for no good reason. 0  I have felt scared or panicky for no good reason. 1  Things have been getting on top of me. 1  I have been so unhappy that I have had difficulty sleeping. 2  I have felt sad or miserable. 0  I  have been so unhappy that I have been crying. 1  The thought of harming myself has occurred to me. 0  Edinburgh Postnatal Depression Scale Total 8      After visit meds:  Allergies as of 01/22/2024   No Known Allergies      Medication List     STOP taking these medications    albuterol  108 (90 Base) MCG/ACT inhaler Commonly known as: VENTOLIN  HFA       TAKE these medications    acetaminophen  500 MG tablet Commonly known as: TYLENOL  Take 2 tablets (1,000 mg total) by mouth every 6 (six) hours as needed. What changed: reasons to take this   clobetasol  ointment 0.05 % Commonly known as: TEMOVATE  Apply 1 gram topically to affected area of skin twice daily. Stop once resolved and restart as needed for flares. Avoid use on face, armpits, groin unless otherwise indicated.   ferrous sulfate  325 (65 FE) MG EC tablet Take 1 tablet (325 mg total) by mouth every other day.   ibuprofen  600 MG tablet Commonly known as: ADVIL  Take 1 tablet (600 mg total) by mouth every 6 (six) hours as needed.   multivitamin-prenatal 27-0.8 MG Tabs tablet Take 1 tablet by mouth daily at 12 noon.   nystatin  cream Commonly known as: MYCOSTATIN  Apply 1 Application topically 2 (two) times daily.   triamcinolone  cream 0.1 % Commonly known as: KENALOG  Apply 1 Application topically 2 (two) times daily.         Discharge home in stable condition Infant Feeding: Breast and Formula Infant Disposition:home with mother Discharge instruction: per After Visit Summary and Postpartum booklet. Activity: Advance as tolerated. Pelvic rest for 6 weeks.  Diet: routine diet Anticipated Birth Control: BTL done PP Postpartum Appointment:2 weeks at [facility] Additional Postpartum F/U: 6 weeks at [facility]  Future Appointments:No future appointments. Follow up Visit:  Follow-up Information     [facility] DEPT Follow up in 2 week(s).   Why: 2wk mood and incision check , 6wk PP visit Contact  information: [address] [place]  [address] [phone]                 01/22/2024 Slater Rains, CNM

## 2024-01-21 NOTE — Progress Notes (Signed)
 Patient ID: Jeanne [name], female   DOB: October 09, 1990, 33 y.o.   MRN: [id number] Spoke to the patient and husband about her desire for sterilization . No significant pelvic infections . PPH today with hemoglobin 10 1.5 hr post .  Will check HCT in am and if stable will proceed with PP BTL . Risks of the procedure discussed with her . Failure from BTL 1/300 / yr . All questions answered. NPO 2330 tonight

## 2024-01-21 NOTE — H&P (Signed)
 [facility] Labor & Delivery  History and Physical  ASSESSMENT AND PLAN   Jeanne [name] is a 33 y.o. [id number] at [redacted]w[redacted]d with EDD: 01/18/2024, by Last Menstrual Period admitted for labor management.  1. Admit to L&D 2. EFM: Continuous-- Category 2 3. Admission labs  4. Pharmacologic pain relief if desired. 5. Augmentation if indicated 6. Anticipate NSVD 7. Dr. Starla notified of admission    Fetal Status: - cephalic presentation by US  12/27/23 and Leopold's - EFW: 8.5 by Leopold's - FHT currently category 2  Labs/Immunizations: Prenatal labs and studies: ABO, Rh: --/--/PENDING (11/25 0440) Antibody: PENDING (11/25 0440) Rubella: 4.55 (05/06 1455) Varicella:    RPR: Non Reactive (08/28 1527)  HBsAg: Negative (05/06 1455)  HepC: Non Reactive (05/06 1455) HIV: Non Reactive (05/06 1455)  GBS: Negative/-- (10/28 1642)   TDAP: yes Flu: yes RSV: yes  Postpartum Plan: - Feeding: Breast Milk and Formula - Contraception: plans Postpartum BTL - Prenatal Care Provider: [facility]     HPI   Chief Complaint: Contractions  Jeanne [name] is a 33 y.o. [phone] at [redacted]w[redacted]d who presents for contractions that started on Sunday at 0100. They have been on and off, and then became more regular at 0430 on Monday. She has not had much sleep over the past few days. She endorses good fetal movement. She has had a small amount of bloody show. She denies LOF. Her pregnancy has been complicated by anemia and a skin rash. Occasional variable decels noted on FHR tracing. Recommend augmentation if labor does not progress spontaneously, and Pedro is in agreement.    Pregnancy Complications Patient Active Problem List   Diagnosis Date Noted   Labor and delivery, indication for care 01/21/2024   Pruritic dermatitis 01/09/2024   Candidal intertrigo 12/06/2023   Iron deficiency anemia of pregnancy 10/25/2023   Supervision of other normal pregnancy, antepartum  07/02/2023   Adjustment disorder 09/09/2018    Review of Systems A twelve point review of systems was negative except as stated in HPI.   HISTORY   Medications Medications Prior to Admission  Medication Sig Dispense Refill Last Dose/Taking   clobetasol  ointment (TEMOVATE ) 0.05 % Apply 1 gram topically to affected area of skin twice daily. Stop once resolved and restart as needed for flares. Avoid use on face, armpits, groin unless otherwise indicated. 60 g 5 01/20/2024   ferrous sulfate  325 (65 FE) MG EC tablet Take 1 tablet (325 mg total) by mouth every other day. 90 tablet 3 01/20/2024 Morning   nystatin  cream (MYCOSTATIN ) Apply 1 Application topically 2 (two) times daily. 30 g 0 01/20/2024 Morning   Prenatal Vit-Fe Fumarate-FA (MULTIVITAMIN-PRENATAL) 27-0.8 MG TABS tablet Take 1 tablet by mouth daily at 12 noon.   01/20/2024 Morning   acetaminophen  (TYLENOL ) 500 MG tablet Take 2 tablets (1,000 mg total) by mouth every 6 (six) hours as needed for mild pain or moderate pain. (Patient not taking: Reported on 01/17/2024) 60 tablet 0    albuterol  (VENTOLIN  HFA) 108 (90 Base) MCG/ACT inhaler Inhale 2 puffs into the lungs every 6 (six) hours as needed for wheezing or shortness of breath. (Patient not taking: Reported on 01/17/2024) 18 g 0    triamcinolone  cream (KENALOG ) 0.1 % Apply 1 Application topically 2 (two) times daily. 30 g 0     Allergies has no known allergies.   OB History OB History  Gravida Para Term Preterm AB Living  5 3 3  0 1 3  SAB  IAB Ectopic Multiple Live Births  1 0 0 0 2    # Outcome Date GA Lbr Len/2nd Weight Sex Type Anes PTL Lv  5 Current           4 Term 01/31/19 [redacted]w[redacted]d 08:38 / 00:20 3760 g M Vag-Spont None  LIV     Name: LOPEZ TORRES,BOY Marcena     Apgar1: 8  Apgar5: 9  3 Term 10/17/16 [redacted]w[redacted]d 15:16 / 00:24 3880 g M Vag-Spont None       Name: WILFRED IRVEN HOPKINS Xee     Apgar1: 7  Apgar5: 8  2 SAB 2016 [redacted]w[redacted]d       FD  1 Term 01/06/13 [redacted]w[redacted]d  3771 g F  Vag-Vacuum   LIV     Complications: Postpartum hemorrhage    Past Medical History Past Medical History:  Diagnosis Date   Anemia    Anxiety    History of COVID-19 10/27/2018   Tested + on 10/17/18  Hospitalized [facility] for SOB 11/01/18-11/03/18 Solumedrol + antibiotics forf SOB  Finished with isolation 11/07/18        Subchorionic hemorrhage in first trimester 07/11/2023   Seen on dating US  07/08/23.  08/19/23 US : Placental lakes noted. No mention of subchorionic hemorrhage.       Past Surgical History Past Surgical History:  Procedure Laterality Date   DILATION AND CURETTAGE OF UTERUS     DILATION AND EVACUATION N/A 02/09/2015   Procedure: DILATATION AND EVACUATION;  Surgeon: Heather Penton, MD;  Location: [facility] ORS;  Service: Gynecology;  Laterality: N/A;   LAPAROSCOPY  02/09/2015   Procedure: LAPAROSCOPY DIAGNOSTIC;  Surgeon: Heather Penton, MD;  Location: [facility] ORS;  Service: Gynecology;;   NO PAST SURGERIES      Social History  reports that she has never smoked. She has never been exposed to tobacco smoke. She has never used smokeless tobacco. She reports that she does not currently use alcohol. She reports that she does not use drugs.   Family History family history is not on file.   PHYSICAL EXAM   Vitals:   01/21/24 0346  BP: 131/69  Pulse: (!) 53  Resp: 20  Temp: 98.1 F (36.7 C)  TempSrc: Oral  Weight: 78.2 kg  Height: 5' (1.524 m)    Constitutional: No acute distress, well appearing, and well nourished. Neurologic: She is alert and conversational.  Psychiatric: She has a normal mood and affect.  Musculoskeletal: Normal gait, grossly normal range of motion Cardiovascular: Normal rate.   Pulmonary/Chest: Normal work of breathing.  Gastrointestinal/Abdominal: Soft. Gravid. There is no tenderness. Contractions Skin: Skin is warm and dry. Pruritus on arms Genitourinary: Normal external female genitalia.  SVE:   Dilation: 4 Effacement (%): 80 Cervical Position:  Posterior Station: -2 Presentation: Vertex Exam by:: T Chattin, RN   NST Interpretation  Baseline: 130 bpm Variability: moderate Accelerations: present Decelerations: early, variable Contractions: regular, every 2-6 minutes Impression: reactive    Eleanor CHRISTELLA Canny, CNM

## 2024-01-21 NOTE — Interval H&P Note (Signed)
 History and Physical Interval Note:  01/21/2024 3:36 PM  Jeanne [name]  has presented today now status post SVD with PPH, now stable for surgery, with the diagnosis of desires permanent sterilization.  The various methods of treatment have been discussed with the patient and family. After consideration of risks, benefits and other options for treatment, the patient has consented to  Procedure(s): LIGATION, FALLOPIAN TUBE, POSTPARTUM (Bilateral) as a surgical intervention.  The patient's history has been reviewed, patient examined, no change in status, stable for surgery.  I have reviewed the patient's chart and labs.  Questions were answered to the patient's satisfaction.  Patient counseled, r.e. Risks benefits of BTL, including permanency of procedure, discussed benefits of salpingectomy including almost no failure and prevention of ovarian cancer. Patient verbalized understanding and desires to proceed. Risks include but are not limited to bleeding, infection, injury to surrounding structures, including bowel, bladder and ureters, blood clots, and death.  Likelihood of success is high.     [name]

## 2024-01-21 NOTE — OB Triage Note (Signed)
 Patient G5P3 40.3 weeks presents to L&D for contractions that have been on and off since yesterday at 0400. SVE 4/80/-2. \  M. Swanson notified. Orders received to admit patient.

## 2024-01-21 NOTE — Progress Notes (Signed)
 Patient ID: Jeanne [name], female   DOB: 1991-02-23, 33 y.o.   MRN: [id number]  RN called to say that she has gotten dime sized clots plus one egg sized clots with fundal rubs, fundus had been firm, her most recent QBL was an additionally .   Pt resting quietly.  Fundus firm, above U and deviated to the left, large amount of urine expressed with bimanual exam, lower uterine segment noted to be boggy and medium sized clot expressed. Fundus midline and at U once urine and clot released.  800mcg Cytotec  placed PR  JADA placed per manufacturer's instructions. Pt tolerated procedure well  Foley catheter placed   QBL   Fundus firm, bleeding stable.  Dr Fredirick notified   Jeanne [name], CNM  [facility] 01/21/24 1:03 PM /

## 2024-01-22 ENCOUNTER — Telehealth: Payer: Self-pay

## 2024-01-22 ENCOUNTER — Encounter: Payer: Self-pay | Admitting: Obstetrics

## 2024-01-22 ENCOUNTER — Inpatient Hospital Stay: Admitting: Certified Registered"

## 2024-01-22 ENCOUNTER — Encounter
Admission: EM | Disposition: A | Discharge status: Home / Self Care | Payer: Self-pay | Source: Home / Self Care | Attending: Obstetrics

## 2024-01-22 DIAGNOSIS — Z9851 Tubal ligation status: Secondary | ICD-10-CM

## 2024-01-22 LAB — CBC
HCT: 29.8 % — ABNORMAL LOW (ref 36.0–46.0)
Hemoglobin: 9.8 g/dL — ABNORMAL LOW (ref 12.0–15.0)
MCH: 30 pg (ref 26.0–34.0)
MCHC: 32.9 g/dL (ref 30.0–36.0)
MCV: 91.1 fL (ref 80.0–100.0)
Platelets: 102 K/uL — ABNORMAL LOW (ref 150–400)
RBC: 3.27 MIL/uL — ABNORMAL LOW (ref 3.87–5.11)
RDW: 15.4 % (ref 11.5–15.5)
WBC: 8 K/uL (ref 4.0–10.5)
nRBC: 0 % (ref 0.0–0.2)

## 2024-01-22 SURGERY — LIGATION, FALLOPIAN TUBE, POSTPARTUM
Anesthesia: General | Site: Abdomen

## 2024-01-22 MED ORDER — GLYCOPYRROLATE 0.2 MG/ML IJ SOLN
INTRAMUSCULAR | Status: DC | PRN
  Administered 2024-01-22: .2 mg via INTRAVENOUS

## 2024-01-22 MED ORDER — OXYCODONE HCL 5 MG/5ML PO SOLN: 5.0000 mg | Freq: Once | ORAL | Status: AC | PRN

## 2024-01-22 MED ORDER — ACETAMINOPHEN 500 MG PO TABS: 1000.0000 mg | ORAL_TABLET | Freq: Four times a day (QID) | ORAL | 0 refills | Status: AC | PRN

## 2024-01-22 MED ORDER — BUPIVACAINE HCL 0.5 % IJ SOLN
INTRAMUSCULAR | Status: DC | PRN
Start: 2024-01-22 — End: 2024-01-22
  Administered 2024-01-22: 4 mL

## 2024-01-22 MED ORDER — ROCURONIUM BROMIDE 100 MG/10ML IV SOLN
INTRAVENOUS | Status: DC | PRN
  Administered 2024-01-22: 20 mg via INTRAVENOUS

## 2024-01-22 MED ORDER — SUCCINYLCHOLINE CHLORIDE 200 MG/10ML IV SOSY
PREFILLED_SYRINGE | INTRAVENOUS | Status: DC | PRN
  Administered 2024-01-22: 100 mg via INTRAVENOUS

## 2024-01-22 MED ORDER — PROPOFOL 1000 MG/100ML IV EMUL
INTRAVENOUS | Status: AC
  Filled 2024-01-22: qty 100

## 2024-01-22 MED ORDER — FENTANYL CITRATE (PF) 100 MCG/2ML IJ SOLN
INTRAMUSCULAR | Status: AC
  Filled 2024-01-22: qty 2

## 2024-01-22 MED ORDER — FENTANYL CITRATE (PF) 100 MCG/2ML IJ SOLN: 25.0000 ug | INTRAMUSCULAR | Status: DC | PRN

## 2024-01-22 MED ORDER — 0.9 % SODIUM CHLORIDE (POUR BTL) OPTIME
TOPICAL | Status: DC | PRN
  Administered 2024-01-22: 500 mL

## 2024-01-22 MED ORDER — IBUPROFEN 600 MG PO TABS: 600.0000 mg | ORAL_TABLET | Freq: Four times a day (QID) | ORAL | 0 refills | Status: AC | PRN

## 2024-01-22 MED ORDER — PHENYLEPHRINE 80 MCG/ML (10ML) SYRINGE FOR IV PUSH (FOR BLOOD PRESSURE SUPPORT)
PREFILLED_SYRINGE | INTRAVENOUS | Status: DC | PRN
  Administered 2024-01-22: 160 ug via INTRAVENOUS

## 2024-01-22 MED ORDER — LIDOCAINE HCL (CARDIAC) PF 100 MG/5ML IV SOSY
PREFILLED_SYRINGE | INTRAVENOUS | Status: DC | PRN
  Administered 2024-01-22: 100 mg via INTRAVENOUS

## 2024-01-22 MED ORDER — CEFAZOLIN SODIUM-DEXTROSE 2-4 GM/100ML-% IV SOLN
INTRAVENOUS | Status: AC
  Filled 2024-01-22: qty 100

## 2024-01-22 MED ORDER — OXYCODONE HCL 5 MG PO TABS
ORAL_TABLET | ORAL | Status: AC
  Filled 2024-01-22: qty 1

## 2024-01-22 MED ORDER — SUGAMMADEX SODIUM 200 MG/2ML IV SOLN
INTRAVENOUS | Status: DC | PRN
  Administered 2024-01-22: 200 mg via INTRAVENOUS

## 2024-01-22 MED ORDER — KETOROLAC TROMETHAMINE 30 MG/ML IJ SOLN
INTRAMUSCULAR | Status: DC | PRN
  Administered 2024-01-22: 30 mg via INTRAVENOUS

## 2024-01-22 MED ORDER — BUPIVACAINE HCL (PF) 0.5 % IJ SOLN
INTRAMUSCULAR | Status: AC
Start: 2024-01-22 — End: 2024-01-22
  Filled 2024-01-22: qty 30

## 2024-01-22 MED ORDER — EPHEDRINE SULFATE-NACL 50-0.9 MG/10ML-% IV SOSY
PREFILLED_SYRINGE | INTRAVENOUS | Status: DC | PRN
Start: 2024-01-22 — End: 2024-01-22
  Administered 2024-01-22: 10 mg via INTRAVENOUS

## 2024-01-22 MED ORDER — PROPOFOL 10 MG/ML IV BOLUS
INTRAVENOUS | Status: DC | PRN
  Administered 2024-01-22: 50 mg via INTRAVENOUS
  Administered 2024-01-22: 150 mg via INTRAVENOUS

## 2024-01-22 MED ORDER — OXYCODONE HCL 5 MG PO TABS
5.0000 mg | ORAL_TABLET | Freq: Once | ORAL | Status: AC | PRN
  Administered 2024-01-22: 5 mg via ORAL

## 2024-01-22 MED ORDER — DEXAMETHASONE SOD PHOSPHATE PF 10 MG/ML IJ SOLN
INTRAMUSCULAR | Status: DC | PRN
  Administered 2024-01-22: 10 mg via INTRAVENOUS

## 2024-01-22 MED ORDER — FENTANYL CITRATE (PF) 100 MCG/2ML IJ SOLN
INTRAMUSCULAR | Status: DC | PRN
  Administered 2024-01-22: 100 ug via INTRAVENOUS

## 2024-01-22 SURGICAL SUPPLY — 31 items
APPLICATOR COTTON TIP 6 STRL (MISCELLANEOUS) ×1 IMPLANT
BENZOIN TINCTURE PRP APPL 2/3 (GAUZE/BANDAGES/DRESSINGS) IMPLANT
BLADE SURG SZ11 CARB STEEL (BLADE) ×1 IMPLANT
CHLORAPREP W/TINT 26 (MISCELLANEOUS) ×1 IMPLANT
DERMABOND ADVANCED .7 DNX12 (GAUZE/BANDAGES/DRESSINGS) ×1 IMPLANT
DRAPE LAPAROTOMY 77X122 PED (DRAPES) ×1 IMPLANT
DRSG TEGADERM 2-3/8X2-3/4 SM (GAUZE/BANDAGES/DRESSINGS) IMPLANT
DRSG TEGADERM 4X4.75 (GAUZE/BANDAGES/DRESSINGS) IMPLANT
ELECT CAUTERY BLADE 6.4 (BLADE) ×1 IMPLANT
ELECTRODE REM PT RTRN 9FT ADLT (ELECTROSURGICAL) ×1 IMPLANT
GAUZE 4X4 16PLY ~~LOC~~+RFID DBL (SPONGE) ×1 IMPLANT
GAUZE SPONGE 2X2 STRL 8-PLY (GAUZE/BANDAGES/DRESSINGS) ×1 IMPLANT
GLOVE SURG SYN 8.0 PF PI (GLOVE) ×1 IMPLANT
GOWN STRL REUS W/ TWL LRG LVL3 (GOWN DISPOSABLE) ×1 IMPLANT
GOWN STRL REUS W/ TWL XL LVL3 (GOWN DISPOSABLE) ×1 IMPLANT
KIT TURNOVER KIT A (KITS) ×1 IMPLANT
LABEL OR SOLS (LABEL) ×1 IMPLANT
MANIFOLD NEPTUNE II (INSTRUMENTS) ×1 IMPLANT
NDL HYPO 22X1.5 SAFETY MO (MISCELLANEOUS) ×1 IMPLANT
NS IRRIG 500ML POUR BTL (IV SOLUTION) ×1 IMPLANT
PACK BASIN MINOR ARMC (MISCELLANEOUS) ×1 IMPLANT
SCRUB CHG 4% DYNA-HEX 4OZ (MISCELLANEOUS) ×1 IMPLANT
SOLN 0.9% NACL POUR BTL 500 ML (IV SOLUTION) IMPLANT
SOLN STERILE WATER 500 ML (IV SOLUTION) ×1 IMPLANT
SPONGE VERSALON 4X4 4PLY (MISCELLANEOUS) IMPLANT
STRIP CLOSURE SKIN 1/4X4 (GAUZE/BANDAGES/DRESSINGS) IMPLANT
SUT PLAIN GUT 0 (SUTURE) ×2 IMPLANT
SUT VIC AB 2-0 UR6 27 (SUTURE) ×1 IMPLANT
SUT VIC AB 4-0 SH 27XANBCTRL (SUTURE) ×1 IMPLANT
SYR 10ML LL (SYRINGE) ×1 IMPLANT
TRAP FLUID SMOKE EVACUATOR (MISCELLANEOUS) ×1 IMPLANT

## 2024-01-22 NOTE — Anesthesia Procedure Notes (Addendum)
 Procedure Name: Intubation Date/Time: 01/22/2024 7:45 AM  Performed by: Norleen Alberta HERO., CRNAPre-anesthesia Checklist: Patient identified, Patient being monitored, Timeout performed, Emergency Drugs available and Suction available Patient Re-evaluated:Patient Re-evaluated prior to induction Oxygen Delivery Method: Circle system utilized Preoxygenation: Pre-oxygenation with 100% oxygen Induction Type: IV induction and Rapid sequence Laryngoscope Size: 3 and McGrath Grade View: Grade I Tube type: Oral Tube size: 7.0 mm Number of attempts: 1 Airway Equipment and Method: Stylet Placement Confirmation: ETT inserted through vocal cords under direct vision, positive ETCO2 and breath sounds checked- equal and bilateral Secured at: 20 cm Tube secured with: Tape Dental Injury: Teeth and Oropharynx as per pre-operative assessment

## 2024-01-22 NOTE — Anesthesia Preprocedure Evaluation (Signed)
 Anesthesia Evaluation  Patient identified by MRN, date of birth, ID band Patient awake    Reviewed: Allergy & Precautions, NPO status , Patient's Chart, lab work & pertinent test results  History of Anesthesia Complications Negative for: history of anesthetic complications  Airway Mallampati: IV  TM Distance: >3 FB Neck ROM: full    Dental no notable dental hx.    Pulmonary neg pulmonary ROS   Pulmonary exam normal        Cardiovascular negative cardio ROS Normal cardiovascular exam     Neuro/Psych negative neurological ROS  negative psych ROS   GI/Hepatic negative GI ROS, Neg liver ROS,,,  Endo/Other  negative endocrine ROS    Renal/GU      Musculoskeletal   Abdominal   Peds  Hematology  (+) Blood dyscrasia, anemia   Anesthesia Other Findings Past Medical History: No date: Anemia No date: Anxiety 10/27/2018: History of COVID-19     Comment:  Tested + on 10/17/18  Hospitalized [facility] for SOB               11/01/18-11/03/18 Solumedrol + antibiotics forf SOB  Finished              with isolation 11/07/18      07/11/2023: Subchorionic hemorrhage in first trimester     Comment:  Seen on dating US  07/08/23.  08/19/23 US : Placental lakes               noted. No mention of subchorionic hemorrhage.     Past Surgical History: No date: DILATION AND CURETTAGE OF UTERUS 02/09/2015: DILATION AND EVACUATION; N/A     Comment:  Procedure: DILATATION AND EVACUATION;  Surgeon: Heather Penton, MD;  Location: [facility] ORS;  Service: Gynecology;                Laterality: N/A; 02/09/2015: LAPAROSCOPY     Comment:  Procedure: LAPAROSCOPY DIAGNOSTIC;  Surgeon: Heather Penton, MD;  Location: [facility] ORS;  Service: Gynecology;; No date: NO PAST SURGERIES  BMI    Body Mass Index: 33.67 kg/m      Reproductive/Obstetrics (+) Breast feeding  PPD 1                               Anesthesia Physical Anesthesia Plan  ASA: 2  Anesthesia Plan: General LMA   Post-op Pain Management: Minimal or no pain anticipated   Induction: Intravenous  PONV Risk Score and Plan: 3 and Dexamethasone , Ondansetron , Midazolam  and Treatment may vary due to age or medical condition  Airway Management Planned: LMA  Additional Equipment:   Intra-op Plan:   Post-operative Plan: Extubation in OR  Informed Consent: I have reviewed the patients History and Physical, chart, labs and discussed the procedure including the risks, benefits and alternatives for the proposed anesthesia with the patient or authorized representative who has indicated his/her understanding and acceptance.     Dental Advisory Given  Plan Discussed with: Anesthesiologist, CRNA and Surgeon  Anesthesia Plan Comments: (Patient consented for risks of anesthesia including but not limited to:  - adverse reactions to medications - damage to eyes, teeth, lips or other oral mucosa - nerve damage due to positioning  - sore throat or hoarseness - Damage to heart, brain, nerves, lungs, other parts of body or loss  of life  Patient voiced understanding and assent.)         Anesthesia Quick Evaluation

## 2024-01-22 NOTE — Transfer of Care (Signed)
 Immediate Anesthesia Transfer of Care Note  Patient: Jeanne [name]  Procedure(s) Performed: LIGATION, FALLOPIAN TUBE, POSTPARTUM (Abdomen)  Patient Location: PACU  Anesthesia Type:General  Level of Consciousness: drowsy and patient cooperative  Airway & Oxygen Therapy: Patient Spontanous Breathing  Post-op Assessment: Report given to RN and Post -op Vital signs reviewed and stable  Post vital signs: stable  Last Vitals:  Vitals Value Taken Time  BP 128/83 01/22/24 09:27  Temp 36.8 C 01/22/24 09:27  Pulse 54 01/22/24 09:27  Resp 16 01/22/24 09:27  SpO2 98 % 01/22/24 09:27    Last Pain:  Vitals:   01/22/24 0850  TempSrc:   PainSc: 4          Complications: There were no known notable events for this encounter.

## 2024-01-22 NOTE — Telephone Encounter (Signed)
 Call received from Tonya at [facility] requesting sterilization consent that client signed be faxed to [facility] for MD to sign. [facility] provider delivered infant and [facility] MD performed sterilization operation. Consent form faxed to [facility] with fax confirmation received. Burnadette Lowers, RN

## 2024-01-22 NOTE — Op Note (Unsigned)
 NAMEJABREE, Jeanne [name] MEDICAL RECORD NO: [id number] ACCOUNT NO: [id number] DATE OF BIRTH: 09-May-1990 FACILITY: [facility] LOCATION: [facility] PHYSICIAN: Debby DOROTHA Dinsmore, MD  Operative Report   DATE OF PROCEDURE: 01/22/2024  PREOPERATIVE DIAGNOSIS:  Elective postpartum sterilization.  POSTOPERATIVE DIAGNOSIS:  Elective postpartum sterilization.  PROCEDURE:  Bilateral tubal ligation, [place].  ANESTHESIA:  General endotracheal anesthesia.  SURGEON:  Debby DOROTHA Dinsmore, MD  INDICATIONS:  The patient is a 33 year old gravida 5, now para 4, status post uncomplicated spontaneous vaginal delivery the day previous.  The patient has elected for permanent sterilization.  The patient has been counseled regarding the failure of 1  per 300 per year.  PROCEDURE IN DETAIL:  After adequate general endotracheal anesthesia, the patient was placed in dorsal supine position.  The patient's abdomen was prepped and draped in normal sterile fashion.  Time out was performed.  A 15-mm infraumbilical incision was  made after injecting with 0.5% Marcaine .  The fascia and the peritoneum were opened sharply.  Attention was directed to the patient's right uterine side where the fallopian tube was identified, grasped with a Babcock clamp, and the fimbriated end was  visualized.  Two separate 0 plain gut sutures were placed in the mid portion of the fallopian tube and a 1.5 cm portion of fallopian tube was removed.  Good hemostasis noted.  A similar procedure was repeated on the patient's left fallopian tube, again  after identifying the fallopian tube and the fimbriated end, two separate 0 plain gut sutures were placed at the mid portion of the fallopian tube and a 1.5 cm portion of fallopian tube removed.  Good hemostasis noted.  The fascia was then closed with a  running 2-0 Vicryl suture and the skin was reapproximated with interrupted 4-0 Vicryl suture.  Sterile dressing applied.  There were no  complications.  ESTIMATED BLOOD LOSS:  3 mL.  INTRAOPERATIVE FLUIDS:  300 mL.  DISPOSITION:  The patient tolerated the procedure well and was taken to recovery room in good condition.  She did receive 30 mg intravenous Toradol  at the end of the procedure.    MUK D: 01/22/2024 8:27:57 am T: 01/22/2024 8:33:00 am  JOB: [id number]/ [id number]

## 2024-01-22 NOTE — Progress Notes (Signed)
 [facility]  Subjective:  Patient is back in room following laparoscopic BTL this morning. She is tolerating regular diet, pain is controlled with PO medication, ambulating and voiding without difficulty. Bleeding is minimal. She has had some urinary retention since BTL. Good output overall. Will continue to assess prior to discharge. She is requesting discharge today. She reports breast and formula feeding.  Objective:  Vital signs in last 24 hours: Temp:  [97.3 F (36.3 C)-99.5 F (37.5 C)] 98.6 F (37 C) (11/26 1013) Pulse Rate:  [53-96] 56 (11/26 1013) Resp:  [11-20] 17 (11/26 1013) BP: (79-128)/(24-83) 120/73 (11/26 1013) SpO2:  [97 %-100 %] 98 % (11/26 1013) Weight:  [78.2 kg] 78.2 kg (11/26 0658)    General: NAD Pulmonary: no increased work of breathing Abdomen: non-distended, non-tender, fundus firm at level of umbilicus Extremities: no edema, no erythema, no tenderness  Results for orders placed or performed during the hospital encounter of 01/21/24 (from the past 72 hours)  CBC     Status: Abnormal   Collection Time: 01/21/24  4:40 AM  Result Value Ref Range   WBC 7.1 4.0 - 10.5 K/uL   RBC 4.46 3.87 - 5.11 MIL/uL   Hemoglobin 13.3 12.0 - 15.0 g/dL   HCT 59.7 63.9 - 53.9 %   MCV 90.1 80.0 - 100.0 fL   MCH 29.8 26.0 - 34.0 pg   MCHC 33.1 30.0 - 36.0 g/dL   RDW 84.7 88.4 - 84.4 %   Platelets 101 (L) 150 - 400 K/uL    Comment: Immature Platelet Fraction may be clinically indicated, consider ordering this additional test [id number]    nRBC 0.7 (H) 0.0 - 0.2 %    Comment: Performed at [facility], [address] [place], [place] [address]  Type and screen [facility]     Status: None   Collection Time: 01/21/24  4:40 AM  Result Value Ref Range   ABO/RH(D) O POS    Antibody Screen NEG    Sample Expiration      01/24/2024,2359 Performed at [facility], [address] [place], [place] [address]   RPR     Status:  None   Collection Time: 01/21/24  4:40 AM  Result Value Ref Range   RPR Ser Ql NON REACTIVE NON REACTIVE    Comment: Performed at [facility], [address], [place], [place] [address]  I-STAT, nathanael 8     Status: Abnormal   Collection Time: 01/21/24  3:32 PM  Result Value Ref Range   Sodium 135 135 - 145 mmol/L   Potassium 4.1 3.5 - 5.1 mmol/L   Chloride 105 98 - 111 mmol/L   BUN 4 (L) 6 - 20 mg/dL   Creatinine, Ser 9.49 0.44 - 1.00 mg/dL   Glucose, Bld 84 70 - 99 mg/dL    Comment: Glucose reference range applies only to samples taken after fasting for at least 8 hours.   Calcium, Ion 1.12 (L) 1.15 - 1.40 mmol/L   TCO2 20 (L) 22 - 32 mmol/L   Hemoglobin 10.5 (L) 12.0 - 15.0 g/dL   HCT 68.9 (L) 63.9 - 53.9 %  CBC with Differential/Platelet     Status: Abnormal   Collection Time: 01/21/24  7:34 PM  Result Value Ref Range   WBC 10.7 (H) 4.0 - 10.5 K/uL   RBC 3.39 (L) 3.87 - 5.11 MIL/uL   Hemoglobin 10.2 (L) 12.0 - 15.0 g/dL   HCT 69.7 (L)  36.0 - 46.0 %   MCV 89.1 80.0 - 100.0 fL   MCH 30.1 26.0 - 34.0 pg   MCHC 33.8 30.0 - 36.0 g/dL   RDW 84.6 88.4 - 84.4 %   Platelets 102 (L) 150 - 400 K/uL   nRBC 0.0 0.0 - 0.2 %   Neutrophils Relative % 79 %   Neutro Abs 8.4 (H) 1.7 - 7.7 K/uL   Lymphocytes Relative 14 %   Lymphs Abs 1.5 0.7 - 4.0 K/uL   Monocytes Relative 4 %   Monocytes Absolute 0.4 0.1 - 1.0 K/uL   Eosinophils Relative 2 %   Eosinophils Absolute 0.2 0.0 - 0.5 K/uL   Basophils Relative 0 %   Basophils Absolute 0.0 0.0 - 0.1 K/uL   Immature Granulocytes 1 %   Abs Immature Granulocytes 0.09 (H) 0.00 - 0.07 K/uL    Comment: Performed at [facility], [address] [place], [place] [address]  CBC     Status: Abnormal   Collection Time: 01/22/24  6:39 AM  Result Value Ref Range   WBC 8.0 4.0 - 10.5 K/uL   RBC 3.27 (L) 3.87 - 5.11 MIL/uL   Hemoglobin 9.8 (L) 12.0 - 15.0 g/dL   HCT 70.1 (L) 63.9 - 53.9 %   MCV 91.1 80.0 - 100.0 fL   MCH 30.0 26.0 -  34.0 pg   MCHC 32.9 30.0 - 36.0 g/dL   RDW 84.5 88.4 - 84.4 %   Platelets 102 (L) 150 - 400 K/uL   nRBC 0.0 0.0 - 0.2 %    Comment: Performed at [facility], [address] [place], [place] [address]    Assessment:   33 y.o. [id number] postpartum day # 1, s/p laparoscopic BTL, lactating  Plan:    1) Acute blood loss anemia - hemodynamically stable and asymptomatic Not clinically significant for this admission - po ferrous sulfate   2) Blood Type --/--/O POS (11/25 0440) / Rubella 4.55 (05/06 1455) / Varicella Immune  3) TDAP status : given antepartum  4) Feeding plan : breast and formula  5)  Contraception: s/p BTL  6) Disposition: discharge pending later today   Slater Rains, CNM [facility] 01/22/2024 11:33 AM

## 2024-01-22 NOTE — Progress Notes (Signed)
 Pt here for PP BTL . Labs reviewed . All questions answered . Proceed

## 2024-01-22 NOTE — Anesthesia Postprocedure Evaluation (Signed)
 Anesthesia Post Note  Patient: Jeanne [name]  Procedure(s) Performed: LIGATION, FALLOPIAN TUBE, POSTPARTUM (Abdomen)  Patient location during evaluation: PACU Anesthesia Type: General Level of consciousness: awake and alert Pain management: pain level controlled Vital Signs Assessment: post-procedure vital signs reviewed and stable Respiratory status: spontaneous breathing, nonlabored ventilation, respiratory function stable and patient connected to nasal cannula oxygen Cardiovascular status: blood pressure returned to baseline and stable Postop Assessment: no apparent nausea or vomiting Anesthetic complications: no   There were no known notable events for this encounter.   Last Vitals:  Vitals:   01/22/24 0927 01/22/24 1013  BP: 128/83 120/73  Pulse: (!) 54 (!) 56  Resp: 16 17  Temp: 36.8 C 37 C  SpO2: 98% 98%    Last Pain:  Vitals:   01/22/24 0915  TempSrc:   PainSc: 4                  [name]

## 2024-01-22 NOTE — Progress Notes (Signed)
 Patient discharged home with family. Discharge instructions, when to follow up, and prescriptions reviewed with patient. Patient verbalized understanding. Patient will be escorted out by auxiliary.

## 2024-01-22 NOTE — Brief Op Note (Signed)
 01/21/2024 - 01/22/2024  8:20 AM  PATIENT:  Jeanne [name]  33 y.o. female  PRE-OPERATIVE DIAGNOSIS:  Desire Sterilization  POST-OPERATIVE DIAGNOSIS:  Desire Sterilization  PROCEDURE:  Procedure(s): LIGATION, FALLOPIAN TUBE, POSTPARTUM (N/A) BTL  SURGEON:  Surgeons and Role:    * Dustee Bottenfield, Debby PARAS, MD - Primary  PHYSICIAN ASSISTANT: CST  ASSISTANTS: none   ANESTHESIA:   general  EBL:  ebl 3 cc, IOF 300cc   BLOOD ADMINISTERED:none  DRAINS: none   LOCAL MEDICATIONS USED:  MARCAINE      SPECIMEN:  Source of Specimen:  portion right and left fallopian tube   DISPOSITION OF SPECIMEN:  PATHOLOGY  COUNTS:  YES  TOURNIQUET:  * No tourniquets in log *  DICTATION: .Other Dictation: Dictation Number verbal  PLAN OF CARE: return to PP   PATIENT DISPOSITION:  PACU - hemodynamically stable.   Delay start of Pharmacological VTE agent (>24hrs) due to surgical blood loss or risk of bleeding: not applicable

## 2024-01-22 NOTE — Anesthesia Postprocedure Evaluation (Signed)
 Anesthesia Post Note  Patient: Jeanne [name]  Procedure(s) Performed: AN AD HOC LABOR EPIDURAL  Patient location during evaluation: Other Anesthesia Type: Epidural Level of consciousness: awake and alert Pain management: pain level controlled Vital Signs Assessment: post-procedure vital signs reviewed and stable Respiratory status: spontaneous breathing, nonlabored ventilation and respiratory function stable Cardiovascular status: stable Postop Assessment: no headache, no backache and epidural receding Anesthetic complications: no   No notable events documented.   Last Vitals:  Vitals:   01/21/24 2310 01/22/24 0658  BP: 115/72 (!) 109/59  Pulse: 65 (!) 54  Resp: 17 16  Temp: 37.1 C (!) 36.3 C  SpO2: 99% 97%    Last Pain:  Vitals:   01/22/24 0658  TempSrc: Temporal  PainSc: 0-No pain                 [name]

## 2024-01-22 NOTE — Progress Notes (Signed)
 Infant discharged home with parents.  Discharge instructions and appointments given to parents.  Parents verbalized understanding.  All testing complete. Tag removed, bands matched, car seat present.  Will be escorted by auxiliary.

## 2024-01-23 ENCOUNTER — Encounter: Payer: Self-pay | Admitting: Obstetrics and Gynecology

## 2024-01-24 LAB — SURGICAL PATHOLOGY

## 2024-01-27 ENCOUNTER — Ambulatory Visit

## 2024-03-09 NOTE — Progress Notes (Signed)
 Patient given Disability and Leave Service completed paperwork form (1 page). Front desk to inquire of needed postpartum appointment. B.Lukus Binion RN

## 2024-03-12 ENCOUNTER — Telehealth: Payer: Self-pay | Admitting: Family Medicine

## 2024-03-12 NOTE — Telephone Encounter (Signed)
 TC to patient who states she has to turn her paperwork in by Monday. Provider, HILARIO Bers will look over the paperwork tomorrow, 03/13/24, and make sure the paperwork is correct and then RN to call client to pick up the paperwork tomorrow. Patient states that is fine.Hulan Parish, RN

## 2024-03-19 NOTE — Telephone Encounter (Signed)
 Has PP appt scheduled for 03/20/24. Will review FMLA paperwork with patient at this appt. Niels Devonshire, RN

## 2024-03-20 ENCOUNTER — Ambulatory Visit: Payer: Self-pay | Admitting: Family Medicine

## 2024-03-20 ENCOUNTER — Ambulatory Visit

## 2024-03-20 VITALS — BP 109/61 | HR 72 | Wt 153.8 lb

## 2024-03-20 DIAGNOSIS — Z1331 Encounter for screening for depression: Secondary | ICD-10-CM | POA: Diagnosis not present

## 2024-03-20 DIAGNOSIS — Z3009 Encounter for other general counseling and advice on contraception: Secondary | ICD-10-CM | POA: Diagnosis not present

## 2024-03-20 LAB — HEMOGLOBIN, FINGERSTICK: Hemoglobin: 12.4 g/dL (ref 11.1–15.9)

## 2024-03-20 NOTE — Progress Notes (Signed)
 Here for PP visit. Gave birth 01/21/2024. Had PP BTL. Needs Hgb check today. Hulan Parish, RN

## 2024-03-20 NOTE — Progress Notes (Signed)
 " [facility] [address] [place] [address] Main phone: [phone]  Postpartum Visit  Subjective:  Jeanne [name] is a 34 y.o. [id number] female who presents for a postpartum visit. She is 11 week postpartum following a vacuum-assisted vaginal delivery.  I have fully reviewed the prenatal and intrapartum course. Postpartum course has complicated by PPH.   Patient Active Problem List   Diagnosis Date Noted   Postpartum hemorrhage, delivered 01/22/2024   Status post tubal ligation 01/22/2024   Encounter for care or examination of lactating mother 01/22/2024   Postpartum care following vaginal delivery 01/22/2024   Labor and delivery, indication for care 01/21/2024   Pruritic dermatitis 01/09/2024   Candidal intertrigo 12/06/2023   Iron deficiency anemia of pregnancy 10/25/2023   Supervision of other normal pregnancy, antepartum 07/02/2023   Adjustment disorder 09/09/2018   HPI: -Monday she had pain under her right breast. She denies erythema, hot to the touch or blood/foul discharge from breast. She reports taking ibuprofen  and she was fine the next day. She reports no with milk supply of breastfeeding. She additionally reports she stopped bleeding 2 weeks ago and started bleeding for 2-3 days, stopped for one day and started lightly bleeding again. Prior to pregnancy reports her periods were regular. She endorses constipation and has not tried to take medication for relief.    Delivery The delivery was at [redacted]w[redacted]d  gestational weeks via spontaneous vaginal delivery (SVD). Anesthesia: general.  Delivery complications: postpartum hemorrhage  Patient describes her labor and delivery as  Good experience..   Koren Cooks is doing well doing good. Baby is feeding by both breast and bottle - Similac . She does not have concerns regarding how to return to work and continue breast feeding.   GI &  GU Patient had 1st degree laceration at delivery.  Bleeding: red and changing a thin pad 2 times a day.  Bowel function is normal.  Bladder function is normal.  Any urinary incontinence? No.   Sexuality and Contraception Patient is not sexually active.  She has not experienced a menstrual period after delivery. LMP No LMP recorded.. Patient does not want a pregnancy in the next year.   Desired family size is 4 children.  Current contraception method is tubal ligation.  Desired contraception method is tubal ligation. - has already had Tubal ligation procedure.  The pregnancy intention screening data noted above was reviewed. Potential methods of contraception were discussed. The patient elected to proceed with Female Sterilization.  Mood Postpartum depression screening: positive. Flowsheet Row Routine Prenatal from [facility]/08/2019 in [facility] Department  PHQ-9 Total Score 2    Edinburgh Postnatal Depression Scale - 03/20/24 1516       Edinburgh Postnatal Depression Scale:  In the Past 7 Days   I have been able to laugh and see the funny side of things. 1    I have looked forward with enjoyment to things. 1    I have blamed myself unnecessarily when things went wrong. 1    I have been anxious or worried for no good reason. 2    I have felt scared or panicky for no good reason. 0    Things have been getting on top of me. 2    I have been so unhappy that I have had difficulty sleeping. 1    I have felt sad or miserable. 1    I have been so unhappy that I have been  crying. 1    The thought of harming myself has occurred to me. 0    Edinburgh Postnatal Depression Scale Total 10          Health Maintenance Health Maintenance Due  Topic Date Due   Hepatitis B Vaccines 19-59 Average Risk (1 of 3 - 19+ 3-dose series) Never done   COVID-19 Vaccine (1 - 2025-26 season) Never done   The following portions of the patient's history were reviewed and updated as appropriate:  allergies, current medications, past family history, past medical history, past social history, past surgical history, and problem list.  Review of Systems Pertinent items noted in HPI and remainder of comprehensive ROS otherwise negative.  Objective:  BP 109/61 (BP Location: Left Arm, Patient Position: Sitting, Cuff Size: Normal)   Pulse 72   Wt 153 lb 12.8 oz (69.8 kg)   Breastfeeding Yes   BMI 30.04 kg/m    General:  alert and cooperative   Breasts:  normal  Lungs: clear to auscultation bilaterally  Heart:  regular rate and rhythm, S1, S2 normal, no murmur, click, rub or gallop  Abdomen: soft, non-tender; bowel sounds normal; no masses,  no organomegaly   Wound N/A  GU exam:  Patient Declined    Chaperone: Leita Bolognese, [facility] Assessment and Plan:    1. Encounter for postpartum visit -EPDS:10, positive, offered [facility] counseling services.  Abuse screen and 5 P's negative. Discussed with patient adequate and safe pregnancy interval spacing of 18 months. May resume sexual intercourse.  -Discussed utilizing miralax for constipation.  - Can use tucks pads, and epsom salt for hemorrhoid discomfort as relief measure. -Discussed that the episode of 2-3 days of bleeding was likely her first menstrual period since giving birth discussed this is a normal. She is partially breastfeeding so it is more likely to have an earlier return of menstruation.  -Discussed the temporary breast discomfort was likely a clogged duct as the ibuprofen  helped with the inflammation and swelling. Advised to continue breastfeeding and watch for any further signs of breast discomfort. Can continue to take Ibuprofen  PRN.  -Hgb: 12.4 WNL.   - Hemoglobin, fingerstick  2. Positive depression screening -EPDS:10, positive offered [facility] counseling services pt accepted.  - Ambulatory referral to Behavioral Health   3. Family planning (Primary) -CBE conducted during today's visit, WNL.  Essential components of care  per ACOG recommendations:  1.  Mood and well being  Social determinants of health:  - Patient with positive depression screening today. Reviewed local resources for support.  - Patient tobacco use? No.   - hx of drug use? No.   - Social determinants of health (SDOH) reviewed in EPIC. No concerns, has adequate support system from sisters.   2. Sexuality, contraception and birth spacing  Emergency Contraception Precautions (ECP): Patient assessed for need of ECP. She is not a candidate based on no intercourse since delivery.  Educated on ECP and reviewed options.  Patient desires no method - patient politely declines any emergency contraception.    Reviewed options based on patient desire and reproductive life plan. Patient is interested in No Method - Other Reason. This was not provided to the patient today. Pt had tubal ligation procedure.    Risks, benefits, and typical effectiveness rates were reviewed.  Questions were answered.  Written information was also given to the patient to review.    The patient will follow up in  1 years for surveillance.  The patient was told to call with any  further questions, or with any concerns about this method of contraception.  Emphasized use of condoms 100% of the time for STI prevention.  Discussed recommended birth spacing of 18 months.  3. Sleep and fatigue -Encouraged family/partner/community support of 4 hrs of uninterrupted sleep to help with mood and fatigue  4. Physical Recovery  - Discussed patients delivery and complications. - Perineal healing reviewed. Patient expressed understanding - Patient is safe to resume physical and sexual activity  5.  Health Maintenance - HM due items addressed No - Not indicated at this time.  - Last pap smear:  No results found for: DIAGPAP, HPVHIGH, ADEQPAP Lab Results  Component Value Date   SPECADGYN Comment 07/02/2023   Result Date Procedure Results Follow-ups  07/02/2023 IGP, Aptima HPV  DIAGNOSIS:: Comment (A) Specimen adequacy:: Comment Clinician Provided ICD10: Comment Performed by:: Comment Electronically signed by:: Comment PAP Smear Comment: . PATHOLOGIST PROVIDED ICD10:: Comment Note:: Comment Test Methodology: Comment HPV Aptima: Negative   09/24/2019 IGP, rfx Aptima HPV ASCU DIAGNOSIS:: Comment Specimen adequacy:: Comment Clinician Provided ICD10: Comment Performed by:: Comment PAP Smear Comment: . Note:: Comment Test Methodology: Comment PAP Reflex: Comment   03/26/2016 Pap IG (Image Guided) Pap Smear: NILM due 03/28/2019   - Pap smear not done at today's visit.  - Breast Cancer screening indicated? No.   6. Chronic Disease/Pregnancy Condition follow up: Anemia - PCP follow up  Zariel Capano GORMAN Pouch, NP [facility] Department "

## 2024-03-20 NOTE — Telephone Encounter (Signed)
 Kept post-partum appt in [facility] 03/20/24. Burnadette Lowers, RN
